# Patient Record
Sex: Female | Born: 1980 | Race: Black or African American | Hispanic: No | Marital: Single | Smoking: Current every day smoker
Health system: Southern US, Community
[De-identification: ages and names within clinical notes are randomized; demographics above are authoritative.]

## PROBLEM LIST (undated history)

## (undated) DIAGNOSIS — R279 Unspecified lack of coordination: Secondary | ICD-10-CM

## (undated) DIAGNOSIS — M48061 Spinal stenosis, lumbar region without neurogenic claudication: Secondary | ICD-10-CM

## (undated) DIAGNOSIS — F329 Major depressive disorder, single episode, unspecified: Secondary | ICD-10-CM

## (undated) DIAGNOSIS — K219 Gastro-esophageal reflux disease without esophagitis: Secondary | ICD-10-CM

## (undated) DIAGNOSIS — J984 Other disorders of lung: Secondary | ICD-10-CM

## (undated) DIAGNOSIS — R209 Unspecified disturbances of skin sensation: Secondary | ICD-10-CM

## (undated) DIAGNOSIS — G4730 Sleep apnea, unspecified: Secondary | ICD-10-CM

## (undated) DIAGNOSIS — F4310 Post-traumatic stress disorder, unspecified: Secondary | ICD-10-CM

## (undated) DIAGNOSIS — M79609 Pain in unspecified limb: Secondary | ICD-10-CM

## (undated) DIAGNOSIS — M797 Fibromyalgia: Secondary | ICD-10-CM

## (undated) DIAGNOSIS — I10 Essential (primary) hypertension: Secondary | ICD-10-CM

## (undated) DIAGNOSIS — M76899 Other specified enthesopathies of unspecified lower limb, excluding foot: Secondary | ICD-10-CM

## (undated) DIAGNOSIS — M25569 Pain in unspecified knee: Secondary | ICD-10-CM

## (undated) DIAGNOSIS — M1990 Unspecified osteoarthritis, unspecified site: Secondary | ICD-10-CM

## (undated) DIAGNOSIS — M545 Low back pain, unspecified: Secondary | ICD-10-CM

## (undated) DIAGNOSIS — J45909 Unspecified asthma, uncomplicated: Secondary | ICD-10-CM

## (undated) DIAGNOSIS — R0602 Shortness of breath: Secondary | ICD-10-CM

## (undated) DIAGNOSIS — G894 Chronic pain syndrome: Secondary | ICD-10-CM

## (undated) DIAGNOSIS — C801 Malignant (primary) neoplasm, unspecified: Secondary | ICD-10-CM

## (undated) DIAGNOSIS — E119 Type 2 diabetes mellitus without complications: Secondary | ICD-10-CM

## (undated) DIAGNOSIS — J449 Chronic obstructive pulmonary disease, unspecified: Secondary | ICD-10-CM

## (undated) DIAGNOSIS — F32A Depression, unspecified: Secondary | ICD-10-CM

## (undated) HISTORY — DX: Malignant (primary) neoplasm, unspecified: C80.1

## (undated) HISTORY — PX: MOUTH SURGERY: SHX715

## (undated) HISTORY — PX: DILATION AND CURETTAGE OF UTERUS: SHX78

## (undated) HISTORY — DX: Gastro-esophageal reflux disease without esophagitis: K21.9

## (undated) HISTORY — DX: Post-traumatic stress disorder, unspecified: F43.10

## (undated) HISTORY — DX: Sleep apnea, unspecified: G47.30

## (undated) HISTORY — DX: Fibromyalgia: M79.7

## (undated) HISTORY — DX: Pain in unspecified limb: M79.609

## (undated) HISTORY — DX: Unspecified osteoarthritis, unspecified site: M19.90

## (undated) HISTORY — DX: Essential (primary) hypertension: I10

## (undated) HISTORY — DX: Chronic pain syndrome: G89.4

## (undated) HISTORY — PX: ABDOMINAL HYSTERECTOMY: SHX81

## (undated) HISTORY — DX: Spinal stenosis, lumbar region without neurogenic claudication: M48.061

## (undated) HISTORY — DX: Pain in unspecified knee: M25.569

## (undated) HISTORY — DX: Low back pain, unspecified: M54.50

## (undated) HISTORY — DX: Major depressive disorder, single episode, unspecified: F32.9

## (undated) HISTORY — DX: Other disorders of lung: J98.4

## (undated) HISTORY — DX: Type 2 diabetes mellitus without complications: E11.9

## (undated) HISTORY — PX: OTHER SURGICAL HISTORY: SHX169

## (undated) HISTORY — DX: Unspecified lack of coordination: R27.9

## (undated) HISTORY — DX: Other specified enthesopathies of unspecified lower limb, excluding foot: M76.899

## (undated) HISTORY — DX: Depression, unspecified: F32.A

## (undated) HISTORY — DX: Unspecified disturbances of skin sensation: R20.9

## (undated) HISTORY — DX: Low back pain: M54.5

---

## 1999-02-10 ENCOUNTER — Encounter: Payer: Self-pay | Admitting: *Deleted

## 1999-02-10 ENCOUNTER — Emergency Department (HOSPITAL_COMMUNITY): Admission: EM | Admit: 1999-02-10 | Discharge: 1999-02-10 | Payer: Self-pay | Admitting: Emergency Medicine

## 2001-09-16 ENCOUNTER — Ambulatory Visit (HOSPITAL_COMMUNITY): Admission: RE | Admit: 2001-09-16 | Discharge: 2001-09-16 | Payer: Self-pay | Admitting: Internal Medicine

## 2001-09-16 ENCOUNTER — Encounter: Payer: Self-pay | Admitting: Internal Medicine

## 2001-11-05 ENCOUNTER — Emergency Department (HOSPITAL_COMMUNITY): Admission: EM | Admit: 2001-11-05 | Discharge: 2001-11-06 | Payer: Self-pay | Admitting: *Deleted

## 2005-04-15 ENCOUNTER — Emergency Department (HOSPITAL_COMMUNITY): Admission: EM | Admit: 2005-04-15 | Discharge: 2005-04-15 | Payer: Self-pay | Admitting: Emergency Medicine

## 2007-09-30 ENCOUNTER — Emergency Department (HOSPITAL_COMMUNITY): Admission: EM | Admit: 2007-09-30 | Discharge: 2007-09-30 | Payer: Self-pay | Admitting: Emergency Medicine

## 2007-11-30 ENCOUNTER — Emergency Department (HOSPITAL_COMMUNITY): Admission: EM | Admit: 2007-11-30 | Discharge: 2007-11-30 | Payer: Self-pay | Admitting: Emergency Medicine

## 2007-12-01 ENCOUNTER — Emergency Department (HOSPITAL_COMMUNITY): Admission: EM | Admit: 2007-12-01 | Discharge: 2007-12-01 | Payer: Self-pay | Admitting: Emergency Medicine

## 2008-10-05 ENCOUNTER — Emergency Department (HOSPITAL_COMMUNITY): Admission: EM | Admit: 2008-10-05 | Discharge: 2008-10-05 | Payer: Self-pay | Admitting: Emergency Medicine

## 2008-12-21 ENCOUNTER — Ambulatory Visit (HOSPITAL_COMMUNITY): Admission: RE | Admit: 2008-12-21 | Discharge: 2008-12-21 | Payer: Self-pay | Admitting: Anesthesiology

## 2008-12-22 ENCOUNTER — Ambulatory Visit (HOSPITAL_COMMUNITY): Admission: RE | Admit: 2008-12-22 | Discharge: 2008-12-22 | Payer: Self-pay | Admitting: Obstetrics and Gynecology

## 2008-12-22 ENCOUNTER — Encounter (INDEPENDENT_AMBULATORY_CARE_PROVIDER_SITE_OTHER): Payer: Self-pay | Admitting: Obstetrics and Gynecology

## 2008-12-30 ENCOUNTER — Ambulatory Visit: Payer: Self-pay | Admitting: Cardiology

## 2008-12-30 ENCOUNTER — Inpatient Hospital Stay (HOSPITAL_COMMUNITY): Admission: EM | Admit: 2008-12-30 | Discharge: 2009-01-01 | Payer: Self-pay | Admitting: Emergency Medicine

## 2009-01-01 ENCOUNTER — Encounter (INDEPENDENT_AMBULATORY_CARE_PROVIDER_SITE_OTHER): Payer: Self-pay | Admitting: Internal Medicine

## 2009-01-19 ENCOUNTER — Ambulatory Visit: Admission: RE | Admit: 2009-01-19 | Discharge: 2009-01-19 | Payer: Self-pay | Admitting: Gynecology

## 2009-01-26 ENCOUNTER — Emergency Department (HOSPITAL_COMMUNITY): Admission: EM | Admit: 2009-01-26 | Discharge: 2009-01-26 | Payer: Self-pay | Admitting: Emergency Medicine

## 2009-01-29 DIAGNOSIS — R079 Chest pain, unspecified: Secondary | ICD-10-CM

## 2009-01-29 DIAGNOSIS — Z87898 Personal history of other specified conditions: Secondary | ICD-10-CM | POA: Insufficient documentation

## 2009-01-29 DIAGNOSIS — I10 Essential (primary) hypertension: Secondary | ICD-10-CM | POA: Insufficient documentation

## 2009-02-10 ENCOUNTER — Ambulatory Visit: Admission: RE | Admit: 2009-02-10 | Discharge: 2009-02-10 | Payer: Self-pay | Admitting: Internal Medicine

## 2009-02-20 ENCOUNTER — Ambulatory Visit: Payer: Self-pay | Admitting: Cardiology

## 2009-02-20 DIAGNOSIS — G4710 Hypersomnia, unspecified: Secondary | ICD-10-CM | POA: Insufficient documentation

## 2009-02-20 DIAGNOSIS — G4730 Sleep apnea, unspecified: Secondary | ICD-10-CM

## 2009-03-05 ENCOUNTER — Ambulatory Visit (HOSPITAL_COMMUNITY): Admission: RE | Admit: 2009-03-05 | Discharge: 2009-03-05 | Payer: Self-pay | Admitting: Internal Medicine

## 2009-03-26 ENCOUNTER — Encounter (HOSPITAL_COMMUNITY): Admission: RE | Admit: 2009-03-26 | Discharge: 2009-04-25 | Payer: Self-pay | Admitting: Orthopaedic Surgery

## 2009-04-26 ENCOUNTER — Encounter (HOSPITAL_COMMUNITY): Admission: RE | Admit: 2009-04-26 | Discharge: 2009-05-26 | Payer: Self-pay | Admitting: Orthopaedic Surgery

## 2009-05-11 ENCOUNTER — Encounter (INDEPENDENT_AMBULATORY_CARE_PROVIDER_SITE_OTHER): Payer: Self-pay | Admitting: *Deleted

## 2009-05-11 LAB — CONVERTED CEMR LAB
HDL: 44 mg/dL
Triglycerides: 202 mg/dL

## 2009-05-30 ENCOUNTER — Encounter (HOSPITAL_COMMUNITY): Admission: RE | Admit: 2009-05-30 | Discharge: 2009-06-15 | Payer: Self-pay | Admitting: Orthopaedic Surgery

## 2009-08-08 ENCOUNTER — Emergency Department (HOSPITAL_COMMUNITY): Admission: EM | Admit: 2009-08-08 | Discharge: 2009-08-08 | Payer: Self-pay | Admitting: Emergency Medicine

## 2009-09-14 DIAGNOSIS — C801 Malignant (primary) neoplasm, unspecified: Secondary | ICD-10-CM

## 2009-09-14 HISTORY — DX: Malignant (primary) neoplasm, unspecified: C80.1

## 2009-09-25 ENCOUNTER — Encounter (INDEPENDENT_AMBULATORY_CARE_PROVIDER_SITE_OTHER): Payer: Self-pay | Admitting: *Deleted

## 2009-10-18 ENCOUNTER — Emergency Department (HOSPITAL_COMMUNITY): Admission: EM | Admit: 2009-10-18 | Discharge: 2009-10-19 | Payer: Self-pay | Admitting: Emergency Medicine

## 2009-11-01 ENCOUNTER — Ambulatory Visit (HOSPITAL_COMMUNITY): Payer: Self-pay | Admitting: Psychiatry

## 2009-11-14 ENCOUNTER — Ambulatory Visit (HOSPITAL_COMMUNITY): Payer: Self-pay | Admitting: Psychiatry

## 2009-11-19 ENCOUNTER — Ambulatory Visit (HOSPITAL_COMMUNITY): Payer: Self-pay | Admitting: Psychiatry

## 2009-11-22 ENCOUNTER — Ambulatory Visit (HOSPITAL_COMMUNITY): Payer: Self-pay | Admitting: Psychiatry

## 2009-11-26 ENCOUNTER — Ambulatory Visit (HOSPITAL_COMMUNITY): Payer: Self-pay | Admitting: Psychiatry

## 2009-12-04 ENCOUNTER — Ambulatory Visit (HOSPITAL_COMMUNITY): Payer: Self-pay | Admitting: Psychiatry

## 2009-12-13 ENCOUNTER — Encounter (INDEPENDENT_AMBULATORY_CARE_PROVIDER_SITE_OTHER): Payer: Self-pay | Admitting: *Deleted

## 2009-12-13 LAB — CONVERTED CEMR LAB
ALT: 18 units/L
AST: 20 units/L
Albumin: 3.9 g/dL
Alkaline Phosphatase: 50 units/L
Chloride: 99 meq/L
Cholesterol: 270 mg/dL
Creatinine, Ser: 0.85 mg/dL

## 2009-12-19 ENCOUNTER — Ambulatory Visit (HOSPITAL_COMMUNITY): Payer: Self-pay | Admitting: Psychiatry

## 2009-12-20 ENCOUNTER — Ambulatory Visit (HOSPITAL_COMMUNITY): Payer: Self-pay | Admitting: Psychiatry

## 2009-12-21 ENCOUNTER — Encounter (INDEPENDENT_AMBULATORY_CARE_PROVIDER_SITE_OTHER): Payer: Self-pay | Admitting: *Deleted

## 2009-12-28 ENCOUNTER — Ambulatory Visit (HOSPITAL_COMMUNITY): Payer: Self-pay | Admitting: Psychiatry

## 2009-12-31 ENCOUNTER — Ambulatory Visit (HOSPITAL_COMMUNITY): Payer: Self-pay | Admitting: Psychiatry

## 2010-01-05 ENCOUNTER — Emergency Department (HOSPITAL_COMMUNITY): Admission: EM | Admit: 2010-01-05 | Discharge: 2010-01-06 | Payer: Self-pay | Admitting: Emergency Medicine

## 2010-01-08 ENCOUNTER — Ambulatory Visit: Payer: Self-pay | Admitting: Cardiology

## 2010-01-08 ENCOUNTER — Ambulatory Visit (HOSPITAL_COMMUNITY): Admission: RE | Admit: 2010-01-08 | Discharge: 2010-01-08 | Payer: Self-pay | Admitting: Cardiology

## 2010-01-08 DIAGNOSIS — C55 Malignant neoplasm of uterus, part unspecified: Secondary | ICD-10-CM

## 2010-01-08 DIAGNOSIS — M542 Cervicalgia: Secondary | ICD-10-CM | POA: Insufficient documentation

## 2010-01-08 DIAGNOSIS — IMO0001 Reserved for inherently not codable concepts without codable children: Secondary | ICD-10-CM | POA: Insufficient documentation

## 2010-01-09 ENCOUNTER — Telehealth (INDEPENDENT_AMBULATORY_CARE_PROVIDER_SITE_OTHER): Payer: Self-pay | Admitting: *Deleted

## 2010-01-09 ENCOUNTER — Encounter: Payer: Self-pay | Admitting: Cardiology

## 2010-01-14 ENCOUNTER — Encounter
Admission: RE | Admit: 2010-01-14 | Discharge: 2010-04-14 | Payer: Self-pay | Admitting: Physical Medicine and Rehabilitation

## 2010-01-16 ENCOUNTER — Ambulatory Visit (HOSPITAL_COMMUNITY): Payer: Self-pay | Admitting: Psychiatry

## 2010-01-17 ENCOUNTER — Ambulatory Visit (HOSPITAL_COMMUNITY): Payer: Self-pay | Admitting: Psychiatry

## 2010-01-21 ENCOUNTER — Ambulatory Visit: Payer: Self-pay | Admitting: Physical Medicine and Rehabilitation

## 2010-01-23 ENCOUNTER — Encounter (INDEPENDENT_AMBULATORY_CARE_PROVIDER_SITE_OTHER): Payer: Self-pay | Admitting: *Deleted

## 2010-01-23 ENCOUNTER — Emergency Department (HOSPITAL_COMMUNITY): Admission: EM | Admit: 2010-01-23 | Discharge: 2010-01-23 | Payer: Self-pay | Admitting: Emergency Medicine

## 2010-01-23 ENCOUNTER — Telehealth (INDEPENDENT_AMBULATORY_CARE_PROVIDER_SITE_OTHER): Payer: Self-pay | Admitting: *Deleted

## 2010-01-23 LAB — CONVERTED CEMR LAB
BUN: 7 mg/dL
CK-MB: <1 ng/mL
CO2: 32 meq/L
Calcium: 0.92 mg/dL
Glomerular Filtration Rate, Af Am: >60 mL/min/{1.73_m2}
Glucose, Bld: 141 mg/dL
MCV: 87.1 fL
Platelets: 359 10*3/uL
Potassium: 3.5 meq/L
Sodium: 139 meq/L
Troponin I: <0.05 ng/mL

## 2010-01-24 ENCOUNTER — Ambulatory Visit (HOSPITAL_COMMUNITY): Payer: Self-pay | Admitting: Psychiatry

## 2010-01-29 ENCOUNTER — Ambulatory Visit (HOSPITAL_COMMUNITY): Admission: RE | Admit: 2010-01-29 | Discharge: 2010-01-29 | Payer: Self-pay

## 2010-01-31 ENCOUNTER — Ambulatory Visit (HOSPITAL_COMMUNITY): Payer: Self-pay | Admitting: Psychiatry

## 2010-02-01 ENCOUNTER — Ambulatory Visit (HOSPITAL_COMMUNITY): Payer: Self-pay | Admitting: Psychiatry

## 2010-02-05 ENCOUNTER — Encounter: Admission: RE | Admit: 2010-02-05 | Discharge: 2010-05-06 | Payer: Self-pay | Admitting: Internal Medicine

## 2010-02-12 ENCOUNTER — Ambulatory Visit (HOSPITAL_COMMUNITY): Payer: Self-pay | Admitting: Psychiatry

## 2010-02-13 ENCOUNTER — Encounter (INDEPENDENT_AMBULATORY_CARE_PROVIDER_SITE_OTHER): Payer: Self-pay | Admitting: *Deleted

## 2010-02-15 ENCOUNTER — Ambulatory Visit: Payer: Self-pay | Admitting: Physical Medicine and Rehabilitation

## 2010-02-26 ENCOUNTER — Ambulatory Visit (HOSPITAL_COMMUNITY): Payer: Self-pay | Admitting: Psychiatry

## 2010-02-28 ENCOUNTER — Ambulatory Visit (HOSPITAL_COMMUNITY): Payer: Self-pay | Admitting: Psychiatry

## 2010-03-05 ENCOUNTER — Encounter (INDEPENDENT_AMBULATORY_CARE_PROVIDER_SITE_OTHER): Payer: Self-pay | Admitting: *Deleted

## 2010-03-05 LAB — CONVERTED CEMR LAB: Hgb A1c MFr Bld: 6.4 %

## 2010-03-06 ENCOUNTER — Ambulatory Visit (HOSPITAL_COMMUNITY): Payer: Self-pay | Admitting: Psychiatry

## 2010-03-07 ENCOUNTER — Ambulatory Visit (HOSPITAL_COMMUNITY): Admission: RE | Admit: 2010-03-07 | Discharge: 2010-03-07 | Payer: Self-pay | Admitting: Internal Medicine

## 2010-03-12 ENCOUNTER — Observation Stay (HOSPITAL_COMMUNITY): Admission: EM | Admit: 2010-03-12 | Discharge: 2010-03-13 | Payer: Self-pay | Admitting: Emergency Medicine

## 2010-03-12 ENCOUNTER — Ambulatory Visit: Payer: Self-pay | Admitting: Cardiology

## 2010-03-13 ENCOUNTER — Ambulatory Visit: Payer: Self-pay | Admitting: Vascular Surgery

## 2010-03-13 ENCOUNTER — Encounter (INDEPENDENT_AMBULATORY_CARE_PROVIDER_SITE_OTHER): Payer: Self-pay | Admitting: Internal Medicine

## 2010-03-13 LAB — CONVERTED CEMR LAB: Hgb A1c MFr Bld: 6.8 %

## 2010-03-21 ENCOUNTER — Ambulatory Visit (HOSPITAL_COMMUNITY): Payer: Self-pay | Admitting: Psychiatry

## 2010-03-26 ENCOUNTER — Ambulatory Visit: Payer: Self-pay | Admitting: Cardiology

## 2010-03-26 ENCOUNTER — Ambulatory Visit (HOSPITAL_COMMUNITY): Payer: Self-pay | Admitting: Psychiatry

## 2010-03-26 DIAGNOSIS — Z87891 Personal history of nicotine dependence: Secondary | ICD-10-CM

## 2010-03-28 ENCOUNTER — Ambulatory Visit (HOSPITAL_COMMUNITY): Payer: Self-pay | Admitting: Psychiatry

## 2010-04-02 ENCOUNTER — Ambulatory Visit (HOSPITAL_COMMUNITY): Admission: RE | Admit: 2010-04-02 | Discharge: 2010-04-02 | Payer: Self-pay | Admitting: Obstetrics and Gynecology

## 2010-04-03 ENCOUNTER — Ambulatory Visit: Payer: Self-pay | Admitting: Physical Medicine and Rehabilitation

## 2010-04-04 ENCOUNTER — Ambulatory Visit (HOSPITAL_COMMUNITY): Payer: Self-pay | Admitting: Psychiatry

## 2010-04-11 ENCOUNTER — Ambulatory Visit (HOSPITAL_COMMUNITY)
Admission: RE | Admit: 2010-04-11 | Discharge: 2010-04-11 | Payer: Self-pay | Admitting: Physical Medicine and Rehabilitation

## 2010-04-26 ENCOUNTER — Ambulatory Visit (HOSPITAL_COMMUNITY): Payer: Self-pay | Admitting: Psychiatry

## 2010-04-26 ENCOUNTER — Encounter
Admission: RE | Admit: 2010-04-26 | Discharge: 2010-06-13 | Payer: Self-pay | Source: Home / Self Care | Attending: Physical Medicine and Rehabilitation | Admitting: Physical Medicine and Rehabilitation

## 2010-05-06 ENCOUNTER — Ambulatory Visit: Payer: Self-pay | Admitting: Physical Medicine and Rehabilitation

## 2010-05-13 ENCOUNTER — Ambulatory Visit (HOSPITAL_COMMUNITY): Payer: Self-pay | Admitting: Psychiatry

## 2010-05-23 ENCOUNTER — Ambulatory Visit (HOSPITAL_COMMUNITY): Payer: Self-pay | Admitting: Psychiatry

## 2010-05-27 ENCOUNTER — Ambulatory Visit (HOSPITAL_COMMUNITY): Payer: Self-pay | Admitting: Psychiatry

## 2010-05-29 ENCOUNTER — Encounter (INDEPENDENT_AMBULATORY_CARE_PROVIDER_SITE_OTHER): Payer: Self-pay | Admitting: *Deleted

## 2010-06-03 ENCOUNTER — Ambulatory Visit (HOSPITAL_COMMUNITY): Payer: Self-pay | Admitting: Psychiatry

## 2010-06-03 ENCOUNTER — Ambulatory Visit: Payer: Self-pay | Admitting: Cardiology

## 2010-06-12 ENCOUNTER — Ambulatory Visit (HOSPITAL_COMMUNITY): Payer: Self-pay | Admitting: Psychiatry

## 2010-06-18 ENCOUNTER — Ambulatory Visit (HOSPITAL_COMMUNITY)
Admission: RE | Admit: 2010-06-18 | Discharge: 2010-06-18 | Payer: Self-pay | Source: Home / Self Care | Attending: Psychiatry | Admitting: Psychiatry

## 2010-06-18 ENCOUNTER — Encounter
Admission: RE | Admit: 2010-06-18 | Discharge: 2010-06-21 | Payer: Self-pay | Source: Home / Self Care | Attending: Physical Medicine and Rehabilitation | Admitting: Physical Medicine and Rehabilitation

## 2010-06-19 ENCOUNTER — Encounter: Admit: 2010-06-19 | Payer: Self-pay | Admitting: Internal Medicine

## 2010-06-21 ENCOUNTER — Ambulatory Visit
Admission: RE | Admit: 2010-06-21 | Discharge: 2010-06-21 | Payer: Self-pay | Source: Home / Self Care | Attending: Physical Medicine and Rehabilitation | Admitting: Physical Medicine and Rehabilitation

## 2010-06-26 ENCOUNTER — Ambulatory Visit (HOSPITAL_COMMUNITY): Admit: 2010-06-26 | Payer: Self-pay | Admitting: Psychiatry

## 2010-07-03 ENCOUNTER — Ambulatory Visit (HOSPITAL_COMMUNITY)
Admission: RE | Admit: 2010-07-03 | Discharge: 2010-07-03 | Payer: Self-pay | Source: Home / Self Care | Attending: Psychiatry | Admitting: Psychiatry

## 2010-07-08 LAB — POCT I-STAT, CHEM 8
Chloride: 99 mEq/L (ref 96–112)
Potassium: 3.3 mEq/L — ABNORMAL LOW (ref 3.5–5.1)
Sodium: 140 mEq/L (ref 135–145)
TCO2: 32 mmol/L (ref 0–100)

## 2010-07-09 ENCOUNTER — Ambulatory Visit
Admission: RE | Admit: 2010-07-09 | Discharge: 2010-07-09 | Payer: Self-pay | Source: Home / Self Care | Attending: Orthopedic Surgery | Admitting: Orthopedic Surgery

## 2010-07-10 ENCOUNTER — Ambulatory Visit (HOSPITAL_COMMUNITY)
Admission: RE | Admit: 2010-07-10 | Discharge: 2010-07-10 | Payer: Self-pay | Source: Home / Self Care | Attending: Psychiatry | Admitting: Psychiatry

## 2010-07-16 ENCOUNTER — Ambulatory Visit (HOSPITAL_COMMUNITY)
Admission: RE | Admit: 2010-07-16 | Discharge: 2010-07-16 | Payer: Self-pay | Source: Home / Self Care | Attending: Psychiatry | Admitting: Psychiatry

## 2010-07-16 NOTE — Progress Notes (Signed)
Summary: TEST RESULTS  Phone Note Call from Patient Call back at [phone]   Caller: PT Reason for Call: Lab or Test Results Summary of Call: PT WAS CALLING JUST TO LET STAFF KNOW WHEN THEY CALL WITH TEST RESULT TO PLEASE CALL CELL NUMBER [phone]. Initial call taken by: Faythe Ghee,  January 09, 2010 9:22 AM  Follow-up for Phone Call        I called results of cervical xrays to pt, verbalized understanding Follow-up by: Teressa Lower RN,  January 10, 2010 10:49 AM

## 2010-07-16 NOTE — Miscellaneous (Signed)
Summary: cmp,lipids per Dr. Ouida Sills  Clinical Lists Changes  Observations: Added new observation of CALCIUM: 9.7 mg/dL (16/03/9603 54:09) Added new observation of ALBUMIN: 3.9 g/dL (81/19/1478 29:56) Added new observation of PROTEIN, TOT: 7.1 g/dL (21/30/8657 84:69) Added new observation of SGPT (ALT): 18 units/L (12/13/2009 14:51) Added new observation of SGOT (AST): 20 units/L (12/13/2009 14:51) Added new observation of ALK PHOS: 50 units/L (12/13/2009 14:51) Added new observation of BILI DIRECT: total bili  0.5 mg/dL (62/95/2841 32:44) Added new observation of CREATININE: 0.85 mg/dL (06/18/7251 66:44) Added new observation of BUN: 14 mg/dL (03/47/4259 56:38) Added new observation of BG RANDOM: 133 mg/dL (75/64/3329 51:88) Added new observation of CO2 PLSM/SER: 28 meq/L (12/13/2009 14:51) Added new observation of CL SERUM: 99 meq/L (12/13/2009 14:51) Added new observation of K SERUM: 4.0 meq/L (12/13/2009 14:51) Added new observation of NA: 141 meq/L (12/13/2009 14:51) Added new observation of LDL: 183 mg/dL (41/66/0630 16:01) Added new observation of HDL: 40 mg/dL (09/32/3557 32:20) Added new observation of TRIGLYC TOT: 233 mg/dL (25/42/7062 37:62) Added new observation of CHOLESTEROL: 270 mg/dL (83/15/1761 60:73)

## 2010-07-16 NOTE — Assessment & Plan Note (Signed)
Summary: rov   Visit Type:  Follow-up Primary Provider:  Osborne Casco   History of Present Illness: [name] returns to the office for continuing assessment and treatment of chest pain.  She has noted some improvement with sublingual nitroglycerin, but presented to the [facility] Emergency Department one evening while visiting a hospitalized patient.  She had been experiencing chest discomfort on and off all day that was responsive to nitroglycerin and decided that while she was in the hospital she would have it checked out.  Hospital records were obtained and reviewed.  Deep vein thrombosis, pulmonary embolism and acute MI were ruled out, and she was discharged within 24 hours.  -  Date:  03/13/2010    Cholesterol: 202    LDL: 140    HDL: 38    Triglycerides: 528    HgbA1c: 6.8    TSH: 1.4   Current Medications (verified): 1)  Chlorthalidone 25 Mg Tabs (Chlorthalidone) .... Take 1 Tab Daily 2)  Metoprolol Tartrate 50 Mg Tabs (Metoprolol Tartrate) .... Twice A Day 3)  Fluoxetine Hcl 40 Mg Caps (Fluoxetine Hcl) .... Take 1 Tab Daily 4)  Klor-Con M20 20 Meq Cr-Tabs (Potassium Chloride Crys Cr) .... Take 1 Tab Two Times A Day 5)  Abilify 10 Mg Tabs (Aripiprazole) .... Take 1 1/2 Tabs Daily 6)  Pravastatin Sodium 20 Mg Tabs (Pravastatin Sodium) .... Take 1 Tab Daily 7)  Nitrostat 0.4 Mg Subl (Nitroglycerin) .Marland Kitchen.. 1 Tablet Under Tongue At Onset of Chest Pain; You May Repeat Every 5 Minutes For Up To 3 Doses. 8)  Premarin 0.625 Mg Tabs (Estrogens Conjugated) .... Take 1 Tab Daily 9)  Nitroglycerin 0.4 Mg/hr Pt24 (Nitroglycerin) .... Appy Every Am and Remove Every Pm  Allergies (verified): 1)  ! Codeine 2)  ! * Noraflex  Comments:  Nurse/Medical Assistant: patient brought meds and reviewed previous med list from  last ov also abilify has been changed from 5 mg to 15mg  per Dr.Roy fagan also potassium is 2 tabs daily  per fagan   Past History:  PMH, FH, and Social History  reviewed and updated.  Review of Systems       See HPI.  Vital Signs:  Patient profile:   30 year old female Weight:      332 pounds BMI:     60.94 Pulse rate:   77 / minute BP sitting:   117 / 81  (right arm)  Vitals Entered By: Dreama Saa, CNA (March 26, 2010 2:44 PM)  Physical Exam  General:  Obese; well developed; no acute distress:   Neck-No JVD; no carotid bruits: Lungs-No tachypnea, no rales; no rhonchi; no wheezes: Cardiovascular-distant S1 and S2; minimal systolic murmur Abdomen-BS normal; soft and non-tender without masses or organomegaly:  Musculoskeletal-No deformities, no cyanosis or clubbing: Neurologic-Normal cranial nerves; symmetric strength and tone:  Skin-Warm, no significant lesions: Extremities-Nl distal pulses; no edema:     Impression & Recommendations:  Problem # 1:  CHEST PAIN (ICD-786.50) Symptoms are somewhat improved with nitrates, although she still ended up in hospital.  D-dimer was 0.6 with a normal CT angiogram of the chest.  Cardiac markers and EKGs were negative.  She noted excellent relief when transderma lNTG was applied at [facility].  This medicine will be added to her regime at a dose of 0.4 mg per hour with the system to be applied in the morning and removed at night.  She will be permitted to use sublingual nitroglycerin as well.  Problem # 2:  HYPERLIPIDEMIA, MILD, HX OF (ICD-V13.8) Lipid profile in the hospital showed fairly good, if not optimal, control of hyperlipidemia.  Current therapy will be continued.  Problem # 3:  HYPERTENSION (ICD-401.9) Blood pressure control is good with her current simple 2 drug regimen, which will be continued.  I will reassess this nice woman in 2 months.  Problem # 4:  TOBACCO ABUSE (ICD-305.1) She was given a transdermal nicotine patch in the hospital, and has continued that while refraining from smoking over the past few days.  She is encouraged to continue this.  Patient  Instructions: 1)  Your physician recommends that you schedule a follow-up appointment in: 2 MONTHS 2)  Your physician has recommended you make the following change in your medication: NITROGLYCERIN TRANSDERMAL APPLY PATCH IN AM AND REMOVE IN PM   MAY USE NITROGLYCERIN UNDER TONGUE IN ADDITION TO PATCH Prescriptions: NITROGLYCERIN 0.4 MG/HR PT24 (NITROGLYCERIN) APPY EVERY AM AND REMOVE EVERY PM  #30 x 3   Entered by:   Teressa Lower RN   Authorized by:   Kathlen Brunswick, MD, [facility]   Signed by:   Teressa Lower RN on 03/26/2010   Method used:   Electronically to        [facility] [phone]* (retail)       [address]       [place], [place]  [address]       Ph: [phone] or [phone]       Fax: [phone]   RxID:   [id number]

## 2010-07-16 NOTE — Progress Notes (Signed)
Summary: phone note about nitro issues  Phone Note Call from Patient   Caller: Patient Summary of Call: Patient called this morning 01/23/2010 at 8:20 am to let Dr.Rothbart know that she had to use nitro 3 times last night.  I informed her that Dr.Rothbart was not in the office today and asked her to please go to the emergency room to be evaluated due to the fact that there  might be something really serious going on.  She stated last time she had to take nitro she went to the ED, but they didn't  find anything.  I still informed her to go to the ED. Initial call taken by: Dreama Saa, CNA,  January 23, 2010 8:25 AM  Follow-up for Phone Call        Pt is in the ED at this time will get records after visit Follow-up by: Teressa Lower RN,  January 23, 2010 12:07 PM  Additional Follow-up for Phone Call Additional follow up Details #1::        S: seen in ED for chest pain 01/23/10 B: seen in office on 01/08/10, stopped asa and famotidine,ntg as needed chest pain naproxen for back pain A: poc cardiac enzymes normal labs normal,  R: discharged with musculoskeletal chest pain Additional Follow-up by: Teressa Lower RN,  January 23, 2010 4:11 PM    Additional Follow-up for Phone Call Additional follow up Details #2::    Noted. Follow-up by: Kathlen Brunswick, MD, [facility],  January 27, 2010 3:18 PM    CXR  Procedure date:  01/23/2010  Findings:       IMPRESSION:   No acute abnormalities.    Read By:  Lollie Marrow,  Judie Petit.D.

## 2010-07-16 NOTE — Miscellaneous (Signed)
Summary: LABS LIPIDS,05/11/2009  Clinical Lists Changes  Observations: Added new observation of ALK PHOS: 17 units/L (05/11/2009 10:34) Added new observation of LDL: 157 mg/dL (19/14/7829 56:21) Added new observation of HDL: 44 mg/dL (30/86/5784 69:62) Added new observation of TRIGLYC TOT: 202 mg/dL (95/28/4132 44:01) Added new observation of CHOLESTEROL: 241 mg/dL (02/72/5366 44:03)

## 2010-07-16 NOTE — Assessment & Plan Note (Signed)
Summary: 6 mth f/u per checkout on 02/20/09/tg   Visit Type:  Follow-up Primary Provider:  Dr.Roy Fagen   History of Present Illness: [name] returns to the office as scheduled for continued assessment and treatment of chest discomfort.  Incidentally, she was seen in the emergency department yesterday for chest pain, dyspnea, dizziness and nausea.  Usual testing revealed only a potassium of 2.9.  Some replacement was performed in the Emergency Department and a potassium supplement was added to her medical regime.  She has been seen in the emergency department on a number of occasions and continues to have frequent, if not constant, chest discomfort.  She identifies localized pain at the left costal margin, but has associated left shoulder, upper back and posterior neck pain.  She describes continuing problems with diffuse pain attributed to fibromyalgia.  She is to be evaluated by a local pain clinic.  Additional history was obtained in a telephone call to Dr. Ouida Sills.  Patient has been treated with a number of nonsteroidals without benefit as well as a variety of other medications.  Most recently, Lyrica has been prescribed, but not yet approved by her insurance company.  She has also recently undergone a sleep study, which revealed mild sleep apnea.  Preventive Screening-Counseling & Management  Alcohol-Tobacco     Smoking Status: current     Smoking Cessation Counseling: yes  Current Medications (verified): 1)  Chlorthalidone 25 Mg Tabs (Chlorthalidone) .... Take 1 Tab Daily 2)  Metoprolol Tartrate 50 Mg Tabs (Metoprolol Tartrate) .... Twice A Day 3)  Fluoxetine Hcl 40 Mg Caps (Fluoxetine Hcl) .... Take 1 Tab Daily 4)  Klor-Con M20 20 Meq Cr-Tabs (Potassium Chloride Crys Cr) .... Take 1 Tab Two Times A Day 5)  Abilify 5 Mg Tabs (Aripiprazole) .... Take 1 Tab Daily 6)  Pravastatin Sodium 20 Mg Tabs (Pravastatin Sodium) .... Take 1 Tab Daily 7)  Naproxen 375 Mg Tabs (Naproxen) ....  Take 1 Tablet By Mouth Three Times A Day As Needed Back and Chest Pain 8)  Nitrostat 0.4 Mg Subl (Nitroglycerin) .Marland Kitchen.. 1 Tablet Under Tongue At Onset of Chest Pain; You May Repeat Every 5 Minutes For Up To 3 Doses.  Allergies (verified): 1)  ! Codeine 2)  ! * Noraflex  Past History:  PMH, FH, and Social History reviewed and updated.  Past Medical History: Chest pain HYPERLIPIDEMIA-LDL of 183 in 11/2009 HYPERTENSION (ICD-401.9) Uterine carcinoma-total abdominal hysterectomy in 10/2009  Past Surgical History: D&C and hysteroscopy by Dr.Holland for abnormal bleeding with endometrial polyps 12/22/2008 Arthroscopic knee surgery Total abdominal hysterectomy for uterine carcinoma  Social History: Smoking Status:  current  Review of Systems       See history of present illness.  Patient is no longer experiencing menstrual periods, as she underwent total hysterectomy 2 months ago.  Vital Signs:  Patient profile:   30 year old female Weight:      322 pounds BMI:     59.11 O2 Sat:      95 % on Room air Pulse rate:   99 / minute BP sitting:   132 / 89  (right arm)  Vitals Entered By: Dreama Saa, CNA (January 08, 2010 11:09 AM)  O2 Flow:  Room air  Physical Exam  General:  Overweight; well developed; no acute distress:   Neck-No JVD; no carotid bruits: Lungs-No tachypnea, no rales; no rhonchi; no wheezes: Cardiovascular-normal PMI; normal S1 and S2; mother systolic murmur Abdomen-BS normal; soft and non-tender without masses or  organomegaly:  Musculoskeletal-No deformities, no cyanosis or clubbing: Neurologic-Normal cranial nerves; symmetric strength and tone:  Skin-Warm, no significant lesions: Extremities-Nl distal pulses; no edema:     EKG  Procedure date:  01/08/2010  Findings:      Sinus tachycardia at a rate of 105 bpm Right ventricular conduction delay Nonspecific ST-T wave abnormality; cannot exclude inferior ischemia.   Impression &  Recommendations:  Problem # 1:  CHEST PAIN (ICD-786.50) Chest pain persists, but does not appear to be cardiac.  At this visit, her symptoms seem most compatible with back pain of thoracic or cervical spine origin.  Cervical spine films will be obtained and treatment with naproxen initiated.  She is accumulating multiple medications that are providing her with questionable benefit.  Aspirin and famotidine will be discontinued.  She will be provided with sublingual nitroglycerin the way of a therapeutic trial.  I will reassess this nice woman in 6 weeks.  Other Orders: T-Cervical Spine Comp 4 Views [phone])  Patient Instructions: 1)  Your physician recommends that you schedule a follow-up appointment in: 6 weeks 2)  Your physician has recommended you make the following change in your medication: stop aspirin and famotidine 3)  nitroglycerin 0.4mg  under tongue every 5 min x 3 for chest pain and naproxen 375 Take 1 tablet by mouth three times a day 4)  as needed for back and chest pain 5)  cervical spine xrays Prescriptions: NITROSTAT 0.4 MG SUBL (NITROGLYCERIN) 1 tablet under tongue at onset of chest pain; you may repeat every 5 minutes for up to 3 doses.  #25 x 3   Entered by:   Teressa Lower RN   Authorized by:   Kathlen Brunswick, MD, [facility]   Signed by:   Teressa Lower RN on 01/08/2010   Method used:   Print then Give to Patient   RxID:   [id number] NAPROXEN 375 MG TABS (NAPROXEN) Take 1 tablet by mouth three times a day as needed back and chest pain  #90 x 3   Entered by:   Teressa Lower RN   Authorized by:   Kathlen Brunswick, MD, [facility]   Signed by:   Teressa Lower RN on 01/08/2010   Method used:   Print then Give to Patient   RxID:   [id number]   Prevention & Chronic Care Immunizations   Influenza vaccine: Not documented    Tetanus booster: Not documented    Pneumococcal vaccine: Not documented  Other Screening   Pap smear: Not documented   Smoking status:  current  (01/08/2010)   Smoking cessation counseling: yes  (01/08/2010)    Screening comments: 1/2 pack weekly  Hypertension   Last Blood Pressure: 132 / 89  (01/08/2010)   Serum creatinine: 0.85  (12/13/2009)   Serum potassium 4.0  (12/13/2009)  Self-Management Support :    Hypertension self-management support: Not documented

## 2010-07-16 NOTE — Miscellaneous (Signed)
Summary: CHEST XRAY 01/23/2010  Clinical Lists Changes  Observations: Added new observation of CXR RESULTS:  Clinical Data: Chest and right arm pain, hypertension, diabetes,   smoker    PORTABLE CHEST - 1 VIEW    Comparison: Portable exam 0945 hours compared to 01/05/2010    Findings:   Normal heart size, mediastinal contours, and pulmonary vascularity.   Lungs clear.   No pleural effusion or pneumothorax.   Bones unremarkable.    IMPRESSION:   No acute abnormalities.    Read By:  Lollie Marrow,  M.D.   Released By:  Lollie Marrow,  M.D.  Additional Information (01/23/2010 13:09)      CXR  Procedure date:  01/23/2010  Findings:       Clinical Data: Chest and right arm pain, hypertension, diabetes,   smoker    PORTABLE CHEST - 1 VIEW    Comparison: Portable exam 0945 hours compared to 01/05/2010    Findings:   Normal heart size, mediastinal contours, and pulmonary vascularity.   Lungs clear.   No pleural effusion or pneumothorax.   Bones unremarkable.    IMPRESSION:   No acute abnormalities.    Read By:  Lollie Marrow,  M.D.   Released By:  Lollie Marrow,  M.D.  Additional Information

## 2010-07-18 ENCOUNTER — Ambulatory Visit (HOSPITAL_COMMUNITY): Admit: 2010-07-18 | Payer: Self-pay | Admitting: Psychiatry

## 2010-07-18 ENCOUNTER — Encounter (INDEPENDENT_AMBULATORY_CARE_PROVIDER_SITE_OTHER): Payer: Medicare Other | Admitting: Psychiatry

## 2010-07-18 DIAGNOSIS — F39 Unspecified mood [affective] disorder: Secondary | ICD-10-CM

## 2010-07-18 NOTE — Assessment & Plan Note (Signed)
Summary: 2 mth f/u per checkout on 03/26/10/tg   Visit Type:  Follow-up Primary Provider:  Osborne Casco   History of Present Illness: [name] returns to the office for continued assessment and treatment of chest discomfort.  Since her last visit, she has been seeing a pain management specialist with good results.  She notes only occasional chest pain, frequency and intensity of which has been much less since she started using transdermal nitroglycerin.  Sublingual nitroglycerin has provided relief for any breakthrough symptoms.  Current Medications (verified): 1)  Chlorthalidone 25 Mg Tabs (Chlorthalidone) .... Take 1 Tab Daily 2)  Metoprolol Tartrate 50 Mg Tabs (Metoprolol Tartrate) .... Twice A Day 3)  Fluoxetine Hcl 20 Mg Caps (Fluoxetine Hcl) .... Take 1 Tab Three Times A Day 4)  Klor-Con M20 20 Meq Cr-Tabs (Potassium Chloride Crys Cr) .... Take 1 Tab Two Times A Day 5)  Abilify 15 Mg Tabs (Aripiprazole) .... Take 1 Tab At Bedtime 6)  Pravastatin Sodium 20 Mg Tabs (Pravastatin Sodium) .... Take 1 Tab Daily 7)  Nitrostat 0.4 Mg Subl (Nitroglycerin) .Marland Kitchen.. 1 Tablet Under Tongue At Onset of Chest Pain; You May Repeat Every 5 Minutes For Up To 3 Doses. 8)  Premarin 0.625 Mg Tabs (Estrogens Conjugated) .... Take 1 Tab Daily 9)  Nitroglycerin 0.4 Mg/hr Pt24 (Nitroglycerin) .... Appy Every Am and Remove Every Pm 10)  Gabapentin 300 Mg Caps (Gabapentin) .... Take 1 Tab Qid  Allergies (verified): 1)  ! Codeine 2)  ! * Noraflex  Comments:  Nurse/Medical Assistant: patient brought med list reviewed with patient   Vital Signs:  Patient profile:   30 year old female Weight:      335 pounds BMI:     61.49 O2 Sat:      95 % on Room air Pulse rate:   86 / minute BP sitting:   126 / 86  (right arm)  Vitals Entered By: Dreama Saa, CNA (June 19, 2010 2:39 PM)  O2 Flow:  Room air  Physical Exam  General:  Obese; well developed; no acute distress:   Neck-No JVD; no carotid  bruits: Lungs-No tachypnea, no rales; no rhonchi; no wheezes: Cardiovascular-very distant S1 and S2; minimal systolic murmur Abdomen-BS normal; soft and non-tender without masses or organomegaly:  Musculoskeletal-No deformities, no cyanosis or clubbing: Neurologic-Normal cranial nerves; symmetric strength and tone:  Skin-Warm, no significant lesions: Extremities-Nl distal pulses; no edema:     Impression & Recommendations:  Problem # 1:  CHEST PAIN (ICD-786.50) Symptoms are substantially improved and quite tolerable from the patient's standpoint.  No further assessment or treatment is required.  I will be happy to reassess [name] should she develop additional problems with which I can assist but will not plan a routine return office visit.  Patient Instructions: 1)  Your physician recommends that you schedule a follow-up appointment in: as needed  2)  Your physician recommends that you continue on your current medications as directed. Please refer to the Current Medication list given to you today.

## 2010-07-18 NOTE — Miscellaneous (Signed)
Summary: LABS BMP,A1C,03/15/2010  Clinical Lists Changes  Observations: Added new observation of CALCIUM: 9.3 mg/dL (11/91/4782 95:62) Added new observation of CREATININE: 0.96 mg/dL (13/01/6577 46:96) Added new observation of BUN: 9 mg/dL (29/52/8413 24:40) Added new observation of BG RANDOM: 123 mg/dL (04/12/2535 64:40) Added new observation of CO2 PLSM/SER: 30 meq/L (03/05/2010 14:44) Added new observation of CL SERUM: 100 meq/L (03/05/2010 14:44) Added new observation of K SERUM: 3.8 meq/L (03/05/2010 14:44) Added new observation of NA: 140 meq/L (03/05/2010 14:44) Added new observation of HGBA1C: 6.4 % (03/05/2010 14:44)

## 2010-07-23 ENCOUNTER — Encounter (INDEPENDENT_AMBULATORY_CARE_PROVIDER_SITE_OTHER): Payer: Medicare Other | Admitting: Psychiatry

## 2010-07-23 DIAGNOSIS — F39 Unspecified mood [affective] disorder: Secondary | ICD-10-CM

## 2010-07-29 ENCOUNTER — Encounter: Payer: Medicare Other | Attending: Physical Medicine and Rehabilitation

## 2010-07-29 ENCOUNTER — Ambulatory Visit: Payer: Medicare Other | Admitting: Physical Medicine and Rehabilitation

## 2010-07-29 DIAGNOSIS — M48061 Spinal stenosis, lumbar region without neurogenic claudication: Secondary | ICD-10-CM

## 2010-07-29 DIAGNOSIS — M79609 Pain in unspecified limb: Secondary | ICD-10-CM

## 2010-07-29 DIAGNOSIS — M5137 Other intervertebral disc degeneration, lumbosacral region: Secondary | ICD-10-CM | POA: Insufficient documentation

## 2010-07-29 DIAGNOSIS — N329 Bladder disorder, unspecified: Secondary | ICD-10-CM | POA: Insufficient documentation

## 2010-07-29 DIAGNOSIS — M545 Low back pain, unspecified: Secondary | ICD-10-CM | POA: Insufficient documentation

## 2010-07-29 DIAGNOSIS — M542 Cervicalgia: Secondary | ICD-10-CM

## 2010-07-29 DIAGNOSIS — M51379 Other intervertebral disc degeneration, lumbosacral region without mention of lumbar back pain or lower extremity pain: Secondary | ICD-10-CM | POA: Insufficient documentation

## 2010-07-29 DIAGNOSIS — R269 Unspecified abnormalities of gait and mobility: Secondary | ICD-10-CM | POA: Insufficient documentation

## 2010-07-29 DIAGNOSIS — M5380 Other specified dorsopathies, site unspecified: Secondary | ICD-10-CM | POA: Insufficient documentation

## 2010-07-31 ENCOUNTER — Encounter (INDEPENDENT_AMBULATORY_CARE_PROVIDER_SITE_OTHER): Payer: Medicare Other | Admitting: Psychiatry

## 2010-07-31 DIAGNOSIS — F39 Unspecified mood [affective] disorder: Secondary | ICD-10-CM

## 2010-08-05 NOTE — Op Note (Signed)
[name], [name]                ACCOUNT NO.:  [id number]  MEDICAL RECORD NO.:  [id number]          PATIENT TYPE:  AMB  LOCATION:  [facility]SC                          FACILITY:  [facility]  PHYSICIAN:  Cindee Salt, M.D.       DATE OF BIRTH:  03/15/1981  DATE OF PROCEDURE:  07/09/2010 DATE OF DISCHARGE:                              OPERATIVE REPORT   PREOPERATIVE DIAGNOSIS:  Carpal tunnel syndrome, right hand.  POSTOPERATIVE DIAGNOSIS:  Carpal tunnel syndrome, right hand.  OPERATION:  Decompression of right median nerve.  SURGEON:  Cindee Salt, MD.  ASSISTANTCarolyne Fiscal, RN.  ANESTHESIA:  MAC with local infiltration.  ANESTHESIOLOGIST:  Maren Beach, MD.  HISTORY:  The patient is a 30 year old female with a history of carpal tunnel syndrome, EMG nerve conduction is positive.  It has not responded to conservative treatment.  She is admitted now for surgical decompression of the median nerve.  Pre, peri, and postoperative course have been discussed along with risks and complications.  She is aware that there is no guarantee with the surgery; possibility of infection; recurrence; injury to arteries, nerves, tendons; incomplete relief of symptoms; dystrophy.  In the preoperative area, the patient is seen, extremity marked by both the patient and surgeon.  Antibiotic given.  PROCEDURE IN DETAIL:  The patient was brought to the operating room where a local anesthetic along with MAC was given due to the inability to find a vein on her right side for IV regional.  She was prepped using ChloraPrep, supine position, right arm free.  A 3-minute dry time was allowed.  Time-out taken confirming the patient and the procedure.  The limb was exsanguinated with an Esmarch bandage.  Tourniquet placed on forearm, inflated to 250 mmHg.  Local infiltration was given with 0.25% Marcaine without epinephrine.  Xylocaine 1%, both without epinephrine, and a  longitudinal incision was made in the palm, carried  down through the subcutaneous tissue.  Bleeders were electrocauterized.  Palmar fascia was split, superficial palmar arch identified.  Flexor tendon of the ring and little finger identified to the ulnar side of the median nerve.  Carpal retinaculum was incised with sharp dissection.  A right- angle and Sewall retractor were placed between skin and forearm fascia. The fascia released for approximately a centimeter and half proximal to the wrist crease under direct vision.  Canal was explored.  Air compression to the nerve was immediately apparent.  Tenosynovial tissue was moderately thickened.  No further lesions were identified.  The wound was irrigated.  Skin was then closed with interrupted 5-0 Vicryl Rapide sutures.  Sterile compressive dressing and splint to the wrist with fingers was applied.  On deflation of the tourniquet, all fingers immediately pinked.  She was taken to the recovery room for observation in satisfactory condition.  She will be discharged home to return to the [facility] in 1 week on Vicodin.          ______________________________ Cindee Salt, M.D.     GK/MEDQ  D:  07/09/2010  T:  07/10/2010  Job:  [id number]  cc:   Channing Mutters  Marvene Staff, MD  Electronically Signed by Cindee Salt M.D. on 08/05/2010 02:24:00 PM

## 2010-08-08 ENCOUNTER — Encounter (INDEPENDENT_AMBULATORY_CARE_PROVIDER_SITE_OTHER): Payer: Medicare Other | Admitting: Psychiatry

## 2010-08-08 DIAGNOSIS — F39 Unspecified mood [affective] disorder: Secondary | ICD-10-CM

## 2010-08-15 ENCOUNTER — Encounter (INDEPENDENT_AMBULATORY_CARE_PROVIDER_SITE_OTHER): Payer: Medicare Other | Admitting: Psychiatry

## 2010-08-15 DIAGNOSIS — F39 Unspecified mood [affective] disorder: Secondary | ICD-10-CM

## 2010-08-19 ENCOUNTER — Encounter (INDEPENDENT_AMBULATORY_CARE_PROVIDER_SITE_OTHER): Payer: Medicare Other | Admitting: Psychiatry

## 2010-08-19 DIAGNOSIS — F39 Unspecified mood [affective] disorder: Secondary | ICD-10-CM

## 2010-08-26 ENCOUNTER — Ambulatory Visit: Payer: Medicare Other | Admitting: Physical Medicine and Rehabilitation

## 2010-08-26 ENCOUNTER — Encounter: Payer: Medicare Other | Attending: Physical Medicine and Rehabilitation

## 2010-08-26 DIAGNOSIS — M5380 Other specified dorsopathies, site unspecified: Secondary | ICD-10-CM | POA: Insufficient documentation

## 2010-08-26 DIAGNOSIS — M5137 Other intervertebral disc degeneration, lumbosacral region: Secondary | ICD-10-CM | POA: Insufficient documentation

## 2010-08-26 DIAGNOSIS — M542 Cervicalgia: Secondary | ICD-10-CM

## 2010-08-26 DIAGNOSIS — M545 Low back pain, unspecified: Secondary | ICD-10-CM

## 2010-08-26 DIAGNOSIS — R269 Unspecified abnormalities of gait and mobility: Secondary | ICD-10-CM | POA: Insufficient documentation

## 2010-08-26 DIAGNOSIS — G894 Chronic pain syndrome: Secondary | ICD-10-CM

## 2010-08-26 DIAGNOSIS — M51379 Other intervertebral disc degeneration, lumbosacral region without mention of lumbar back pain or lower extremity pain: Secondary | ICD-10-CM | POA: Insufficient documentation

## 2010-08-26 DIAGNOSIS — N329 Bladder disorder, unspecified: Secondary | ICD-10-CM | POA: Insufficient documentation

## 2010-08-26 DIAGNOSIS — M76899 Other specified enthesopathies of unspecified lower limb, excluding foot: Secondary | ICD-10-CM

## 2010-08-27 ENCOUNTER — Encounter (INDEPENDENT_AMBULATORY_CARE_PROVIDER_SITE_OTHER): Payer: Medicare Other | Admitting: Psychiatry

## 2010-08-27 DIAGNOSIS — F39 Unspecified mood [affective] disorder: Secondary | ICD-10-CM

## 2010-08-29 LAB — COMPREHENSIVE METABOLIC PANEL
Albumin: 3.1 g/dL — ABNORMAL LOW (ref 3.5–5.2)
Alkaline Phosphatase: 47 U/L (ref 39–117)
BUN: 8 mg/dL (ref 6–23)
Calcium: 8.6 mg/dL (ref 8.4–10.5)
Creatinine, Ser: 1 mg/dL (ref 0.4–1.2)
Glucose, Bld: 135 mg/dL — ABNORMAL HIGH (ref 70–99)
Total Protein: 6.5 g/dL (ref 6.0–8.3)

## 2010-08-29 LAB — GLUCOSE, CAPILLARY
Glucose-Capillary: 131 mg/dL — ABNORMAL HIGH (ref 70–99)
Glucose-Capillary: 135 mg/dL — ABNORMAL HIGH (ref 70–99)
Glucose-Capillary: 139 mg/dL — ABNORMAL HIGH (ref 70–99)

## 2010-08-29 LAB — CARDIAC PANEL(CRET KIN+CKTOT+MB+TROPI)
CK, MB: 1 ng/mL (ref 0.3–4.0)
Relative Index: 0.8 (ref 0.0–2.5)
Relative Index: INVALID (ref 0.0–2.5)
Total CK: 87 U/L (ref 7–177)
Troponin I: 0.02 ng/mL (ref 0.00–0.06)

## 2010-08-29 LAB — CBC
Hemoglobin: 11.9 g/dL — ABNORMAL LOW (ref 12.0–15.0)
MCV: 87.6 fL (ref 78.0–100.0)
Platelets: 278 10*3/uL (ref 150–400)
RBC: 4.05 MIL/uL (ref 3.87–5.11)
WBC: 8.1 10*3/uL (ref 4.0–10.5)

## 2010-08-29 LAB — POCT I-STAT, CHEM 8
Calcium, Ion: 1.13 mmol/L (ref 1.12–1.32)
Chloride: 100 mEq/L (ref 96–112)
Creatinine, Ser: 0.9 mg/dL (ref 0.4–1.2)
HCT: 41 % (ref 36.0–46.0)
Hemoglobin: 13.9 g/dL (ref 12.0–15.0)
Potassium: 3.2 mEq/L — ABNORMAL LOW (ref 3.5–5.1)
Sodium: 139 mEq/L (ref 135–145)

## 2010-08-29 LAB — LIPID PANEL
LDL Cholesterol: 140 mg/dL — ABNORMAL HIGH (ref 0–99)
Total CHOL/HDL Ratio: 5.3 RATIO
Triglycerides: 122 mg/dL (ref <150)
VLDL: 24 mg/dL (ref 0–40)

## 2010-08-29 LAB — POCT CARDIAC MARKERS
Myoglobin, poc: 45.3 ng/mL (ref 12–200)
Troponin i, poc: <0.05 ng/mL (ref 0.00–0.09)

## 2010-08-29 LAB — PHOSPHORUS: Phosphorus: 3.4 mg/dL (ref 2.3–4.6)

## 2010-08-29 LAB — TSH: TSH: 1.4 u[IU]/mL (ref 0.350–4.500)

## 2010-08-30 LAB — DIFFERENTIAL
Lymphocytes Relative: 29 % (ref 12–46)
Lymphs Abs: 2.7 10*3/uL (ref 0.7–4.0)
Neutrophils Relative %: 64 % (ref 43–77)

## 2010-08-30 LAB — CBC
Platelets: 359 10*3/uL (ref 150–400)
RBC: 4.3 MIL/uL (ref 3.87–5.11)
WBC: 9.4 10*3/uL (ref 4.0–10.5)

## 2010-08-30 LAB — BASIC METABOLIC PANEL
Calcium: 9.4 mg/dL (ref 8.4–10.5)
Chloride: 101 mEq/L (ref 96–112)
Creatinine, Ser: 0.92 mg/dL (ref 0.4–1.2)
GFR calc Af Amer: >60 mL/min (ref >60)
Sodium: 139 mEq/L (ref 135–145)

## 2010-08-30 LAB — POCT CARDIAC MARKERS
CKMB, poc: <1 ng/mL — ABNORMAL LOW (ref 1.0–8.0)
Myoglobin, poc: 63.6 ng/mL (ref 12–200)
Troponin i, poc: <0.05 ng/mL (ref 0.00–0.09)

## 2010-08-30 LAB — D-DIMER, QUANTITATIVE: D-Dimer, Quant: 0.28 ug/mL-FEU (ref 0.00–0.48)

## 2010-08-31 LAB — BASIC METABOLIC PANEL
BUN: 11 mg/dL (ref 6–23)
Chloride: 100 mEq/L (ref 96–112)
Glucose, Bld: 171 mg/dL — ABNORMAL HIGH (ref 70–99)
Potassium: 2.9 mEq/L — ABNORMAL LOW (ref 3.5–5.1)

## 2010-08-31 LAB — CBC
HCT: 38.4 % (ref 36.0–46.0)
MCHC: 33.6 g/dL (ref 30.0–36.0)
MCV: 87.4 fL (ref 78.0–100.0)
RDW: 15.1 % (ref 11.5–15.5)
WBC: 11.6 10*3/uL — ABNORMAL HIGH (ref 4.0–10.5)

## 2010-08-31 LAB — DIFFERENTIAL
Basophils Absolute: 0.1 10*3/uL (ref 0.0–0.1)
Eosinophils Relative: 1 % (ref 0–5)
Lymphocytes Relative: 26 % (ref 12–46)
Monocytes Absolute: 0.6 10*3/uL (ref 0.1–1.0)

## 2010-08-31 LAB — POCT CARDIAC MARKERS
CKMB, poc: <1 ng/mL — ABNORMAL LOW (ref 1.0–8.0)
CKMB, poc: <1 ng/mL — ABNORMAL LOW (ref 1.0–8.0)
Myoglobin, poc: 61.5 ng/mL (ref 12–200)
Troponin i, poc: <0.05 ng/mL (ref 0.00–0.09)

## 2010-09-03 ENCOUNTER — Ambulatory Visit (INDEPENDENT_AMBULATORY_CARE_PROVIDER_SITE_OTHER): Payer: Medicare Other | Admitting: Urology

## 2010-09-03 DIAGNOSIS — N3941 Urge incontinence: Secondary | ICD-10-CM

## 2010-09-03 DIAGNOSIS — R350 Frequency of micturition: Secondary | ICD-10-CM

## 2010-09-03 DIAGNOSIS — R3915 Urgency of urination: Secondary | ICD-10-CM

## 2010-09-03 LAB — DIFFERENTIAL
Basophils Absolute: 0.1 10*3/uL (ref 0.0–0.1)
Basophils Relative: 0 % (ref 0–1)
Lymphocytes Relative: 13 % (ref 12–46)
Monocytes Relative: 8 % (ref 3–12)
Neutro Abs: 10.4 10*3/uL — ABNORMAL HIGH (ref 1.7–7.7)
Neutrophils Relative %: 79 % — ABNORMAL HIGH (ref 43–77)

## 2010-09-03 LAB — BASIC METABOLIC PANEL
CO2: 30 mEq/L (ref 19–32)
Calcium: 8.7 mg/dL (ref 8.4–10.5)
Creatinine, Ser: 0.92 mg/dL (ref 0.4–1.2)
GFR calc Af Amer: >60 mL/min (ref >60)
GFR calc non Af Amer: >60 mL/min (ref >60)

## 2010-09-03 LAB — CBC
MCHC: 32.4 g/dL (ref 30.0–36.0)
RBC: 4.4 MIL/uL (ref 3.87–5.11)

## 2010-09-03 LAB — URINE CULTURE: Colony Count: >=100000

## 2010-09-03 LAB — URINALYSIS, ROUTINE W REFLEX MICROSCOPIC
Ketones, ur: NEGATIVE mg/dL
Nitrite: NEGATIVE
Protein, ur: 30 mg/dL — AB
Urobilinogen, UA: 0.2 mg/dL (ref 0.0–1.0)

## 2010-09-03 LAB — URINE MICROSCOPIC-ADD ON

## 2010-09-04 ENCOUNTER — Encounter (INDEPENDENT_AMBULATORY_CARE_PROVIDER_SITE_OTHER): Payer: Medicare Other | Admitting: Psychiatry

## 2010-09-04 DIAGNOSIS — F39 Unspecified mood [affective] disorder: Secondary | ICD-10-CM

## 2010-09-04 LAB — CBC
MCV: 88.5 fL (ref 78.0–100.0)
RBC: 4.83 MIL/uL (ref 3.87–5.11)
WBC: 9 10*3/uL (ref 4.0–10.5)

## 2010-09-04 LAB — COMPREHENSIVE METABOLIC PANEL
ALT: 24 U/L (ref 0–35)
AST: 26 U/L (ref 0–37)
CO2: 30 mEq/L (ref 19–32)
Chloride: 96 mEq/L (ref 96–112)
GFR calc Af Amer: >60 mL/min (ref >60)
GFR calc non Af Amer: 58 mL/min — ABNORMAL LOW (ref >60)
Sodium: 138 mEq/L (ref 135–145)
Total Bilirubin: 0.9 mg/dL (ref 0.3–1.2)

## 2010-09-04 LAB — CK TOTAL AND CKMB (NOT AT ARMC): CK, MB: 1.1 ng/mL (ref 0.3–4.0)

## 2010-09-04 LAB — D-DIMER, QUANTITATIVE: D-Dimer, Quant: 0.37 ug/mL-FEU (ref 0.00–0.48)

## 2010-09-04 LAB — POCT CARDIAC MARKERS
CKMB, poc: <1 ng/mL — ABNORMAL LOW (ref 1.0–8.0)
Myoglobin, poc: 86.6 ng/mL (ref 12–200)

## 2010-09-04 LAB — DIFFERENTIAL
Basophils Absolute: 0 10*3/uL (ref 0.0–0.1)
Eosinophils Absolute: 0.1 10*3/uL (ref 0.0–0.7)
Eosinophils Relative: 1 % (ref 0–5)

## 2010-09-04 LAB — TROPONIN I: Troponin I: 0.01 ng/mL (ref 0.00–0.06)

## 2010-09-12 ENCOUNTER — Encounter (INDEPENDENT_AMBULATORY_CARE_PROVIDER_SITE_OTHER): Payer: Medicare Other | Admitting: Psychiatry

## 2010-09-12 DIAGNOSIS — F331 Major depressive disorder, recurrent, moderate: Secondary | ICD-10-CM

## 2010-09-18 ENCOUNTER — Encounter (INDEPENDENT_AMBULATORY_CARE_PROVIDER_SITE_OTHER): Payer: Medicare Other | Admitting: Psychiatry

## 2010-09-18 DIAGNOSIS — F39 Unspecified mood [affective] disorder: Secondary | ICD-10-CM

## 2010-09-22 LAB — DIFFERENTIAL
Eosinophils Absolute: 0.1 10*3/uL (ref 0.0–0.7)
Lymphocytes Relative: 26 % (ref 12–46)
Lymphs Abs: 2.8 10*3/uL (ref 0.7–4.0)
Monocytes Relative: 10 % (ref 3–12)
Neutro Abs: 6.5 10*3/uL (ref 1.7–7.7)
Neutrophils Relative %: 62 % (ref 43–77)

## 2010-09-22 LAB — COMPREHENSIVE METABOLIC PANEL
ALT: 24 U/L (ref 0–35)
Albumin: 3.8 g/dL (ref 3.5–5.2)
BUN: 7 mg/dL (ref 6–23)
Calcium: 9.5 mg/dL (ref 8.4–10.5)
Calcium: 9.1 mg/dL (ref 8.4–10.5)
Creatinine, Ser: 1.04 mg/dL (ref 0.4–1.2)
Creatinine, Ser: 0.83 mg/dL (ref 0.4–1.2)
GFR calc Af Amer: >60 mL/min (ref >60)
Glucose, Bld: 139 mg/dL — ABNORMAL HIGH (ref 70–99)
Glucose, Bld: 103 mg/dL — ABNORMAL HIGH (ref 70–99)
Sodium: 139 mEq/L (ref 135–145)
Total Protein: 7.6 g/dL (ref 6.0–8.3)
Total Protein: 7 g/dL (ref 6.0–8.3)

## 2010-09-22 LAB — BLOOD GAS, ARTERIAL
O2 Content: 2 L/min
pCO2 arterial: 37.8 mmHg (ref 35.0–45.0)
pH, Arterial: 7.453 — ABNORMAL HIGH (ref 7.350–7.400)

## 2010-09-22 LAB — CARDIAC PANEL(CRET KIN+CKTOT+MB+TROPI)
Relative Index: INVALID (ref 0.0–2.5)
Relative Index: INVALID (ref 0.0–2.5)
Troponin I: <0.01 ng/mL (ref 0.00–0.06)

## 2010-09-22 LAB — CBC
HCT: 42 % (ref 36.0–46.0)
Hemoglobin: 14.5 g/dL (ref 12.0–15.0)
Hemoglobin: 14.2 g/dL (ref 12.0–15.0)
MCHC: 34.1 g/dL (ref 30.0–36.0)
MCHC: 33.9 g/dL (ref 30.0–36.0)
Platelets: 275 10*3/uL (ref 150–400)
RDW: 14.1 % (ref 11.5–15.5)
RDW: 14.3 % (ref 11.5–15.5)

## 2010-09-22 LAB — LIPID PANEL
Cholesterol: 260 mg/dL — ABNORMAL HIGH (ref 0–200)
HDL: 36 mg/dL — ABNORMAL LOW (ref >39)
LDL Cholesterol: 193 mg/dL — ABNORMAL HIGH (ref 0–99)
Total CHOL/HDL Ratio: 7.2 RATIO
Triglycerides: 153 mg/dL — ABNORMAL HIGH (ref <150)

## 2010-09-22 LAB — POCT CARDIAC MARKERS
CKMB, poc: <1 ng/mL — ABNORMAL LOW (ref 1.0–8.0)
Myoglobin, poc: 65.8 ng/mL (ref 12–200)
Troponin i, poc: <0.05 ng/mL (ref 0.00–0.09)

## 2010-09-22 LAB — BRAIN NATRIURETIC PEPTIDE: Pro B Natriuretic peptide (BNP): <30 pg/mL (ref 0.0–100.0)

## 2010-09-22 LAB — HCG, SERUM, QUALITATIVE: Preg, Serum: NEGATIVE

## 2010-09-24 ENCOUNTER — Encounter: Payer: Medicare Other | Attending: Neurosurgery | Admitting: Neurosurgery

## 2010-09-24 DIAGNOSIS — M76899 Other specified enthesopathies of unspecified lower limb, excluding foot: Secondary | ICD-10-CM | POA: Insufficient documentation

## 2010-09-24 DIAGNOSIS — M5137 Other intervertebral disc degeneration, lumbosacral region: Secondary | ICD-10-CM | POA: Insufficient documentation

## 2010-09-24 DIAGNOSIS — M542 Cervicalgia: Secondary | ICD-10-CM | POA: Insufficient documentation

## 2010-09-24 DIAGNOSIS — M1610 Unilateral primary osteoarthritis, unspecified hip: Secondary | ICD-10-CM

## 2010-09-24 DIAGNOSIS — M51379 Other intervertebral disc degeneration, lumbosacral region without mention of lumbar back pain or lower extremity pain: Secondary | ICD-10-CM | POA: Insufficient documentation

## 2010-09-24 DIAGNOSIS — R269 Unspecified abnormalities of gait and mobility: Secondary | ICD-10-CM | POA: Insufficient documentation

## 2010-09-24 DIAGNOSIS — M545 Low back pain, unspecified: Secondary | ICD-10-CM | POA: Insufficient documentation

## 2010-09-24 DIAGNOSIS — M79609 Pain in unspecified limb: Secondary | ICD-10-CM | POA: Insufficient documentation

## 2010-09-24 DIAGNOSIS — M25559 Pain in unspecified hip: Secondary | ICD-10-CM | POA: Insufficient documentation

## 2010-09-24 DIAGNOSIS — M5430 Sciatica, unspecified side: Secondary | ICD-10-CM

## 2010-09-25 LAB — POCT PREGNANCY, URINE: Preg Test, Ur: NEGATIVE

## 2010-10-01 ENCOUNTER — Emergency Department (HOSPITAL_COMMUNITY)
Admission: EM | Admit: 2010-10-01 | Discharge: 2010-10-01 | Disposition: A | Discharge status: Home / Self Care | Payer: Medicare Other | Attending: Emergency Medicine | Admitting: Emergency Medicine

## 2010-10-01 DIAGNOSIS — IMO0001 Reserved for inherently not codable concepts without codable children: Secondary | ICD-10-CM | POA: Insufficient documentation

## 2010-10-01 DIAGNOSIS — E876 Hypokalemia: Secondary | ICD-10-CM | POA: Insufficient documentation

## 2010-10-01 DIAGNOSIS — R071 Chest pain on breathing: Secondary | ICD-10-CM | POA: Insufficient documentation

## 2010-10-01 DIAGNOSIS — E119 Type 2 diabetes mellitus without complications: Secondary | ICD-10-CM | POA: Insufficient documentation

## 2010-10-01 DIAGNOSIS — J45909 Unspecified asthma, uncomplicated: Secondary | ICD-10-CM | POA: Insufficient documentation

## 2010-10-01 DIAGNOSIS — I10 Essential (primary) hypertension: Secondary | ICD-10-CM | POA: Insufficient documentation

## 2010-10-01 LAB — POCT CARDIAC MARKERS
CKMB, poc: <1 ng/mL — ABNORMAL LOW (ref 1.0–8.0)
Myoglobin, poc: 46.3 ng/mL (ref 12–200)
Troponin i, poc: <0.05 ng/mL (ref 0.00–0.09)

## 2010-10-01 LAB — CBC
MCH: 27.9 pg (ref 26.0–34.0)
MCV: 86.4 fL (ref 78.0–100.0)
Platelets: 329 10*3/uL (ref 150–400)
RDW: 15.3 % (ref 11.5–15.5)
WBC: 7.5 10*3/uL (ref 4.0–10.5)

## 2010-10-01 LAB — BASIC METABOLIC PANEL
BUN: 6 mg/dL (ref 6–23)
Calcium: 9.3 mg/dL (ref 8.4–10.5)
Creatinine, Ser: 1.12 mg/dL (ref 0.4–1.2)
GFR calc non Af Amer: 56 mL/min — ABNORMAL LOW (ref >60)
Glucose, Bld: 119 mg/dL — ABNORMAL HIGH (ref 70–99)
Potassium: 2.8 mEq/L — ABNORMAL LOW (ref 3.5–5.1)

## 2010-10-01 LAB — DIFFERENTIAL
Basophils Relative: 0 % (ref 0–1)
Eosinophils Absolute: 0.1 10*3/uL (ref 0.0–0.7)
Eosinophils Relative: 2 % (ref 0–5)
Lymphs Abs: 2.3 10*3/uL (ref 0.7–4.0)

## 2010-10-01 IMAGING — US IR FLUORO GUIDE CV LINE*R*
1 series · 1 of 1 positions shown · non-contrast
Comparison: none

CLINICAL DATA: Poor venous access.  Request has been made for
preoperative PICC line placement

UPPER EXTREMITY PICC PLACEMENT WITH ULTRASOUND AND FLUORO GUIDANCE
TECHNIQUE: The  right arm was prepped with chlorhexidine, draped in
the usual sterile fashion using maximum barrier technique and
infiltrated locally with 1% Lidocaine.  Ultrasound demonstrated
patency of the right cephalic vein.  Under real-time ultrasound
guidance, this vein was accessed with a 21 gauge micropuncture
needle.  Ultrasound image documentation was performed.  The needle
was exchanged over a guidewire for a peel-away sheath through which
a five French dual lumen PICC trimmed to 41cm was advanced,
positioned with its tip at the distal SVC/right atrial junction.
Fluoroscopy during the procedure and fluoro spot radiograph
confirms appropriate catheter position.  The catheter was flushed,
secured to the skin with Prolene sutures, and covered with a
sterile dressing.  No immediate complication.

[Series 1: sp fluoro guide cv line*right* · 1 of 1 slices shown]
[im 1/1]
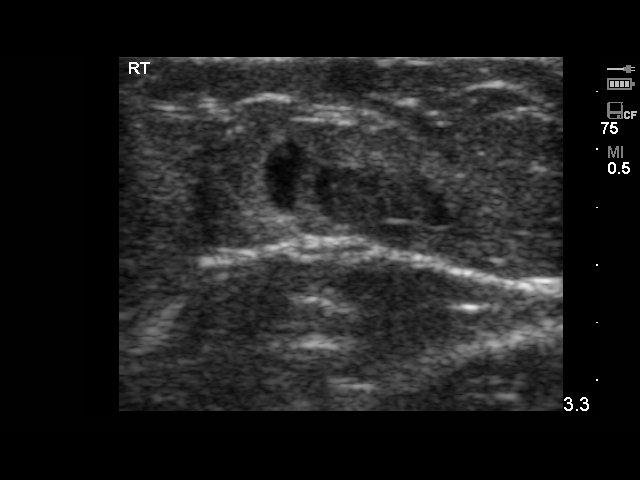

[1 of 1 positions shown; findings below may reference images not displayed]

IMPRESSION: Technically successful right arm PICC placement with ultrasound and
fluoroscopic guidance.  The catheter is ready for use.

Read by: Marroquin, Bernd.-SAVIO

## 2010-10-02 ENCOUNTER — Encounter (HOSPITAL_COMMUNITY): Payer: Medicare Other | Admitting: Psychiatry

## 2010-10-02 ENCOUNTER — Emergency Department (HOSPITAL_COMMUNITY): Payer: Medicare Other

## 2010-10-02 ENCOUNTER — Emergency Department (HOSPITAL_COMMUNITY)
Admission: EM | Admit: 2010-10-02 | Discharge: 2010-10-03 | Disposition: A | Discharge status: Home / Self Care | Payer: Medicare Other | Attending: Emergency Medicine | Admitting: Emergency Medicine

## 2010-10-02 DIAGNOSIS — Z79899 Other long term (current) drug therapy: Secondary | ICD-10-CM | POA: Insufficient documentation

## 2010-10-02 DIAGNOSIS — J45909 Unspecified asthma, uncomplicated: Secondary | ICD-10-CM | POA: Insufficient documentation

## 2010-10-02 DIAGNOSIS — E119 Type 2 diabetes mellitus without complications: Secondary | ICD-10-CM | POA: Insufficient documentation

## 2010-10-02 DIAGNOSIS — Z8542 Personal history of malignant neoplasm of other parts of uterus: Secondary | ICD-10-CM | POA: Insufficient documentation

## 2010-10-02 DIAGNOSIS — E7800 Pure hypercholesterolemia, unspecified: Secondary | ICD-10-CM | POA: Insufficient documentation

## 2010-10-02 DIAGNOSIS — IMO0001 Reserved for inherently not codable concepts without codable children: Secondary | ICD-10-CM | POA: Insufficient documentation

## 2010-10-02 DIAGNOSIS — R0602 Shortness of breath: Secondary | ICD-10-CM | POA: Insufficient documentation

## 2010-10-02 DIAGNOSIS — I10 Essential (primary) hypertension: Secondary | ICD-10-CM | POA: Insufficient documentation

## 2010-10-02 DIAGNOSIS — G4730 Sleep apnea, unspecified: Secondary | ICD-10-CM | POA: Insufficient documentation

## 2010-10-02 DIAGNOSIS — M1990 Unspecified osteoarthritis, unspecified site: Secondary | ICD-10-CM | POA: Insufficient documentation

## 2010-10-04 ENCOUNTER — Ambulatory Visit (HOSPITAL_COMMUNITY): Payer: Medicare Other

## 2010-10-05 ENCOUNTER — Inpatient Hospital Stay (HOSPITAL_COMMUNITY)
Admission: EM | Admit: 2010-10-05 | Discharge: 2010-10-08 | DRG: 203 | Disposition: A | Discharge status: Home / Self Care | Payer: Medicare Other | Attending: Internal Medicine | Admitting: Internal Medicine

## 2010-10-05 ENCOUNTER — Emergency Department (HOSPITAL_COMMUNITY): Payer: Medicare Other

## 2010-10-05 DIAGNOSIS — F329 Major depressive disorder, single episode, unspecified: Secondary | ICD-10-CM | POA: Diagnosis present

## 2010-10-05 DIAGNOSIS — I10 Essential (primary) hypertension: Secondary | ICD-10-CM | POA: Diagnosis present

## 2010-10-05 DIAGNOSIS — E876 Hypokalemia: Secondary | ICD-10-CM | POA: Diagnosis present

## 2010-10-05 DIAGNOSIS — R0789 Other chest pain: Secondary | ICD-10-CM | POA: Diagnosis present

## 2010-10-05 DIAGNOSIS — IMO0001 Reserved for inherently not codable concepts without codable children: Secondary | ICD-10-CM | POA: Diagnosis present

## 2010-10-05 DIAGNOSIS — E785 Hyperlipidemia, unspecified: Secondary | ICD-10-CM | POA: Diagnosis present

## 2010-10-05 DIAGNOSIS — F3289 Other specified depressive episodes: Secondary | ICD-10-CM | POA: Diagnosis present

## 2010-10-05 DIAGNOSIS — J45901 Unspecified asthma with (acute) exacerbation: Principal | ICD-10-CM | POA: Diagnosis present

## 2010-10-05 DIAGNOSIS — E119 Type 2 diabetes mellitus without complications: Secondary | ICD-10-CM | POA: Diagnosis present

## 2010-10-05 LAB — D-DIMER, QUANTITATIVE: D-Dimer, Quant: 0.33 ug/mL-FEU (ref 0.00–0.48)

## 2010-10-05 LAB — CBC
MCHC: 32.9 g/dL (ref 30.0–36.0)
RDW: 15.1 % (ref 11.5–15.5)

## 2010-10-05 LAB — BASIC METABOLIC PANEL
BUN: 10 mg/dL (ref 6–23)
CO2: 29 mEq/L (ref 19–32)
Chloride: 99 mEq/L (ref 96–112)
Creatinine, Ser: 1.01 mg/dL (ref 0.4–1.2)
Glucose, Bld: 115 mg/dL — ABNORMAL HIGH (ref 70–99)

## 2010-10-05 LAB — DIFFERENTIAL
Basophils Absolute: 0.1 10*3/uL (ref 0.0–0.1)
Basophils Relative: 1 % (ref 0–1)
Eosinophils Relative: 1 % (ref 0–5)
Monocytes Absolute: 0.6 10*3/uL (ref 0.1–1.0)
Neutro Abs: 5.4 10*3/uL (ref 1.7–7.7)

## 2010-10-05 LAB — POCT CARDIAC MARKERS

## 2010-10-05 LAB — GLUCOSE, CAPILLARY: Glucose-Capillary: 96 mg/dL (ref 70–99)

## 2010-10-06 LAB — CARDIAC PANEL(CRET KIN+CKTOT+MB+TROPI)
Relative Index: 1.4 (ref 0.0–2.5)
Total CK: 111 U/L (ref 7–177)
Troponin I: 0.01 ng/mL (ref 0.00–0.06)

## 2010-10-07 LAB — BASIC METABOLIC PANEL
CO2: 33 mEq/L — ABNORMAL HIGH (ref 19–32)
Calcium: 8.8 mg/dL (ref 8.4–10.5)
Creatinine, Ser: 1.03 mg/dL (ref 0.4–1.2)
GFR calc Af Amer: >60 mL/min (ref >60)

## 2010-10-08 ENCOUNTER — Inpatient Hospital Stay (HOSPITAL_COMMUNITY)
Admission: RE | Admit: 2010-10-08 | Discharge: 2010-10-08 | Disposition: A | Discharge status: Home / Self Care | Payer: Medicare Other | Source: Ambulatory Visit | Attending: Internal Medicine | Admitting: Internal Medicine

## 2010-10-12 NOTE — Group Therapy Note (Signed)
  NAMEMARJA, [name]                ACCOUNT NO.:  [id number]  MEDICAL RECORD NO.:  [id number]           PATIENT TYPE:  I  LOCATION:  A304                          FACILITY:  [facility]  PHYSICIAN:  Kingsley Callander. Ouida Sills, MD       DATE OF BIRTH:  21-Nov-1980  DATE OF PROCEDURE:  10/07/2010 DATE OF DISCHARGE:                                PROGRESS NOTE   She is feeling a little better this morning.  She has had some wheezing overnight.  She just completed a nebulizer treatment which has helped her, she states.  PHYSICAL EXAMINATION:  VITAL SIGNS:  Her temperature is 97.9, pulse 84, respirations 19, blood pressure 103/72, and oxygen saturation 100% on 2 liters by nasal cannula. LUNGS:  Expiratory wheezes. HEART:  Regular with no murmurs. ABDOMEN:  Obese and nontender. EXTREMITIES:  No edema.  IMPRESSION/PLAN: 1. Asthma.  Continue inhaled bronchodilators, prednisone, and     doxycycline.  Oxygen will be discontinued.  She was encouraged to     ambulate in the home to see I she will respond with minimal     activity. 2. Hypokalemia.  Serum potassium is normalized to 4.3.  Chlorthalidone     has been modified to lisinopril. 3. Diabetes.  Glucose this morning is 126. 4. Noncardiac chest pain.  Telemetry will be discontinued. 5. Nausea.  Zofran p.r.n.     Kingsley Callander. Ouida Sills, MD     ROF/MEDQ  D:  10/07/2010  T:  10/07/2010  Job:  [id number]  Electronically Signed by Carylon Perches MD on 10/12/2010 11:09:37 AM

## 2010-10-12 NOTE — Discharge Summary (Signed)
[name], [name]                ACCOUNT NO.:  [id number]  MEDICAL RECORD NO.:  [id number]          PATIENT TYPE:  LOCATION:                                 FACILITY:  PHYSICIAN:  Kingsley Callander. Ouida Sills, MD            DATE OF BIRTH:  DATE OF ADMISSION:  10/05/2010 DATE OF DISCHARGE:  04/24/2012LH                              DISCHARGE SUMMARY   DISCHARGE DIAGNOSES: 1. Asthma exacerbation. 2. Hypokalemia. 3. Type 2 diabetes. 4. Noncardiac chest pain. 5. Fibromyalgia. 6. Chronic back pain. 7. Hypertension. 8. Depression. 9. Hyperlipidemia.  DISCHARGE MEDICATIONS: 1. Doxycycline 100 mg b.i.d. 2. Prednisone 10 mg 2 daily for 3 days, 1 daily for 3 days and 1.5     daily for 3 days. 3. Albuterol nebulizer 2.5 mg every 6 hours. 4. ProAir 2 puffs q.4  h. p.r.n. 5. Lipitor 80 mg daily. 6. Metformin 500 mg b.i.d. 7. Fluoxetine 20 mg 2 q.a.m. 1 q.p.m. 8. Abilify 20 mg daily. 9. Detrol LA 4 mg daily. 10.Gabapentin 600 mg q.6 h. 11.Lidoderm patch 2 daily for 12 hours home. 12.Methocarbamol 750 mg every 4 hours as needed. 13.Premarin 0.625 mg daily. 14.Metoprolol 50 mg b.i.d. 15.Nitroglycerin patch 0.4 mg daily x12 hours, p.r.n. 16.Sublingual nitroglycerin 0.4 mg every 5 minutes x3 p.r.n.  HOSPITAL COURSE:  This patient is a 30 year old female who presented with shortness of breath, wheezing and cough.  Her chest x-ray revealed no acute infiltrate.  The patient had been in the emergency room 2 times in the prior week with similar symptoms.  Her white count was 8.4. Hemoglobin was 13.2.  She had experienced noncardiac chest pain.  She had negative cardiac markers.  Her EKG revealed no evidence of acute ischemia.  She was treated with inhaled bronchodilators, supplemental oxygen, doxycycline and prednisone.  Her wheezing gradually improved.  She was weaned off of oxygen and is maintained oxygen saturations in the mid 90s on room air.  She has been able to ambulate in the room.  She  underwent pulmonary function studies, which revealed a mild obstructive defect. She was significantly improved and stable for discharge on the afternoon of October 08, 2010.  She will be seen in follow up in my office in 1 week.  She was mildly hypokalemic on admission at 3.2.  She was supplemented to normal range.  Her potassium was 4.3 on October 07, 2010.  Chlorthalidone has been stopped.  Her antihypertensive therapy has been modified to lisinopril 10 mg daily with metoprolol 50 mg b.i.d., potassium supplements have been stopped.  Her diabetes has been treated with metformin.  She has been stable from a Psychiatric standpoint on fluoxetine and Abilify  Her chronic pain has been treated with gabapentin, Lidoderm and methocarbamol.  Arrangements have been made for home nebulizer therapy which she will use every 6 hours.  She had a metered-dose inhaler to use on a p.r.n. basis as well.  Her prednisone will be gradually tapered.  She will complete her course of doxycycline orally.     Kingsley Callander. Ouida Sills, MD     ROF/MEDQ  D:  10/08/2010  T:  10/09/2010  Job:  [id number]  Electronically Signed by Carylon Perches MD on 10/12/2010 11:09:33 AM

## 2010-10-12 NOTE — Group Therapy Note (Signed)
  NAMEJYSSICA, RIEF                ACCOUNT NO.:  [id number]  MEDICAL RECORD NO.:  [id number]          PATIENT TYPE:  LOCATION:                                 FACILITY:  PHYSICIAN:  Kingsley Callander. Ouida Sills, MD       DATE OF BIRTH:  [birth year]  DATE OF PROCEDURE:  10/08/2010 DATE OF DISCHARGE:                                PROGRESS NOTE   She notes less wheezing this morning.  She was able to walk yesterday evening.  Her oxygen saturations have remained in the 90s.  Her oxygen saturation now is 96% on room air.  PHYSICAL EXAMINATION:  VITAL SIGNS:  Temperature is 97.6, pulse 84, respirations 16, blood pressure 117/82. GENERAL:  She is breathing comfortably and does not appear dyspneic. LUNGS:  Slight expiratory wheezes. HEART:  Regular with no murmurs. EXTREMITIES:  No edema.  IMPRESSION AND PLAN: 1. Asthma.  Continue nebulizers, prednisone and doxycycline.     Pulmonary function studies will be obtained today. 2. Diabetes.  Accu-Chek from this morning is pending.     Kingsley Callander. Ouida Sills, MD     ROF/MEDQ  D:  10/08/2010  T:  10/08/2010  Job:  [id number]  Electronically Signed by Carylon Perches MD on 10/12/2010 11:09:39 AM

## 2010-10-12 NOTE — Group Therapy Note (Signed)
  [name], [name]                ACCOUNT NO.:  [id number]  MEDICAL RECORD NO.:  [id number]           PATIENT TYPE:  I  LOCATION:  A304                          FACILITY:  [facility]  PHYSICIAN:  Kingsley Callander. Ouida Sills, MD       DATE OF BIRTH:  26-Apr-1981  DATE OF PROCEDURE:  10/06/2010 DATE OF DISCHARGE:                                PROGRESS NOTE   [name] was admitted yesterday with shortness of breath and wheezing. She had been seen in the emergency room twice last week with similar symptoms.  She had been treated with prednisone, doxycycline, and albuterol.  Her chest x-ray on October 02, 2010, was clear.  Her chest x- ray on admission was clear.  She denied any purulent sputum production. She had no known history of asthma.  She had pulmonary function studies revealing normal spirometry in 2003.  Her D-dimer was normal on admission at 0.33.  She had a normal white count of 8.4.  She has been continued on nebulizer treatments, supplemental oxygen, prednisone, and doxycycline.  PHYSICAL EXAMINATION:  GENERAL:  She is feeling better this morning. VITAL SIGNS:  Her temperature is 97.5, pulse 70, respirations 18, blood pressure 125/77, oxygen saturation is 100% on 2 L by nasal cannula. LUNGS:  Minimal bilateral expiratory wheezes. HEART:  Regular with no murmurs or gallops. ABDOMEN:  Obese. EXTREMITIES:  No edema.  IMPRESSION AND PLAN: 1. Shortness of breath and wheezing, possibly due to asthma.  She had     already been scheduled for pulmonary function studies again for     this coming Tuesday.  For today, she will be continued on nebulizer     treatments along with supplemental oxygen, prednisone, and     doxycycline.  She is improving. 2. Chest pain.  She has a longstanding history of musculoskeletal     pain.  She has had cardiac enzymes which are negative.  Her EKG     reveals normal sinus rhythm with minimal nonspecific T-wave     changes. 3. Hypertension and hypokalemia.  She has  been treated with     chlorthalidone.  Her potassium on admission was 3.2.  She will be     modified at this time from chlorthalidone to lisinopril in light of     her underlying history of obesity and type 2 diabetes.  Hypokalemia     is being treated with oral supplementation. 4. Diabetes.  Her glucose was 96 yesterday afternoon and is pending     for this morning. 5. Fibromyalgia.  Continue p.r.n. ibuprofen.  She is also on Lidoderm     patches for chronic back pain followed at     the pain clinic.  She is also on Neurontin. 6. Depression.  Continue fluoxetine and Abilify.  She is followed by     Psychiatry. 7. Hyperlipidemia.  Continue Lipitor.     Kingsley Callander. Ouida Sills, MD     ROF/MEDQ  D:  10/06/2010  T:  10/06/2010  Job:  [id number]  Electronically Signed by Carylon Perches MD on 10/12/2010 11:09:35 AM

## 2010-10-21 ENCOUNTER — Encounter
Payer: Medicare Other | Attending: Physical Medicine and Rehabilitation | Admitting: Physical Medicine and Rehabilitation

## 2010-10-21 ENCOUNTER — Encounter (INDEPENDENT_AMBULATORY_CARE_PROVIDER_SITE_OTHER): Payer: Medicare Other | Admitting: Psychiatry

## 2010-10-21 DIAGNOSIS — M48061 Spinal stenosis, lumbar region without neurogenic claudication: Secondary | ICD-10-CM | POA: Insufficient documentation

## 2010-10-21 DIAGNOSIS — M545 Low back pain, unspecified: Secondary | ICD-10-CM

## 2010-10-21 DIAGNOSIS — M79609 Pain in unspecified limb: Secondary | ICD-10-CM | POA: Insufficient documentation

## 2010-10-21 DIAGNOSIS — M76899 Other specified enthesopathies of unspecified lower limb, excluding foot: Secondary | ICD-10-CM | POA: Insufficient documentation

## 2010-10-21 DIAGNOSIS — R269 Unspecified abnormalities of gait and mobility: Secondary | ICD-10-CM | POA: Insufficient documentation

## 2010-10-21 DIAGNOSIS — G894 Chronic pain syndrome: Secondary | ICD-10-CM

## 2010-10-21 DIAGNOSIS — F39 Unspecified mood [affective] disorder: Secondary | ICD-10-CM

## 2010-10-21 DIAGNOSIS — M542 Cervicalgia: Secondary | ICD-10-CM | POA: Insufficient documentation

## 2010-10-22 NOTE — Assessment & Plan Note (Signed)
[name] is a pleasant 30 year old African American morbidly obese woman.  She was last seen by nurse practitioner Lauree Chandler on September 24, 2010, and I saw her August 26, 2010.  She has chronic low back pain with bilateral lower extremity pain and has known central canal stenosis at L4-5 measuring approximately 8 mm.  She is back in today requesting refill of her gabapentin.  Her average pain is between 5 and 6.  She reports fair relief with current meds.  Her pain is worsened with prolonged walking or standing, improves with rest, medication, and injections.  She did have trochanteric bursitis injection which seemed to help quite a bit.  Her functional status is as follows.  She can walk 5 minutes at a time.  She is able to climb stairs and drive.  She is independent with feeding, dressing, and bathing, needs assistance with toileting.  No new problems with respect to review of systems other than weight gain.  Past medical, social, and family history are otherwise unchanged.  Medications prescribed through [facility] include gabapentin 600, 1 p.o. q.6 h., and p.r.n. Lidoderm patches.  On exam today, her blood pressure is 135/83, pulse 90, respirations 18, 95% saturation on room air.  She is an morbidly obese woman who does not appear in any distress.  She is oriented x3.  Speech is clear.  Affect is bright.  She is alert, cooperative, and pleasant.  Follows commands without difficulty, answers my questions appropriately.  Cranial nerves and coordination are intact.  Reflexes are diminished at the patellar tendons, 2+ at the Achilles tendon.  She reports diminished sensation in the left L5-S1 dermatome.  Motor strength, however, is overall good on manual muscle testing while she is sitting, but she does appear to have some dorsiflexion weakness during ambulation after some time.  She has limitations in lumbar motion in all planes.  Internal and external rotation at  the hips does not aggravate her pain.  Minimal limitations and cervical range of motion, full shoulder range of motion noted.  IMPRESSION: 1. L4-5 significant spinal stenosis, 8 mm at this level, also stenosis     at L5-S1. 2. Gait disorder. 3. Trochanteric bursitis. 4. Morbid obesity. 5. Bladder problems followed by Urology. 6. Cervicalgia.  PLAN: 1. Recommend pool therapy.  She is not completely open to this     currently.  She will think about it.  We would like to get her set     up for electrodiagnostic studies to evaluate the left lower     extremity. 2. We will consider epidural steroid injection. 3. We will refill gabapentin 600 mg 1 p.o. q.6 h. #120.  She does not     need a refill on her Lidoderm at this time.  I have answered all     her questions.  She is comfortable with our treatment plan     currently.     Brantley Stage, M.D.    DMK/MedQ D:  10/21/2010 11:41:23  T:  10/22/2010 00:00:59  Job #:  [id number]

## 2010-10-23 ENCOUNTER — Ambulatory Visit: Payer: Medicare Other | Admitting: Physical Medicine and Rehabilitation

## 2010-10-29 NOTE — Consult Note (Signed)
NAMEJOVANNI, ECKHART                ACCOUNT NO.:  [id number]   MEDICAL RECORD NO.:  [id number]          PATIENT TYPE:  INP   LOCATION:  A316                          FACILITY:  [facility]   PHYSICIAN:  Gerrit Friends. Dietrich Pates, MD, FACCDATE OF BIRTH:  March 05, 1981   DATE OF CONSULTATION:  DATE OF DISCHARGE:  01/01/2009                                 CONSULTATION   REFERRING PHYSICIAN:  Kingsley Callander. Ouida Sills, MD   HISTORY OF PRESENT ILLNESS:  A 30 year old woman with no prior history  of cardiovascular disease except hypertension presenting with chest  discomfort.  Ms. Fajardo has enjoyed generally good health.  She has a  history of hypertension and has been well treated medically.  She has  had EKG abnormalities in the past, but prior EKGs are not available for  review.  She reports intermittent chest discomfort that is generally  mild and associated with prolonged standing.  She underwent a D and C  approximately 9 days ago at which time a PICC line was placed.  She had  some discomfort along the course of that line, but subsequently did  better.  Over the past few days, she has noticed cough, diaphoresis, and  chest discomfort.  She did not take her temperature.  She did not have  rigors.  There has been a pleuritic component to her pain, which is  moderate.  She also describes a sensation of chest fullness or pressure  on her chest associated with difficulty taking a deep breath, although  not true dyspnea.  She has had no nausea nor emesis.  In the hospital,  the symptoms are gradually resolved.  She feels some dyspnea on  exertion, but is otherwise about back to baseline.  Her cough is  decreased in intensity.  She has had some sputum production, but cannot  quantify or specify to appearance.   PAST MEDICAL HISTORY:  Otherwise notable for obesity and arthroscopic  surgery of her knee.   CURRENT MEDICATIONS:  1. Chlorthalidone 25 mg daily.  2. Metoprolol 50 mg b.i.d.  3. Diclofenac 75 mg b.i.d.  p.r.n.  4. Darvocet p.r.n.   She reports an allergy to CODEINE.   SOCIAL HISTORY:  Unmarried; her mother lives locally and provides her  with assistance.  No use of tobacco products or alcohol.   FAMILY HISTORY:  No prominent coronary artery disease or other cardiac  issues.   REVIEW OF SYSTEMS:  She has been told of hyperlipidemia in the past.  She has had some asthma, but this has not been cause for  hospitalization.  She has chronic class II-III dyspnea on exertion.  She  reports some arthritic discomfort.  She suffers from intermittent  headaches.  She has a history of carpal tunnel syndrome.  She has some  diffuse myalgias.  She has had GERD, but follows a regular diet.  She is  premenopausal, but has had some excessive bleeding leading to her recent  gynecologic procedure.  She has been told of anemia in the past,  depression, and anxiety with a history of suicide attempt in the past.  She has insomnia, but does not use pharmacologic agents.   PHYSICAL EXAMINATION:  GENERAL:  Pleasant overweight woman in no acute  distress.  The weight is not recorded.  VITAL SIGNS:  Temperature 98, heart rate 72 and regular, respirations  20, blood pressure 120/65, and O2 saturation 96% on room air.  HEENT:  EOMs full; pupils equal, round, and react to light; normal lids  and conjunctivae; normal oral mucosa.  NECK:  No jugular venous distention; excess adipose tissue; mild diffuse  thyroid enlargement; no bruits.  LUNGS:  Clear.  CARDIAC:  Normal first and second heart sounds; minimal systolic  ejection murmur.  ABDOMEN:  Soft and nontender; no organomegaly.  EXTREMITIES:  No edema; normal distal pulses.  NEUROLOGIC:  Symmetric strength and tone; normal cranial nerves.  SKIN:  No significant lesions.   INITIAL LABORATORY:  Notable for normal hemoglobin A1c value, normal  cardiac markers, an adverse lipid profile with total cholesterol of 260,  triglycerides of 153, HDL of 36 and LDL of  193.  A normal chemistry  profile and a normal CBC.  D-dimer was 0.29.  Blood gas was normal.   EKG:  Normal sinus rhythm; prominent voltage; left atrial abnormality;  ST-T wave abnormalities inferiorly representing ischemia or LVH.  No  prior tracing for comparison, but a report of 2003 showed LVH.   CHEST X-RAY:  Reviewed.  No significant abnormalities - the right  hemidiaphragm is somewhat elevated.   IMPRESSION:  Ms. Mcnelly presents with atypical chest discomfort in the  setting of URI symptoms.  This proper bleed represents a chest wall  injury related to coughing.  Her history of recent PICC line is of  interest with possibility of perforation of great vessels or heart;  however, there is no apparent fluid or soft tissue collection on plain  films of the chest, no cardiomegaly, and no signs of tamponade.  We will  proceed with an echocardiogram.  If no abnormalities found, she can be  discharged for continued followup and possible additional testing should  she develop recurrent discomfort.  The likelihood of a coronary anomaly  producing ischemia is low; however, she should get serial EKGs to verify  that the changes seen on her EKG are chronic.  I appreciate the request  for consultation and will be happy to follow this nice woman with you.      Gerrit Friends. Dietrich Pates, MD, [facility]  Electronically Signed     RMR/MEDQ  D:  01/01/2009  T:  01/02/2009  Job:  [id number]

## 2010-10-29 NOTE — H&P (Signed)
[name], [name]                ACCOUNT NO.:  [id number]   MEDICAL RECORD NO.:  [id number]          PATIENT TYPE:  AMB   LOCATION:                                FACILITY:  [facility]   PHYSICIAN:  Duke Salvia. Marcelle Overlie, M.D.DATE OF BIRTH:  June 08, 1981   DATE OF ADMISSION:  12/22/2008  DATE OF DISCHARGE:                              HISTORY & PHYSICAL   CHIEF COMPLAINT:  Endometrial polyps, abnormal bleeding.   HISTORY OF PRESENT ILLNESS:  A 30 year old, G0, P0, not sexually active  was evaluated in the ED in April 2010 with complaints of menorrhagia and  was referred to our office.  Initial evaluation, hemoglobin 14.1.  SHG  was scheduled that showed several well-developed polyps and thickened  endometrium.  Adnexa showed findings consistent with PCOS.  She presents  now for D and C, hysteroscopy.  This procedure including risks related  to bleeding, infection, adjacent organ injury, all reviewed with her.  All of her questions were answered.   ALLERGIES:  CODEINE.   CURRENT MEDICATIONS:  1. Diclofenac 75 mg twice daily.  2. Chlorthalidone 25 mg 1 daily.  3. Metoprolol 50 mg daily.   REVIEW OF SYSTEMS:  Significant for hypertension, anemia, and arthritis.   FAMILY HISTORY:  Significant for heart disease, asthma, anemia,  diabetes, arthritis, gallbladder disease, and hypertension.   OTHER SURGERY:  Prior arthroscopy.   SOCIAL HISTORY:  Denies smoking, alcohol, or drug use.  Her medical  doctor is Dr. Carylon Perches.  She is single.   PHYSICAL EXAMINATION:  VITAL SIGNS:  Temp 98.2, blood pressure 130/92.  HEENT:  Unremarkable.  NECK:  Supple without masses.  LUNGS:  Clear.  CARDIOVASCULAR:  Regular rate and rhythm without murmurs, rubs, or  gallops noted.  BREASTS:  Without masses.  ABDOMEN:  Soft, flat, nontender.  PELVIC:  Normal external genitalia.  Vagina and cervix clear.  Uterus  mid positional size.  Adnexa negative.  EXTREMITIES:  Unremarkable.  NEUROLOGIC:   Unremarkable.   IMPRESSION:  1. Hypertension.  2. Obesity, weight is 321.  3. Abnormal bleeding with menorrhagia and endometrial polyps.   PLAN:  D and C, hysteroscopy.  Procedure and risks reviewed as above.      Richard M. Marcelle Overlie, M.D.  Electronically Signed     RMH/MEDQ  D:  12/14/2008  T:  12/15/2008  Job:  [id number]

## 2010-10-29 NOTE — H&P (Signed)
NAMETYLAN, BRIGUGLIO                ACCOUNT NO.:  [id number]   MEDICAL RECORD NO.:  [id number]          PATIENT TYPE:  INP   LOCATION:  A316                          FACILITY:  [facility]   PHYSICIAN:  Kingsley Callander. Ouida Sills, MD       DATE OF BIRTH:  1980-06-18   DATE OF ADMISSION:  12/30/2008  DATE OF DISCHARGE:  LH                              HISTORY & PHYSICAL   CHIEF COMPLAINT:  Chest pain.   HISTORY OF PRESENT ILLNESS:  This patient is a 30 year old African  American female, who presented to the emergency room with substernal  chest pain that she described as a heaviness.  There was no radiation.  The pain began 9 days prior to admission.  She has had intermittent  worsening of her pain to the 8-9/10 range, but at the time of my exam  was 4/10.  She had had sweating on one occasion at night.  She had felt  shortness of breath, lying down made the pain worse.  She denies really  doing any type of exertional activity, so I cannot really comment on how  this may have affected her symptoms.  She has not experienced syncope or  vomiting, although she has felt some nausea.  She was evaluated in the  emergency room and found to have inferior ST-T wave changes.  Review of  her chart from the office revealed that these changes are new.  She is  on metoprolol and chlorthalidone for hypertension.  She has a history of  shortness of breath.  She had had normal pulmonary function studies in  2003.  She has smoked in the past, but denies tobacco use now.  She has  had an impaired fasting glucose.   PAST MEDICAL HISTORY:  1. Obesity.  2. Hypertension.  3. Recent D and C and hysteroscopy by Dr. Marcelle Overlie for abnormal      bleeding with endometrial polyps on December 22, 2008.  4. Knee arthroscopy.   MEDICATIONS:  1. Chlorthalidone 25 mg daily.  2. Metoprolol 50 mg b.i.d.  3. Diclofenac 75 mg b.i.d. p.r.n.  4. Darvocet p.r.n.   ALLERGIES:  CODEINE.   SOCIAL HISTORY:  She does not smoke, drink, or use  recreational drugs.   REVIEW OF SYSTEMS:  It sounds though she has a very little exercise  tolerance.   PHYSICAL EXAMINATION:  VITAL SIGNS:  Heart rate initially 123, blood  pressure 139/82, respirations 26, temperature 98.7.  general:  At the time of my exam, she was not tachypneic or dyspneic  appearing.  HEENT:  Eyes and oropharynx are unremarkable.  NECK:  No appreciable JVD or thyromegaly.  LUNGS:  Clear.  HEART:  Regular with no murmurs.  ABDOMEN:  Nontender.  No hepatosplenomegaly.  EXTREMITIES:  No calf swelling.  No edema.  NEUROLOGIC:  No focal weakness.  LYMPH NODES:  No palpable cervical or supraclavicular enlargement.   LABORATORY DATA:  The initial EKG reveals sinus tachycardia at 123 beats  per minute with inferior ST-T wave changes.  The chest x-ray reveals no  infiltrate or edema.  Hemoglobin  14.5, white count 10.6, and platelet  count 312,000.  D-dimer 0.29, troponin-I less than 0.05.  ABG on 2 L  revealed a pH of 7.45, pCO2 37, and pO2 of 134.  Sodium 139, potassium  3.5, bicarb 31, glucose 139, BUN 8, creatinine 1.04, SGOT 26, albumin  3.8, and calcium 9.5.  BNP less than 30.   IMPRESSION:  1. Chest pain with new EKG changes.  These changes are likely related      to LVH associated with her hypertension, down her symptoms are      related to ischemia.  She will be hospitalized and evaluated with      serial cardiac enzymes and echocardiogram and Cardiology      consultation will be obtained.  Her initial tachycardia has      resolved.  Her shortness of breath has improved.  She is      oxygenating well.  Her D-dimer is normal, so pulmonary embolus is      of low likelihood.  2. Hypertension.  Continue metoprolol and chlorthalidone.  3. Recent dilation and curettage.  4. Morbid obesity.  5. History of impaired fasting glucose.  We will check an A1c and a      lipid profile.      Kingsley Callander. Ouida Sills, MD  Electronically Signed     ROF/MEDQ  D:  12/31/2008   T:  01/01/2009  Job:  [id number]

## 2010-10-29 NOTE — Op Note (Signed)
[name], [name]                ACCOUNT NO.:  [id number]   MEDICAL RECORD NO.:  [id number]          PATIENT TYPE:  AMB   LOCATION:  SDC                           FACILITY:  [facility]   PHYSICIAN:  Duke Salvia. Marcelle Overlie, M.D.DATE OF BIRTH:  10/16/1980   DATE OF PROCEDURE:  12/22/2008  DATE OF DISCHARGE:                               OPERATIVE REPORT   PREOPERATIVE DIAGNOSES:  Abnormal bleeding with endometrial polyps.   POSTOPERATIVE DIAGNOSES:  Abnormal bleeding with endometrial polyps.   PROCEDURE:  Dilation and curettage, hysteroscopy.   SURGEON:  Duke Salvia. Marcelle Overlie, MD   ANESTHESIA:  Paracervical block plus general.   SPECIMENS REMOVED:  Endometrial curettings.   COMPLICATIONS:  None.   BLOOD LOSS:  Minimal.   PROCEDURE AND FINDINGS:  The patient was taken to the operating room.  After an adequate level of general anesthesia was obtained with legs in  stirrups, the perineum and vagina were prepped and draped.  The bladder  was drained, EUA carried out, uterus mid position, normal size, adnexa  negative.  Weighted speculum was positioned.  Paracervical block created  by infiltrating at 3 and 9 o'clock submucosally, 5-7 mL of 1% Xylocaine  on either side after negative aspiration.  The uterus was then sounded  to a 7-8 cm, progressively dilated to a 27 Pratt dilator.  The 4-mm  scope was then inserted and large amount of tissue including some  polypoid tissue was noted.  A sharp curettage and exploration with the  polyp forceps revealed a moderate amount of tissue, which was sent to  Pathology.  The scope was reinserted.  The cavity was irrigated and no  remaining polyps were noted.  There was still some significant  endometrial buildup.  Repeat sharp curettage was administered and more  tissue together was accomplished.  She tolerated this well, went to  recovery room in good condition.      Richard M. Marcelle Overlie, M.D.  Electronically Signed     RMH/MEDQ  D:  12/22/2008   T:  12/22/2008  Job:  [id number]

## 2010-10-29 NOTE — Consult Note (Signed)
[name], [name]                ACCOUNT NO.:  [id number]   MEDICAL RECORD NO.:  [id number]          PATIENT TYPE:  OUT   LOCATION:  GYN                          FACILITY:  [name]   PHYSICIAN:  De Blanch, M.D.DATE OF BIRTH:  1980-08-29   DATE OF CONSULTATION:  01/19/2009  DATE OF DISCHARGE:  01/19/2009                                 CONSULTATION   CHIEF COMPLAINT:  Endometrial hyperplasia with atypia.   HISTORY OF PRESENT ILLNESS:  A 30 year old [place] American female seen  in consultation at the request of Dr. Richarda [place] regarding  management of a newly diagnosed endometrial hyperplasia with atypia.  The patient gives a longstanding history of abnormal uterine bleeding  which sounds like amenorrhea.  She reports she can go for months without  having a period and then will have a heavy period.  She recently  presented with abnormal bleeding to the emergency room.  Dr. [facility]  assumed her care and performed a sonohystogram in the office in May 2010  which showed a thickened endometrium with a polypoid mass noted in the  endometrial cavity.  In early July, she underwent an outpatient D and C  and hysteroscopy which returned showing atypical complex hyperplasia.   Subsequent to the D and C, she was readmitted to the [facility] with chest  pain, shortness of breath.  Had EKG monitoring over 2 days and the  symptoms resolved, although she is scheduled to undergo a stress test in  the very near future.   PAST MEDICAL HISTORY/ILLNESSES:  1. Obesity.  2. Degenerative joint disease confining the patient essentially to      bed.  She does take a few steps to the bathroom and gets around in      a wheelchair or with a walker for short distances.   CURRENT MEDICATIONS:  1. Metoprolol.  2. Chlorthalidone.  3. Diclofenac.   DRUG ALLERGIES:  1. CODEINE.  2. NORFLEX   PAST SURGICAL HISTORY:  Arthroscopy in 1997.   SOCIAL HISTORY:  The patient is single.  She does not  smoke.  She does  not work.   REVIEW OF SYSTEMS:  A 10-point comprehensive review of systems negative  except as noted above.   PHYSICAL EXAMINATION:  VITAL SIGNS:  Height 5 feet 2 inches, weight 320  pounds, blood pressure 118/72.  GENERAL:  The patient is a morbidly  obese African American female who arrives by wheelchair and requires  significant assistance moving on the table and getting on and off of the  exam table.  HEENT:  Negative.  NECK:  Supple without thyromegaly.  There is no supraclavicular or  inguinal adenopathy.  ABDOMEN:  Morbidly obese, soft, nontender.  No masses, organomegaly,  ascites can be detected.  PELVIC:  EGBUS and vagina are normal.  Cervix appears normal.  Uterus  cannot be outlined due to the patient's obesity.  There are no adnexal  masses.   IMPRESSION:  Endometrial hyperplasia with atypia.  I explained to the  patient that there are three treatment options including hysterectomy,  oral progestins, or Mirena IUD.  The  patient strongly desires a  hysterectomy.  I explained to the patient and her mother that  hysterectomy in her situation would be a very morbid procedure and that  I would advise against her undergoing any surgery.  In fact, it was very  difficult for the patient to lie flat on the exam table for more than 5  minutes today.  She is a very high risk for a number of surgical  complications.  I feel strongly that she will be best treated either  with the oral progestins (Megace 40 mg t.i.d.) or a Mirena IUD.  The  patient is very disappointed that she cannot be done with her problem  by having a hysterectomy.  I explained that I would feel very  uncomfortable doing a hysterectomy and accepting any complications given  that we have options for treatment that result in less risk.  At the  conclusion of a long discussion, the patient will consider her options  and contact us once she has decided what she would like to.  I indicated  we do  not insert IUDs in this clinic and that she would either need to  return to Dr. Marcelle [facility] to have the IUD placed at the [facility].  All the patient's questions are answered as were the  questions of her mother.      De Blanch, M.D.  Electronically Signed     DC/MEDQ  D:  01/19/2009  T:  01/20/2009  Job:  [id number]   cc:   [name], M.D.  Fax: [phone]   [name], R.N.  [address]  [place], [place] [address]   [facility]elli Callander. Ouida Sills, MD  Fax: [phone]

## 2010-10-29 NOTE — Discharge Summary (Signed)
[name], GERON                ACCOUNT NO.:  [id number]   MEDICAL RECORD NO.:  [id number]         PATIENT TYPE:  PINP   LOCATION:  A316                          FACILITY:  [facility]   PHYSICIAN:  Kingsley Callander. Ouida Sills, MD       DATE OF BIRTH:  [birth year]   DATE OF ADMISSION:  DATE OF DISCHARGE:  LH                               DISCHARGE SUMMARY   DISCHARGE DIAGNOSES:  1. Noncardiac chest pain.  2. Hypertension.  3. Hyperlipidemia.   HOSPITAL COURSE:  This patient is a 30 year old female who presented to  the emergency room with chest pain described as a heaviness.  She  developed shortness of breath.  Her EKG revealed normal sinus rhythm  with ST-T wave changes inferiorly.  She has a history of obesity,  hypertension, and an impaired fasting glucose.  She was hospitalized for  monitoring and serial cardiac enzymes.  Cardiac enzymes were negative.  She underwent an echocardiogram, which revealed no wall motion  abnormalities.  She was seen in Cardiology consultation by Dr. Dietrich Pates.  Her chest pain was felt to be noncardiac.  The chest x-ray revealed no  acute cardiopulmonary disease.  Her D-dimer was normal at 0.29.   She was hyperlipidemic with a total cholesterol of 260 with an LDL of  193, HDL 36 and triglycerides of 153.  She was hyperglycemic at 139.  Her hemoglobin A1c was normal at 5.8.   She had recently experienced mild upper respiratory infection symptoms.  Her white count was 10.6.  Her ABG revealed a pH of 7.45 with a pCO2 of  37 and a pO2 of 134 on 2 L.   Her repeat EKG revealed sinus rhythm with inferior ST-T wave changes as  well.  These were felt to likely reflect left ventricular hypertrophy.   IMPRESSION:  She was improved and stable for discharge on May 19.  She  will be seen in followup in my office in a week.  Simvastatin and baby  aspirin were added.   DISCHARGE MEDICATIONS:  1. Simvastatin 40 mg nightly.  2. Aspirin 81 mg daily.  3. Metoprolol 50 mg b.i.d.  4. Chlorthalidone 25 mg daily.  5. Darvocet q.4 h. p.r.n.  6. ProAir inhaler 2 puffs q.4 p.r.n.  7. She was advised to stop diclofenac.      Kingsley Callander. Ouida Sills, MD  Electronically Signed     ROF/MEDQ  D:  01/12/2009  T:  01/12/2009  Job:  [id number]

## 2010-10-29 NOTE — Procedures (Signed)
[name], [name]                ACCOUNT NO.:  [id number]   MEDICAL RECORD NO.:  [id number]          PATIENT TYPE:  OUT   LOCATION:  SLEE                          FACILITY:  [facility]   PHYSICIAN:  Kofi A. Gerilyn Pilgrim, M.D. DATE OF BIRTH:  Mar 20, 1981   DATE OF PROCEDURE:  02/10/2009  DATE OF DISCHARGE:  02/10/2009                             SLEEP DISORDER REPORT   REFERRING PHYSICIAN:  Kingsley Callander. Ouida Sills, MD   INDICATIONS:  This is a 30 year old who presents with hypersomnia and  snoring.  Study is being done to evaluate for obstructive sleep apnea  syndrome.   EPWORTH SLEEPINESS SCALE:  14.   MEDICATIONS:  Alprazolam, tramadol, simvastatin, metoprolol, aspirin,  chlorthalidone.   BMI 60   ARCHITECTURAL SUMMARY:  This is a split night recording.  The first half  is a diagnostic and the second portion a titration recording.  The total  recording time is 441 minutes.  The sleep efficiency is 76.7%.  Sleep  latency 30 minutes.  REM latency 180 minutes.   RESPIRATORY SUMMARY:  The baseline oxygen saturation is 98 with a lowest  saturation of 85.  The diagnostic AHI is 15.  The patient is titrated to  between pressures of 6 and 8 with 8 resulted in elimination of events.   LIMB MOVEMENT SUMMARY:  PLM index is 0.   ELECTROCARDIOGRAM SUMMARY:  Average heart rate 88 with no significant  dysrhythmias.   IMPRESSION:  Mild obstructive sleep apnea syndrome which responds well  to CPAP of 8.   Thanks for this referral.      Kofi A. Gerilyn Pilgrim, M.D.  Electronically Signed     KAD/MEDQ  D:  02/16/2009  T:  02/17/2009  Job:  [id number]

## 2010-10-29 NOTE — H&P (Signed)
[name], [name]                ACCOUNT NO.:  [id number]   MEDICAL RECORD NO.:  [id number]           PATIENT TYPE:   LOCATION:                                 FACILITY:   PHYSICIAN:  Duke Salvia. Marcelle Overlie, M.D.DATE OF BIRTH:  1980-12-22   DATE OF ADMISSION:  12/22/2008  DATE OF DISCHARGE:                              HISTORY & PHYSICAL   CHIEF COMPLAINT:  Abnormal uterine bleeding.   HISTORY OF PRESENT ILLNESS:  A 30 year old, G0, P0 patient, who is  abstinent, was seen originally in April of 2010 and with complaints of  heavy bleeding and passing clots.  She was transferred to me through an  ED referral.  Initial exam showed that she had hypertension.  SHG was  performed in our office that showed several well-developed polyps and a  thickened endometrium.  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  She presents now for D and C hysteroscopy.  This procedure  including the risks related to bleeding, infection, and other  complications that may require additional surgery were all reviewed with  her, which she understands and accepts.   PAST MEDICAL HISTORY:   ALLERGIES:  CODEINE.   CURRENT MEDICATIONS:  1. Diclofenac 75 mg 2 times a day.  2. Chlorthalidone 25 mg once daily.  3. Metoprolol 50 mg daily.   REVIEW OF SYSTEMS:  Significant for hypertension, obesity, and  arthritis.   FAMILY HISTORY:  Significant for migraine, heart disease, asthma,  gallbladder disease, arthritis, diabetes, and hypertension.   PAST SURGICAL HISTORY:  Arthroscopy in 1997.   SOCIAL HISTORY:  Denies social drugs, alcohol, or tobacco use.  She is  single.   PHYSICAL EXAMINATION:  VITAL SIGNS:  Temperature 98.2, blood pressure  140/92.  HEENT:  Unremarkable.  NECK:  Supple without masses.  LUNGS:  Clear.  CARDIOVASCULAR:  Regular rate and rhythm without murmurs, rubs, gallops  noted.  BREASTS:  Without masses.  ABDOMEN:  Soft, flat, and nontender.  PELVIC:  Normal external  genitalia,vaginaand cervix,clear, the uterus  mid position, normal size, adnexa negative.  Exam difficult due to her  weight of 328.   PLAN:  D and C hysteroscopy.  Procedure and risks reviewed as above      Duke Salvia. Marcelle Overlie, M.D.  Electronically Signed     RMH/MEDQ  D:  12/21/2008  T:  12/21/2008  Job:  [id number]

## 2010-11-01 NOTE — Procedures (Signed)
[facility]  Patient:    [name], [name] Visit Number: [id number] MRN: [id number]          Service Type: Attending:  Kari Baars, M.D. Dictated by:   Kari Baars, M.D. Proc. Date: 01/04/02 Disc. Date: 01/04/02                            EKG Interpretations  RESULTS:  The rhythm is sinus rhythm with multiple PVCs.  There is left ventricular hypertrophy.  Abnormal electrocardiogram. Dictated by:   Kari Baars, M.D. Attending:  Kari Baars, M.D. DD:  01/04/02 TD:  01/07/02 Job: [id number]

## 2010-11-01 NOTE — Procedures (Signed)
[facility]  Patient:    [name], [name] Visit Number: [id number] MRN: [id number]          Service Type: OUT Location: RAD Attending Physician:  Carylon Perches Dictated by:   Kari Baars, M.D. Admit Date:  09/16/2001                      Pulmonary Function Test Inter.  This is a normal spirometry. Dictated by:   Kari Baars, M.D. Attending Physician:  Carylon Perches DD:  09/16/01 TD:  09/18/01 Job: [id number]

## 2010-11-04 ENCOUNTER — Encounter (HOSPITAL_COMMUNITY): Payer: Medicare Other | Admitting: Psychiatry

## 2010-11-07 ENCOUNTER — Encounter (HOSPITAL_COMMUNITY): Payer: Medicare Other | Admitting: Psychiatry

## 2010-11-08 ENCOUNTER — Encounter (INDEPENDENT_AMBULATORY_CARE_PROVIDER_SITE_OTHER): Payer: Medicare Other | Admitting: Psychiatry

## 2010-11-08 DIAGNOSIS — F39 Unspecified mood [affective] disorder: Secondary | ICD-10-CM

## 2010-11-13 NOTE — Assessment & Plan Note (Signed)
Mr. [name] is a pleasant 30 year old single African American woman who is followed in our [facility] for chronic pain in her low back.  She recently is also complaining of some right hip pain which is worse when she is up walking and moving around. Her average pain is between 5 and 7 on a scale of 10.  Pain in low back described as constant.  Pain in the right hip is worse with ambulation.  She gets fair relief with current meds.  She has been using gabapentin 300 every 4 hours.  She is also using p.r.n. Lidoderm patches.  She also follows up with the psychiatrist for depression and anxiety.  Functional status.  She can walk about 5 minutes at a time.  She is able to climb stairs and drive.  She is independent with feeding, needs assistance with dressing, bathing, toileting.  Reports some problems with bladder control.  She has been referred to urology for further evaluation per primary care.  Past medical, social, family history otherwise unchanged from previous visit.  On exam; her blood pressure is 122/63, pulse 73, respirations 18, 97% saturated on room air.  She is a morbidly obese woman who does not appear in any distress.  She is oriented x3.  Speech is clear.  Affect is bright.  She is alert, cooperative and pleasant.  Follows commands without difficulty, answers my questions appropriately.  Cranial nerves and coordination are intact.  Her reflexes are intact in the lower extremities.  She has +1 reflexes in the upper extremities, diminished in the lower extremities.  Her motor strength is overall good without focal deficits.  Toes are downgoing.  Her gait, she is able to transition from sitting to standing.  Gait is slow, rather wide based, short stride length.  Balance is mildly compromised.  Romberg test is performed adequately however.  She has limitations in right shoulder motion with decreased strength with shoulder abduction, but  does complain of pain as well.  She has limitations in lumbar motion as well.  She has decreased sensation in the right L5 and S1 dermatomes.  Range of motion, her back is limited as well.  She has significant tenderness especially over the right hip with palpation today.  IMPRESSION: 1. New right trochanteric bursitis. 2. Lumbago. 3. Lumbar degenerative disk/stenosis at L4-5, L5-S1. 4. Mild gait abnormality. 5. Morbid obesity. 6. Recent bladder problems, currently being followed by Urology. 7. Cervicalgia.  Radiographs were ordered last month, however, she has     not gotten these done yet.  PLAN:  We will write another order to pursue cervical radiographs.  We will refill her gabapentin increasing dose slightly 600 mg one p.o. q.6 h. #120.  Also we discussed treatment options for her right hip including physical therapy just trialing antiinflammatory medication, hip injection.  She would like to pursue hip injection today.  I have reviewed the risks and benefits of this injection with her.  She wished to proceed 1 mL of Depo-Medrol and 4 mL of 1% lidocaine were injected into the area of the right trochanter.  The area was prepped prior to injection.  She tolerated the procedure well and postinjection instructions were given.  I have answered all her questions.  She is comfortable with our current management plan.  We will see her back in a month and she states she will get cervical radiographs we ordered at last month.     Brantley Stage, M.D. Electronically  Signed    DMK/MedQ D:  08/26/2010 14:09:27  T:  08/26/2010 22:41:57  Job #:  [id number]

## 2010-11-14 ENCOUNTER — Encounter (INDEPENDENT_AMBULATORY_CARE_PROVIDER_SITE_OTHER): Payer: Medicare Other | Admitting: Psychiatry

## 2010-11-14 DIAGNOSIS — F331 Major depressive disorder, recurrent, moderate: Secondary | ICD-10-CM

## 2010-11-27 ENCOUNTER — Ambulatory Visit: Payer: Medicare Other | Admitting: Physical Medicine and Rehabilitation

## 2010-11-27 ENCOUNTER — Encounter
Payer: Medicare Other | Attending: Physical Medicine and Rehabilitation | Admitting: Physical Medicine and Rehabilitation

## 2010-11-27 DIAGNOSIS — R209 Unspecified disturbances of skin sensation: Secondary | ICD-10-CM | POA: Insufficient documentation

## 2010-11-27 DIAGNOSIS — G541 Lumbosacral plexus disorders: Secondary | ICD-10-CM | POA: Insufficient documentation

## 2010-11-27 DIAGNOSIS — M79609 Pain in unspecified limb: Secondary | ICD-10-CM | POA: Insufficient documentation

## 2010-11-29 ENCOUNTER — Encounter (INDEPENDENT_AMBULATORY_CARE_PROVIDER_SITE_OTHER): Payer: Medicare Other | Admitting: Psychiatry

## 2010-11-29 DIAGNOSIS — F319 Bipolar disorder, unspecified: Secondary | ICD-10-CM

## 2010-12-19 ENCOUNTER — Encounter (INDEPENDENT_AMBULATORY_CARE_PROVIDER_SITE_OTHER): Payer: Medicare Other | Admitting: Psychiatry

## 2010-12-19 DIAGNOSIS — F39 Unspecified mood [affective] disorder: Secondary | ICD-10-CM

## 2010-12-20 ENCOUNTER — Encounter (HOSPITAL_COMMUNITY): Payer: Medicare Other | Admitting: Psychiatry

## 2010-12-20 ENCOUNTER — Ambulatory Visit (HOSPITAL_COMMUNITY)
Admission: RE | Admit: 2010-12-20 | Discharge: 2010-12-20 | Disposition: A | Discharge status: Home / Self Care | Payer: Medicare Other | Source: Ambulatory Visit | Attending: Internal Medicine | Admitting: Internal Medicine

## 2010-12-20 DIAGNOSIS — R0989 Other specified symptoms and signs involving the circulatory and respiratory systems: Secondary | ICD-10-CM | POA: Insufficient documentation

## 2010-12-20 DIAGNOSIS — R0609 Other forms of dyspnea: Secondary | ICD-10-CM | POA: Insufficient documentation

## 2010-12-20 LAB — BLOOD GAS, ARTERIAL
Acid-Base Excess: 3.2 mmol/L — ABNORMAL HIGH (ref 0.0–2.0)
Bicarbonate: 27.4 mEq/L — ABNORMAL HIGH (ref 20.0–24.0)
Patient temperature: 37
TCO2: 24.7 mmol/L (ref 0–100)
pH, Arterial: 7.417 — ABNORMAL HIGH (ref 7.350–7.400)

## 2010-12-23 ENCOUNTER — Encounter
Payer: Medicare Other | Attending: Physical Medicine and Rehabilitation | Admitting: Physical Medicine and Rehabilitation

## 2010-12-23 DIAGNOSIS — M5430 Sciatica, unspecified side: Secondary | ICD-10-CM | POA: Insufficient documentation

## 2010-12-23 DIAGNOSIS — G8928 Other chronic postprocedural pain: Secondary | ICD-10-CM

## 2010-12-23 DIAGNOSIS — M25569 Pain in unspecified knee: Secondary | ICD-10-CM | POA: Insufficient documentation

## 2010-12-23 DIAGNOSIS — M48061 Spinal stenosis, lumbar region without neurogenic claudication: Secondary | ICD-10-CM | POA: Insufficient documentation

## 2010-12-23 DIAGNOSIS — M76899 Other specified enthesopathies of unspecified lower limb, excluding foot: Secondary | ICD-10-CM

## 2010-12-23 DIAGNOSIS — M47817 Spondylosis without myelopathy or radiculopathy, lumbosacral region: Secondary | ICD-10-CM | POA: Insufficient documentation

## 2010-12-24 NOTE — Assessment & Plan Note (Signed)
[name] is a pleasant 30 year old African American morbidly obese woman.  She is seen last by me  for electrodiagnostic studies on November 27, 2010.  She is back in today for refill of her medications.  I reviewed the results of the test with her.  She has some right S1 nerve root irritation, otherwise normal nerve conduction study of the right lower extremity.  Her average pain continues to be about a 7 on a scale of 10, predominately low back pain.  She also has some right knee pain and some radiating pain from the back to the foot.  Pain is typically worse with standing or walking, improves with rest. She reports a little to a fair relief with the medications that she is currently taking.  She is requesting stronger medications today.  Medications prescribed through [facility] include Lidoderm one patch to the low back, one patch to the right hip, as well as 600 mg of gabapentin every 6 hours.  Functional status, she can walk 5 minutes at a time.  She is able to climb stairs.  She does not drive.  She is independent with feeding, needs assistance with dressing, bathing and toileting.  Denies problems controlling bowel or bladder.  Admits to depression and anxiety.  Denies suicidal ideation.  She reports some intermittent numbness in the right lower extremity.  Review of systems otherwise negative.  Past medical, social, family history otherwise unchanged.  PHYSICAL EXAMINATION:  Blood pressure is 144/73, pulse 81, respirations 18, 96% saturated on room air.  She is morbidly obese woman who does not appear in any distress.  She is oriented x3.  Speech is clear.  Affect is bright.  She is alert, cooperative, pleasant, smiling.  Cranial nerves are grossly intact.  Coordination is intact.  Her reflexes are diminished in the upper as well as lower extremities 1+ at the Achilles tendons bilaterally.  No abnormal tone, clonus, or tremors are noted.  Motor strength is quite good  in both lower extremities without obvious focal deficit.  Transitions from sitting to standing slowly.  Gait reveals rather wide base short stride length.  Limitations in lumbar motion in all planes are noted.  She has significant tenderness over the right trochanter.  Some knee tenderness noted as well along the medial joint line.  Pain with flexion/extension of the lumbar spine with limited motion here as well.  IMPRESSION: 1. Right trochanteric bursitis. 2. Right knee pain. 3. Lumbar spondylosis/spinal stenosis. 4. Right sciatic symptoms of mild-to-moderate intermittently.  PLAN:  We will refill her Lidoderm 5% patch one patch to lumbar spine, one patch to the left hip, 24 hours on, 24 hours off, 21-month supply with two refills.  We will trial her on Vicodin 5/325 one p.o. b.i.d. p.r.n. back pain #30 and will obtain an x-ray of her right knee AP lateral and sunrise view.  We discussed the importance of staying active.  I have recommended pool therapy as well as physical therapy.  She is not interested in pursuing a formal physical therapy program at this time.  We have also discussed other treatment options including epidural steroid injections as well as medial branch blocks for her facet arthropathy.  She is not interested in invasive means at this point.  She would like to try further medical management.  She still "thinking about"trialing an arthritis program.  I have answered all her questions.  Her mother was present with the patient's permission during the interview and exam.  I  have answered all their questions, they are comfortable with our plan.  I will see her back in 6 weeks.     [name], M.D. Electronically Signed    DMK/MedQ D:  12/23/2010 14:27:37  T:  12/24/2010 02:51:08  Job #:  [id number]

## 2011-01-06 ENCOUNTER — Encounter (INDEPENDENT_AMBULATORY_CARE_PROVIDER_SITE_OTHER): Payer: Medicare Other | Admitting: Psychiatry

## 2011-01-06 DIAGNOSIS — F39 Unspecified mood [affective] disorder: Secondary | ICD-10-CM

## 2011-01-09 ENCOUNTER — Encounter (INDEPENDENT_AMBULATORY_CARE_PROVIDER_SITE_OTHER): Payer: Medicare Other | Admitting: Psychiatry

## 2011-01-09 DIAGNOSIS — F39 Unspecified mood [affective] disorder: Secondary | ICD-10-CM

## 2011-01-27 ENCOUNTER — Encounter (INDEPENDENT_AMBULATORY_CARE_PROVIDER_SITE_OTHER): Payer: Medicare Other | Admitting: Psychiatry

## 2011-01-27 DIAGNOSIS — F39 Unspecified mood [affective] disorder: Secondary | ICD-10-CM

## 2011-02-04 ENCOUNTER — Encounter (INDEPENDENT_AMBULATORY_CARE_PROVIDER_SITE_OTHER): Payer: Medicare Other | Admitting: Psychiatry

## 2011-02-04 DIAGNOSIS — F331 Major depressive disorder, recurrent, moderate: Secondary | ICD-10-CM

## 2011-02-05 ENCOUNTER — Encounter: Payer: Medicare Other | Attending: Physical Medicine and Rehabilitation | Admitting: Neurosurgery

## 2011-02-05 DIAGNOSIS — R209 Unspecified disturbances of skin sensation: Secondary | ICD-10-CM | POA: Insufficient documentation

## 2011-02-05 DIAGNOSIS — M545 Low back pain: Secondary | ICD-10-CM

## 2011-02-05 DIAGNOSIS — G541 Lumbosacral plexus disorders: Secondary | ICD-10-CM | POA: Insufficient documentation

## 2011-02-05 DIAGNOSIS — M79609 Pain in unspecified limb: Secondary | ICD-10-CM | POA: Insufficient documentation

## 2011-02-05 DIAGNOSIS — M76899 Other specified enthesopathies of unspecified lower limb, excluding foot: Secondary | ICD-10-CM

## 2011-02-05 NOTE — Assessment & Plan Note (Signed)
This is a patient of Dr. Leretha Dykes who is seen for right hip greater trochanter bursitis as well as low back pain.  She reports no change in her low back pain, but her right hip is bothering her more and she states that she was told she would have an injection today.  She rates her pain as 6, it is a burning constant pain with aching sensation, the pain is same 24 hours a day.  Sleep patterns are fair.  Walking, bending, sitting tend to aggravate; heat and medication, injections help.  She walks with a cane.  She uses a walker from time to time.  She does drive and climb steps.  She can only walk about 5 minutes at a time.  She also has a wheelchair available and she does transfer alone. She is on disability, she needs help with all her ADLs, meal prep and shopping.  REVIEW OF SYSTEMS:  Notable for those difficulties, otherwise within normal limits.  Her Oswestry score is 82.  PAST MEDICAL HISTORY:  Unchanged.  SOCIAL HISTORY:  Unchanged.  FAMILY HISTORY:  Unchanged.  PHYSICAL EXAMINATION:  VITAL SIGNS:  Her blood pressure is 131/76, pulse 70, respirations 18, O2 sats 94 on room air. NEUROLOGIC:  It is very difficult to test her lower extremity strength due to her size.  Sensation appears to be intact.  She is point tender over the right greater trochanter area. CONSTITUTIONAL:  She is morbidly obese.  She is alert and oriented x3. Her affect is alert, but somewhat depressed.  Her gait is very unstable. She does use a single point cane.  ASSESSMENT: 1. History of right greater trochanter bursitis. 2. History of chronic low back pain. 3. Right knee pain.  PLAN:  Refill Vicodin 5/325 one p.o. b.i.d., 30 with no refill.  After informed consent, we alcohol prepped just posterior to the right greater trochanter with a 3-inch needle, injected 4 mL of lidocaine with 1 mL/40 mg Depo-Medrol.  She tolerated it well.  She knows to ice the area every night.  Dr. Pamelia Hoit will see  her back in the clinic in approximately 1 month.     Keniel Ralston L. Blima Dessert Electronically Signed    RLW/MedQ D:  02/05/2011 09:22:53  T:  02/05/2011 09:55:41  Job #:  [id number]

## 2011-02-18 ENCOUNTER — Encounter (INDEPENDENT_AMBULATORY_CARE_PROVIDER_SITE_OTHER): Payer: Medicare Other | Admitting: Psychiatry

## 2011-02-18 DIAGNOSIS — F39 Unspecified mood [affective] disorder: Secondary | ICD-10-CM

## 2011-03-05 ENCOUNTER — Encounter
Payer: Medicare Other | Attending: Physical Medicine and Rehabilitation | Admitting: Physical Medicine and Rehabilitation

## 2011-03-05 DIAGNOSIS — M47817 Spondylosis without myelopathy or radiculopathy, lumbosacral region: Secondary | ICD-10-CM | POA: Insufficient documentation

## 2011-03-05 DIAGNOSIS — M79609 Pain in unspecified limb: Secondary | ICD-10-CM

## 2011-03-05 DIAGNOSIS — M48061 Spinal stenosis, lumbar region without neurogenic claudication: Secondary | ICD-10-CM | POA: Insufficient documentation

## 2011-03-05 DIAGNOSIS — M545 Low back pain, unspecified: Secondary | ICD-10-CM

## 2011-03-05 DIAGNOSIS — M25569 Pain in unspecified knee: Secondary | ICD-10-CM | POA: Insufficient documentation

## 2011-03-05 DIAGNOSIS — M76899 Other specified enthesopathies of unspecified lower limb, excluding foot: Secondary | ICD-10-CM | POA: Insufficient documentation

## 2011-03-05 DIAGNOSIS — M5430 Sciatica, unspecified side: Secondary | ICD-10-CM | POA: Insufficient documentation

## 2011-03-05 NOTE — Assessment & Plan Note (Signed)
[name] is a very pleasant 30 year old woman who resides with her parents.  She was last seen by me on December 23, 2010.  In the interim, she is followed up with [name], nurse practitioner for refill of her medications.  [name] has multiple chronic pain complaints including recently trochanteric bursitis and knee pain.  She has known lumbar spondylosis/spinal stenosis predominately at L4-5 and L5-S1.  She also has chronic right sciatic symptoms which are worse when she stands limiting her ability to stand or walk to not more than 5 minutes.  Her average pain is about a 6 on a scale of 10.  She spends most of her day rather sedentary.  She does go out and walks around the house which takes about 5 minutes each day.  She does use a cane.  Her right knee has also bothered her off and on.  She has an elevated uric acid last year.  She has had some problems with pain in her forefeet back about a year ago, but has not had any problems since then.  No changes with respect to mobility or function since last visit.  She reports no new problems with respect to review of systems.  No changes in past medical, social, or family history.  She did undergo a right trochanteric bursitis injection by nurse practitioner, [name]. She reports approximately 50-75% relief of her lateral hip pain with this injection.  She does maintain contact with Urology as well as her primary care.  She has been diagnosed with an overactive bladder.  On exam, today, her blood pressure is 153/81, pulse 72, respirations 18, and 93% saturated on room air.  She is a morbidly obese woman who does not appear in any distress.  She is oriented x3.  Speech is clear. Affect is bright.  She is alert, cooperative, and pleasant.  Follows commands without difficulty.  Answers my questions appropriately. Cranial nerves and coordination are intact.  Reflexes are diminished in lower extremities.  No abnormal tone,  clonus, or tremors are noted.  She has rather well-preserved strength in the lower extremities, 5/5 at hip flexors, knee extensors, dorsiflexors, plantar flexors, EHL.  She has diminished sensation especially along the lateral right lower leg and into the dorsum and lateral right foot.  She has some right knee medial joint line tenderness, effusion is not appreciated.  IMPRESSION: 1. Right trochanteric bursitis, 50-75% improvement with injection last     month. 2. Chronic right knee pain, occasional left knee pain.  We will obtain     some radiographs, AP, lateral, and sunrise view to evaluate fully. 3. Lumbar spondylosis/spinal stenosis.  She has an MRI which is     attached to the chart from April 11, 2010, showing an L4-5 AP     diameter thecal sac is 8 mm and at L5-S1 she has borderline central     stenosis. 4. Elevated uric acid level 1 year ago without any further problems     with toe or foot pain.  PLAN:  She says the 600 mg of gabapentin twice a day caused a little too much sedation.  We are going to switch her back to 400 mg twice a day and reducing her hydrocodone from 5/325 b.i.d. to 5/325 daily.  She is having some improvement in her overall pain scores with the Vicodin, the left hip injection, and the gabapentin.  She is considering a consultation with neurosurgery, however, she would like to hold off  at this point and trial epidural steroid injections.  She is comfortable with this plan.     [name], M.D. Electronically Signed    DMK/MedQ D:  03/05/2011 09:58:24  T:  03/05/2011 11:33:19  Job #:  [id number]

## 2011-03-10 ENCOUNTER — Encounter (INDEPENDENT_AMBULATORY_CARE_PROVIDER_SITE_OTHER): Payer: Medicare Other | Admitting: Psychiatry

## 2011-03-10 DIAGNOSIS — F39 Unspecified mood [affective] disorder: Secondary | ICD-10-CM

## 2011-03-25 ENCOUNTER — Ambulatory Visit (HOSPITAL_COMMUNITY)
Admission: RE | Admit: 2011-03-25 | Discharge: 2011-03-25 | Disposition: A | Discharge status: Home / Self Care | Payer: Medicare Other | Source: Ambulatory Visit | Attending: Physical Medicine and Rehabilitation | Admitting: Physical Medicine and Rehabilitation

## 2011-03-25 ENCOUNTER — Other Ambulatory Visit: Payer: Self-pay | Admitting: Physical Medicine and Rehabilitation

## 2011-03-25 DIAGNOSIS — R52 Pain, unspecified: Secondary | ICD-10-CM

## 2011-03-25 DIAGNOSIS — M25569 Pain in unspecified knee: Secondary | ICD-10-CM | POA: Insufficient documentation

## 2011-03-31 ENCOUNTER — Ambulatory Visit: Payer: Medicare Other | Admitting: Physical Medicine & Rehabilitation

## 2011-03-31 ENCOUNTER — Encounter: Payer: Medicare Other | Attending: Physical Medicine and Rehabilitation

## 2011-03-31 DIAGNOSIS — M25569 Pain in unspecified knee: Secondary | ICD-10-CM | POA: Insufficient documentation

## 2011-03-31 DIAGNOSIS — M5430 Sciatica, unspecified side: Secondary | ICD-10-CM | POA: Insufficient documentation

## 2011-03-31 DIAGNOSIS — M48061 Spinal stenosis, lumbar region without neurogenic claudication: Secondary | ICD-10-CM | POA: Insufficient documentation

## 2011-03-31 DIAGNOSIS — M76899 Other specified enthesopathies of unspecified lower limb, excluding foot: Secondary | ICD-10-CM | POA: Insufficient documentation

## 2011-03-31 DIAGNOSIS — IMO0002 Reserved for concepts with insufficient information to code with codable children: Secondary | ICD-10-CM

## 2011-03-31 DIAGNOSIS — M47817 Spondylosis without myelopathy or radiculopathy, lumbosacral region: Secondary | ICD-10-CM | POA: Insufficient documentation

## 2011-04-01 ENCOUNTER — Encounter (INDEPENDENT_AMBULATORY_CARE_PROVIDER_SITE_OTHER): Payer: Medicare Other | Admitting: Psychiatry

## 2011-04-01 DIAGNOSIS — F331 Major depressive disorder, recurrent, moderate: Secondary | ICD-10-CM

## 2011-04-01 NOTE — Procedures (Signed)
[name], [name]                ACCOUNT NO.:  [id number]  MEDICAL RECORD NO.:  [id number]           PATIENT TYPE:  O  LOCATION:  [facility]                          FACILITY:  [facility]  PHYSICIAN:  Erick Colace, M.D.DATE OF BIRTH:  1980-10-18  DATE OF PROCEDURE:  03/31/2011 DATE OF DISCHARGE:                              OPERATIVE REPORT  PROCEDURE:  Right S1 transforaminal lumbar epidural steroid injection under fluoroscopic guidance.  INDICATIONS:  Lumbar radicular pain, history of lumbar stenosis, pain is only partial response to medication management including narcotic analgesics interferes with activities such as walking.  Informed consent was obtained after describing risks and benefits of the procedure with the patient, these include bleeding, bruising, and infection.  She elects to proceed and has given written consent.  The patient placed prone on fluoroscopy table.  Time-out taken to assure proper patient and procedure.  The patient placed prone on fluoroscopy table.  Betadine prep, sterile drape, 25-gauge inch half needle was used to anesthetize skin and subcu tissue 1% lidocaine x2 mL.  Then, 22- gauge, 5 inch spinal needle was inserted in the right S1 foramen.  AP imaging, laterals could not be obtained secondary to her obesity. Omnipaque 180 did show epidural spread, no intravascular uptake followed by injection of 1 mL of 10 mg/mL dexamethasone and 2 mL of 1% MPF lidocaine.  The patient tolerated procedure well.  Pre and post injection vitals stable.  Post injection instructions given.     Erick Colace, M.D. Electronically Signed    AEK/MEDQ  D:  03/31/2011 10:27:18  T:  03/31/2011 15:27:52  Job:  [id number]

## 2011-04-07 ENCOUNTER — Encounter (INDEPENDENT_AMBULATORY_CARE_PROVIDER_SITE_OTHER): Payer: Medicare Other | Admitting: Psychiatry

## 2011-04-07 DIAGNOSIS — F39 Unspecified mood [affective] disorder: Secondary | ICD-10-CM

## 2011-04-30 ENCOUNTER — Encounter
Payer: Medicare Other | Attending: Physical Medicine and Rehabilitation | Admitting: Physical Medicine and Rehabilitation

## 2011-04-30 DIAGNOSIS — M76899 Other specified enthesopathies of unspecified lower limb, excluding foot: Secondary | ICD-10-CM | POA: Insufficient documentation

## 2011-04-30 DIAGNOSIS — M545 Low back pain, unspecified: Secondary | ICD-10-CM | POA: Insufficient documentation

## 2011-04-30 DIAGNOSIS — Z87891 Personal history of nicotine dependence: Secondary | ICD-10-CM | POA: Insufficient documentation

## 2011-04-30 DIAGNOSIS — R269 Unspecified abnormalities of gait and mobility: Secondary | ICD-10-CM | POA: Insufficient documentation

## 2011-04-30 DIAGNOSIS — M48061 Spinal stenosis, lumbar region without neurogenic claudication: Secondary | ICD-10-CM

## 2011-04-30 DIAGNOSIS — G894 Chronic pain syndrome: Secondary | ICD-10-CM

## 2011-04-30 DIAGNOSIS — M1710 Unilateral primary osteoarthritis, unspecified knee: Secondary | ICD-10-CM

## 2011-04-30 DIAGNOSIS — M79609 Pain in unspecified limb: Secondary | ICD-10-CM

## 2011-04-30 DIAGNOSIS — M542 Cervicalgia: Secondary | ICD-10-CM | POA: Insufficient documentation

## 2011-04-30 NOTE — Assessment & Plan Note (Signed)
[name] is a pleasant 30 year old morbidly obese African American woman who was last seen by me on Oct 21, 2010.  In the interim, she has had epidural steroid injection.  Her pain went from a 7 down to a 1.  She continues to use the Vicodin twice a day as well as gabapentin 40 mg twice a day.  She has no new medical problems today.  She continues to report average pain between 5 and 7 at this point, mainly in her low back.  Her leg pain has significantly improved after the epidural steroid injection.  She has limited ambulation capacity, can go about 5 minutes.  Her low back inhibits her.  She is able to climb stairs and drive.  She is independent with feeding, needs some assistance with dressing, bathing, and toileting, as well as meal prep.  REVIEW OF SYSTEMS:  Otherwise negative.  Past medical, social, family history unchanged other than she notes that she has quit smoking about 4 months ago.  Exam; her blood pressure is 143/94, pulse 72, respirations 18, 97% saturated on room air.  She is a 5 foot 2 inches and weighs 358 pounds. She is oriented x3.  Speech is clear.  Affect is bright.  She is alert, cooperative, and pleasant.  Follows commands without difficulty. Answers my questions appropriately.  Cranial nerves, coordination are intact.  Her reflexes are diminished in her lower extremities without abnormal tone, clonus or tremors.  Her motor strength is overall good in both lower extremities with hip flexion, knee extension, dorsiflexion, and plantar flexion.  She is able to exit the chair without significant difficulty.  Her gait is a wide- based gait with short stride length unchanged from previous exams.  She has limitations in lumbar motion in all planes and complains of pain with forward flexion as well as extension.  IMPRESSION: 1. L4-5 significant spinal stenosis 8 mm at this level.  Also some     stenosis at L5-S1. 2. Gait disorder. 3. Trochanteric  bursitis. 4. Morbid obesity. 5. Bladder problems apparently are resolved.  She is no longer being     followed by Urology. 6. Intermittent cervicalgia.  PLAN:  Recommend physical therapy to address mild gait disorder.  She declines she states "last time I went to PT, they hurt me so bad" "I do not have a good taste in my mouth from PT."  PLAN:  We will refill her Vicodin 5/500 one p.o. daily, gabapentin continue 400 mg 1 p.o. b.i.d.  Her last urine drug screen was March 31, 2011, and was consistent.  Her pill counts are appropriate today.  I have answered all her questions.  She is comfortable with treatment plan.  She is not interested in physical therapy.  She also mentions she has some problems with her knees at the end of the visit.  She does have some mild joint line tenderness and she has some known patellofemoral changes but she declines any type of rehabilitation regarding this.  We will have a nurse practitioner see her next month.  I will see her back in 2 months.  We will continue to monitor her pain medication.    Brantley Stage, M.D. Electronically Signed   DMK/MedQ D:  04/30/2011 13:04:21  T:  04/30/2011 18:54:10  Job #:  [id number]

## 2011-05-03 ENCOUNTER — Other Ambulatory Visit (HOSPITAL_COMMUNITY): Payer: Self-pay

## 2011-05-05 ENCOUNTER — Ambulatory Visit (INDEPENDENT_AMBULATORY_CARE_PROVIDER_SITE_OTHER): Payer: Medicare Other | Admitting: Psychiatry

## 2011-05-05 ENCOUNTER — Encounter (HOSPITAL_COMMUNITY): Payer: Self-pay | Admitting: Psychiatry

## 2011-05-05 DIAGNOSIS — F39 Unspecified mood [affective] disorder: Secondary | ICD-10-CM

## 2011-05-05 NOTE — Patient Instructions (Signed)
Improve self-care regarding nutrition

## 2011-05-05 NOTE — Progress Notes (Signed)
Patient:  [name]   DOB: Sep 06, 1980  MR Number: [id number]  [facility]  [address] [place] [place],  [place], [address]  Start: Monday, 05/05/2011 8:30 AM End: Monday, 05/05/2011 9:30 AM  Provider/Observer:     Florencia Reasons, MSW, LCSW   Chief Complaint:      Chief Complaint  Patient presents with  . Depression  . Anxiety    Reason For Service:     The patient was referred for services by psychiatrist Dr. Lolly Mustache to address trauma history and improve coping skills. The patient has a long-standing history of symptoms of anxiety and depression beginning in adolescence with symptoms worsening in the past several years. Patient has multiple health issues and states being diagnosed with uterine cancer in March 2011 resulting in patient having a total hysterectomy in April 2011. Patient also reports a history of violent thoughts towards self and periods of rage and anger.  Interventions Strategy:  Supportive therapy, cognitive behavioral therapy  Participation Level:   Active  Participation Quality:  Appropriate      Behavioral Observation:  Well Groomed, Alert, and Appropriate.   Current Psychosocial Factors: The patient continues to experience stress related to her chronic health conditions including increased pain. She also reports concern with a rapid weight gain in the past 2 weeks.  Content of Session:   Reviewing symptoms, identifying ways to improve self-care  Current Status:   Patient reports experiencing increased emotionality and increased crying spells.  Patient Progress:   Good. Patient reports her mood has been stable overall. However she reports experiencing increased emotionality and crying spells about a week ago. She attributes this to hormonal changes. She expresses concern that she gained 11 pounds in a 2 week period. She attributes this to medication. Patient reports experiencing increased pain due to the weight gain. She also reports being  less physically active due to the pain. There is worse with patient to identify ways to improve self-care regarding nutrition.  Target Goals:   1. Improve mood as evidenced by smiling more, resuming normal interest in activities, and decreasing emotional outbursts. 2. Improve ability to set and maintain boundaries as well as assertiveness skills. 3 improve coping skills.  Last Reviewed:   03/10/2011  Goals Addressed Today:    Improve mood  Impression/Diagnosis:   Patient presents with a long-standing history of anxiety, depression, and mood swings along with periods of rage. She also has experienced social withdrawal, crying spells and negative thoughts. Patient also has multiple health problems including degenerative disc disease and fibromyalgia. Diagnosis mood disorder NOS,  rule out major depressive disorder, rule out PTSD  Diagnosis:  Axis I:  1. Mood disorder             Axis II: Deferred

## 2011-05-27 ENCOUNTER — Encounter (HOSPITAL_COMMUNITY): Payer: Self-pay | Admitting: Psychiatry

## 2011-05-27 ENCOUNTER — Ambulatory Visit (INDEPENDENT_AMBULATORY_CARE_PROVIDER_SITE_OTHER): Payer: Medicare Other | Admitting: Psychiatry

## 2011-05-27 ENCOUNTER — Encounter (HOSPITAL_COMMUNITY): Payer: Medicare Other | Admitting: Psychiatry

## 2011-05-27 VITALS — Wt 358.0 lb

## 2011-05-27 DIAGNOSIS — F329 Major depressive disorder, single episode, unspecified: Secondary | ICD-10-CM

## 2011-05-27 MED ORDER — ARIPIPRAZOLE 20 MG PO TABS: 20.0000 mg | ORAL_TABLET | Freq: Every day | ORAL | Status: DC

## 2011-05-27 MED ORDER — FLUOXETINE HCL 40 MG PO CAPS: 40.0000 mg | ORAL_CAPSULE | Freq: Every day | ORAL | Status: DC

## 2011-05-27 NOTE — Progress Notes (Signed)
Patient came for her followup appointment. She has been compliant with her Abilify and Prozac. Her depression has been stable. She denies any agitation anger or any recent crying spells. She's been seeing therapist regularly. She has gained weight admitted that she is less involved in physical activity. She has seen her primary care physician who did not change any medication. She denies any paranoia or hallucination. She continued to endorse at times social isolation and stays in her room. She likes her current medication and reported no side effects or concern about that medication. She denies any tremors shakes or extrapyramidal side effects. She wants to continue on current medication.  Mental status examination Patient is morbid obese casually dressed maintained good eye contact. Her speech is soft and slow but clear and coherent. She denies any active or passive suicidal thoughts or homicidal thoughts. She denies any visual or auditory hallucination. Her attention and concentration is fair. Her thought process is slow but logical. She's alert and oriented x3. Her insight judgment and impulse control is okay.  Assessment Major depressive disorder  Plan I will continue her current medication Abilify 20 mg and Prozac 40 mg daily. I encourage her to more involved in her daily routine and do regular exercise. She will continue to see therapist regularly. I have explained risks and benefits of medication. I will see her again in 2 months.

## 2011-05-28 ENCOUNTER — Encounter: Payer: Medicare Other | Attending: Neurosurgery | Admitting: Neurosurgery

## 2011-05-28 DIAGNOSIS — M79609 Pain in unspecified limb: Secondary | ICD-10-CM | POA: Insufficient documentation

## 2011-05-28 DIAGNOSIS — M48061 Spinal stenosis, lumbar region without neurogenic claudication: Secondary | ICD-10-CM | POA: Insufficient documentation

## 2011-05-28 DIAGNOSIS — M545 Low back pain, unspecified: Secondary | ICD-10-CM | POA: Insufficient documentation

## 2011-05-28 DIAGNOSIS — M76899 Other specified enthesopathies of unspecified lower limb, excluding foot: Secondary | ICD-10-CM

## 2011-05-28 DIAGNOSIS — R269 Unspecified abnormalities of gait and mobility: Secondary | ICD-10-CM | POA: Insufficient documentation

## 2011-05-29 NOTE — Assessment & Plan Note (Signed)
This is a patient of Dr. Pamelia Hoit that is seen status post epidural steroid injection.  She does have some back and right lower extremity pain.  She reports her pain unchanged at 7.  It is a sharp, burning, stabbing, aching type pain that is constant.  General activity level is 7 to an 8.  Pain is same 24 hours a day.  Sleep patterns are fair. Walking, bending, sitting, and standing tend to aggravate her. Medication and injections help.  She walks with a cane, walker, or independently she can walk up to 5 minutes at a time.  She can climb steps and drive.  She uses a wheelchair from time to time, she transfers alone.  Functionally, she is disabled. She needs help with all ADLs and household duties.  REVIEW OF SYSTEMS:  Notable for difficulties described above, otherwise unremarkable.  PAST MEDICAL HISTORY/SOCIAL HISTORY/FAMILY HISTORY:  Unchanged.  PHYSICAL EXAMINATION:  Her blood pressure is 139/82, pulse 86, respirations 18, O2 sats 96 on room air.  Motor strength and sensation are intact.  Constitutionally, she is morbidly obese.  She is alert and oriented x3.  She has a slight limp to her gait, using a single-point cane.  IMPRESSION: 1. L4-5 spinal stenosis. 2. Gait disorder. 3. History of trochanter bursitis. 4. Morbid obesity.  PLAN:  Refill of Vicodin 5/500, 1 p.o. daily, #30 with no refill.  Her questions were encouraged and answered.  We will see her back in a month.     Chirstine Defrain L. Blima Dessert Electronically Signed    RLW/MedQ D:  05/28/2011 10:18:27  T:  05/29/2011 02:02:01  Job #:  [id number]

## 2011-05-30 ENCOUNTER — Other Ambulatory Visit (HOSPITAL_COMMUNITY): Payer: Self-pay | Admitting: Psychiatry

## 2011-06-02 ENCOUNTER — Encounter (HOSPITAL_COMMUNITY): Payer: Self-pay | Admitting: Psychiatry

## 2011-06-02 ENCOUNTER — Ambulatory Visit (INDEPENDENT_AMBULATORY_CARE_PROVIDER_SITE_OTHER): Payer: Medicare Other | Admitting: Psychiatry

## 2011-06-02 DIAGNOSIS — F39 Unspecified mood [affective] disorder: Secondary | ICD-10-CM
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
## 2011-06-02 NOTE — Patient Instructions (Signed)
Use coping and relaxation techniques - relaxation breathing, music, reading, resume routines

## 2011-06-16 ENCOUNTER — Encounter (HOSPITAL_COMMUNITY): Payer: Self-pay | Admitting: Psychiatry

## 2011-06-16 ENCOUNTER — Ambulatory Visit (INDEPENDENT_AMBULATORY_CARE_PROVIDER_SITE_OTHER): Payer: Medicare Other | Admitting: Psychiatry

## 2011-06-16 DIAGNOSIS — F39 Unspecified mood [affective] disorder: Secondary | ICD-10-CM

## 2011-06-16 NOTE — Patient Instructions (Signed)
Discussed orally 
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
## 2011-07-02 ENCOUNTER — Ambulatory Visit: Payer: Medicare Other | Admitting: Physical Medicine and Rehabilitation

## 2011-07-11 ENCOUNTER — Encounter
Payer: Medicare Other | Attending: Physical Medicine and Rehabilitation | Admitting: Physical Medicine and Rehabilitation

## 2011-07-11 DIAGNOSIS — G894 Chronic pain syndrome: Secondary | ICD-10-CM

## 2011-07-11 DIAGNOSIS — M48061 Spinal stenosis, lumbar region without neurogenic claudication: Secondary | ICD-10-CM | POA: Insufficient documentation

## 2011-07-11 DIAGNOSIS — M25569 Pain in unspecified knee: Secondary | ICD-10-CM | POA: Insufficient documentation

## 2011-07-11 DIAGNOSIS — M76899 Other specified enthesopathies of unspecified lower limb, excluding foot: Secondary | ICD-10-CM | POA: Insufficient documentation

## 2011-07-11 DIAGNOSIS — M545 Low back pain, unspecified: Secondary | ICD-10-CM

## 2011-07-11 DIAGNOSIS — M1710 Unilateral primary osteoarthritis, unspecified knee: Secondary | ICD-10-CM

## 2011-07-11 DIAGNOSIS — M79609 Pain in unspecified limb: Secondary | ICD-10-CM

## 2011-07-11 DIAGNOSIS — R269 Unspecified abnormalities of gait and mobility: Secondary | ICD-10-CM | POA: Insufficient documentation

## 2011-07-11 NOTE — Assessment & Plan Note (Signed)
Mr. [name] is a pleasant 31 year old African American woman who is followed at our [facility] for chronic pain complaints which are related to her low back and her lower extremities.  Her average pain is between 6 and 8 on a scale of 10.  She had recent epidural steroid injection and had significant improvement in her pain for about a month.  Her right leg pain is not constant anymore.  Her pain in her legs occurs after walking about 2 minutes.  She gets pain from the low back all the way to the feet when she is up much longer than 2-3 minutes.  She tells me she was recently diagnosed not only with COPD, but with a restrictive pulmonary disease as well.  She tells me her pulmonologist, Dr. Blase Mess recommended consideration of bariatric surgery consult.  FUNCTIONAL STATUS:  She can walk 2 minutes.  She uses a cane.  She is able to climb stairs.  She does drive.  She is independent with feeding, needs assistance with many other functional tasks.  She has no problems with bowel or bladder.  Denies depression, anxiety, or suicidal ideation.  She does complain of some shortness of breath, coughing, and wheezing.  She maintains contact with primary care and Pulmonology for these problems.  SOCIAL AND FAMILY HISTORY:  Otherwise unchanged.  PHYSICAL EXAMINATION:  Blood pressure is 144/67, pulse 84, respiration 18, 96% saturated on room air.  She is 359 pounds and 62 inches tall. She is a morbidly obese woman who does not appear in any distress.  She is oriented x3.  Speech is clear.  Affect is bright.  She is alert, cooperative, and pleasant.  Follows commands without difficulty. Answers my questions appropriately.  Cranial nerves, coordination are grossly intact.  Coordination is intact.  Reflexes are diminished in the lower extremities without abnormal tone, clonus, or tremors.  Motor strength in lower extremities is 5/5.  She is able to  transition from sitting to standing slowly.  Her gait reveals short stride length, wide base gait due to her habitus.  Balance is well preserved.  Limitations in lumbar motion are noted in all planes.  IMPRESSION: 1. L4-5 significant spinal stenosis, 8 mm at this level; also stenosis     at L5-S1. 2. Mild gait disorder. 3. History of trochanteric bursitis, stable. 4. Morbid obesity. 5. Bilateral knee pain consistent with osteoarthritis.  X-rays of bilateral knees were done March 25, 2011, showing changes consistent with mild patellofemoral joint degenerative changes.  PLAN:  The patient is reluctant to consider physical therapy.  She is thinking about bariatric surgery at this point.  She would like to pursue repeat epidural steroid injection.  She got about a month's relief with the last one.  She does not need a refill on her gabapentin today.  She takes 400 mg twice a day and finds this to be adequate.  She is on Vicodin, takes about 1 tablet 5/500 per day.  She takes her medications as prescribed.  No evidence of aberrant behavior.  We will continue to monitor her narcotic pain medication usage.  Her last urine drug screen was March 31, 2011, and was consistent.  I have answered all of her questions.  She is comfortable with our plan at this time.     Brantley Stage, M.D.    DMK/MedQ D:  07/11/2011 09:50:57  T:  07/11/2011 11:29:20  Job #:  [id number]

## 2011-07-14 ENCOUNTER — Encounter (HOSPITAL_COMMUNITY): Payer: Self-pay | Admitting: Psychiatry

## 2011-07-14 ENCOUNTER — Ambulatory Visit (INDEPENDENT_AMBULATORY_CARE_PROVIDER_SITE_OTHER): Payer: Medicare Other | Admitting: Psychiatry

## 2011-07-14 DIAGNOSIS — F39 Unspecified mood [affective] disorder: Secondary | ICD-10-CM

## 2011-07-14 NOTE — Progress Notes (Addendum)
Patient:  [name]   DOB: [birth year]  MR Number: [id number]  Location: [facility]:  [address] [place],  [place], [address]  Start: Monday, 07/14/2011  9:35 AM End: Monday, 07/14/2011 10:20 AM  Provider/Observer:     Florencia Reasons, MSW, LCSW   Chief Complaint:      Chief Complaint  Patient presents with  . Anxiety  . Depression    Reason For Service:     The patient was referred for services by psychiatrist Dr. Lolly Mustache to address trauma history and improve coping skills. The patient has a long-standing history of symptoms of anxiety and depression beginning in adolescence with symptoms worsening in the past several years. Patient has multiple health issues and states being diagnosed with uterine cancer in March 2011 resulting in patient having a total hysterectomy in April 2011. Patient also reports a history of violent thoughts towards self and periods of rage and anger.  Interventions Strategy:  Supportive therapy, cognitive behavioral therapy  Participation Level:   Active  Participation Quality:  Appropriate      Behavioral Observation:  Well Groomed, Fatigued, and Appropriate.   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  Content of Session:   Reviewing symptoms, identifying ways to improve sleep hygiene, identifying relaxation and coping techniques.  Current Status:   Patient reports increased anxiety rating the level at a 6 on a 10 point scale and increased depressed mood rating the level at a 10 on a 10 point scale. The patient also reports increased sleep difficulty and memory difficulty.  Patient Progress:   Fair. 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 She also reports most of her medical providers have recommended she have weight loss surgery. Patient expresses fear of having surgery and states she does not think she has become commitment to change her  eating patterns. Patient reports becoming more withdrawn and more isolated. She has resumed old coping mechanisms including buying sprees on the computer.   Target Goals:   1. Improve mood as evidenced by smiling more, resuming normal interest in activities, and decreasing emotional outbursts. 2. Improve ability to set and maintain boundaries as well as assertiveness skills. 3 improve coping skills.  Last Reviewed:   03/10/2011  Goals Addressed Today:    Improve coping skills  Impression/Diagnosis:   Patient presents with a long-standing history of anxiety, depression, and mood swings along with periods of rage. She also has experienced social withdrawal, crying spells and negative thoughts. Patient also has multiple health problems including degenerative disc disease and fibromyalgia. Diagnosis mood disorder NOS,  rule out major depressive disorder, rule out PTSD  Diagnosis:  Axis I:  1. Mood disorder             Axis II: Deferred

## 2011-07-14 NOTE — Patient Instructions (Signed)
Use coping skills - reading, listening to music: improve sleep hygiene and stimulus control (reduce caffeine use  particularly in the evening)

## 2011-07-17 ENCOUNTER — Encounter: Payer: Medicare Other | Admitting: Physical Medicine & Rehabilitation

## 2011-07-17 DIAGNOSIS — IMO0002 Reserved for concepts with insufficient information to code with codable children: Secondary | ICD-10-CM

## 2011-07-17 NOTE — Procedures (Signed)
NAMEKIMIA, FINAN                ACCOUNT NO.:  [id number]  MEDICAL RECORD NO.:  [id number]           PATIENT TYPE:  O  LOCATION:  [facility]                          FACILITY:  [facility]  PHYSICIAN:  Erick Colace, M.D.DATE OF BIRTH:  [birth year]  DATE OF PROCEDURE: DATE OF DISCHARGE:                              OPERATIVE REPORT  PROCEDURE:  Right S1 transforaminal lumbar epidural steroid injection under fluoroscopic guidance.  INDICATION:  Right greater left lower extremity pain.  She has had one month relief with the prior S1 transforaminal injection.  Informed consent was obtained after describing risks and benefits of the procedure with the patient.  These include bleeding, bruising, and infection.  She elects to proceed and has given written consent.  The patient was placed prone on fluoroscopy table.  Betadine prep, sterile drape, 25-gauge inch and half needle was used to anesthetize the skin and subcu tissue with 1% lidocaine x2 mL.  Then, 22-gauge, 5-inch spinal needle was inserted in the right S1 foramen.  AP images used.  Laterals were attempted, however, due to her morbid obesity could not obtain. Initial Omnipaque showed no epidural spread and after needle was readjusted and showed some foraminal spread and no evidence of intravascular spread.  Then, a solution containing 1 mL of 10 mg/mL dexamethasone and 2 mL of 1% MPF lidocaine were injected.  The patient tolerated the procedure well.  Postprocedure instructions were given.     Erick Colace, M.D. Electronically Signed    AEK/MEDQ  D:  07/17/2011 10:31:45  T:  07/17/2011 10:42:27  Job:  [id number]

## 2011-07-29 ENCOUNTER — Encounter (HOSPITAL_COMMUNITY): Payer: Self-pay | Admitting: Psychiatry

## 2011-07-29 ENCOUNTER — Ambulatory Visit (INDEPENDENT_AMBULATORY_CARE_PROVIDER_SITE_OTHER): Payer: Medicare Other | Admitting: Psychiatry

## 2011-07-29 VITALS — Wt 366.0 lb

## 2011-07-29 DIAGNOSIS — F4310 Post-traumatic stress disorder, unspecified: Secondary | ICD-10-CM

## 2011-07-29 DIAGNOSIS — F329 Major depressive disorder, single episode, unspecified: Secondary | ICD-10-CM

## 2011-07-29 DIAGNOSIS — F0630 Mood disorder due to known physiological condition, unspecified: Secondary | ICD-10-CM

## 2011-07-29 DIAGNOSIS — F323 Major depressive disorder, single episode, severe with psychotic features: Secondary | ICD-10-CM
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
## 2011-08-07 ENCOUNTER — Ambulatory Visit (INDEPENDENT_AMBULATORY_CARE_PROVIDER_SITE_OTHER): Payer: Medicare Other | Admitting: Psychiatry

## 2011-08-07 ENCOUNTER — Encounter (HOSPITAL_COMMUNITY): Payer: Self-pay | Admitting: Psychiatry

## 2011-08-07 VITALS — Wt 362.0 lb

## 2011-08-07 DIAGNOSIS — F329 Major depressive disorder, single episode, unspecified: Secondary | ICD-10-CM

## 2011-08-07 MED ORDER — ARIPIPRAZOLE 30 MG PO TABS: 30.0000 mg | ORAL_TABLET | Freq: Every day | ORAL | Status: DC

## 2011-08-07 MED ORDER — FLUOXETINE HCL 40 MG PO CAPS: 40.0000 mg | ORAL_CAPSULE | Freq: Every day | ORAL | Status: DC
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
## 2011-08-08 ENCOUNTER — Encounter: Payer: Medicare Other | Attending: Physical Medicine and Rehabilitation | Admitting: *Deleted

## 2011-08-08 VITALS — BP 192/109 | HR 72 | Resp 20 | Ht 62.0 in | Wt 366.0 lb

## 2011-08-08 DIAGNOSIS — G894 Chronic pain syndrome: Secondary | ICD-10-CM

## 2011-08-08 DIAGNOSIS — IMO0002 Reserved for concepts with insufficient information to code with codable children: Secondary | ICD-10-CM

## 2011-08-08 DIAGNOSIS — M79609 Pain in unspecified limb: Secondary | ICD-10-CM | POA: Insufficient documentation

## 2011-08-08 DIAGNOSIS — IMO0001 Reserved for inherently not codable concepts without codable children: Secondary | ICD-10-CM

## 2011-08-08 MED ORDER — HYDROCODONE-ACETAMINOPHEN 5-500 MG PO TABS: 1.0000 | ORAL_TABLET | Freq: Every day | ORAL | Status: DC | PRN

## 2011-08-08 NOTE — Patient Instructions (Signed)
Dr Alonza Smoker office notified by phone of your blood pressure reading today. They will call you at home with further instructions.

## 2011-08-08 NOTE — Progress Notes (Signed)
Long wait in lobby today made her feel "panicky" BP elevated. Feels like her heart is beating "hard". Radial pulse normal quality. States her fibromyalgia makes her feel like she's been hit by a truck, all the time. Reports about 1 week relief after SI joint injection last month.

## 2011-08-11 ENCOUNTER — Ambulatory Visit (HOSPITAL_COMMUNITY): Payer: Medicare Other | Admitting: Psychiatry

## 2011-08-13 ENCOUNTER — Encounter (HOSPITAL_COMMUNITY): Payer: Self-pay | Admitting: *Deleted

## 2011-08-13 ENCOUNTER — Emergency Department (HOSPITAL_COMMUNITY): Payer: Medicare Other

## 2011-08-13 ENCOUNTER — Other Ambulatory Visit: Payer: Self-pay

## 2011-08-13 ENCOUNTER — Observation Stay (HOSPITAL_COMMUNITY)
Admission: EM | Admit: 2011-08-13 | Discharge: 2011-08-14 | Disposition: A | Discharge status: Home / Self Care | Payer: Medicare Other | Attending: Internal Medicine | Admitting: Internal Medicine

## 2011-08-13 DIAGNOSIS — M4800 Spinal stenosis, site unspecified: Secondary | ICD-10-CM | POA: Insufficient documentation

## 2011-08-13 DIAGNOSIS — R079 Chest pain, unspecified: Principal | ICD-10-CM | POA: Insufficient documentation

## 2011-08-13 DIAGNOSIS — F329 Major depressive disorder, single episode, unspecified: Secondary | ICD-10-CM

## 2011-08-13 DIAGNOSIS — R110 Nausea: Secondary | ICD-10-CM | POA: Insufficient documentation

## 2011-08-13 DIAGNOSIS — G4730 Sleep apnea, unspecified: Secondary | ICD-10-CM | POA: Insufficient documentation

## 2011-08-13 DIAGNOSIS — F17200 Nicotine dependence, unspecified, uncomplicated: Secondary | ICD-10-CM | POA: Insufficient documentation

## 2011-08-13 DIAGNOSIS — E119 Type 2 diabetes mellitus without complications: Secondary | ICD-10-CM | POA: Insufficient documentation

## 2011-08-13 DIAGNOSIS — I10 Essential (primary) hypertension: Secondary | ICD-10-CM | POA: Insufficient documentation

## 2011-08-13 DIAGNOSIS — R0602 Shortness of breath: Secondary | ICD-10-CM | POA: Insufficient documentation

## 2011-08-13 DIAGNOSIS — E785 Hyperlipidemia, unspecified: Secondary | ICD-10-CM | POA: Insufficient documentation

## 2011-08-13 DIAGNOSIS — J45909 Unspecified asthma, uncomplicated: Secondary | ICD-10-CM | POA: Insufficient documentation

## 2011-08-13 DIAGNOSIS — IMO0001 Reserved for inherently not codable concepts without codable children: Secondary | ICD-10-CM | POA: Insufficient documentation

## 2011-08-13 HISTORY — DX: Chronic obstructive pulmonary disease, unspecified: J44.9

## 2011-08-13 LAB — CBC
Hemoglobin: 11.5 g/dL — ABNORMAL LOW (ref 12.0–15.0)
MCH: 27.3 pg (ref 26.0–34.0)
MCHC: 32.1 g/dL (ref 30.0–36.0)
MCV: 84.8 fL (ref 78.0–100.0)

## 2011-08-13 LAB — BASIC METABOLIC PANEL
BUN: 10 mg/dL (ref 6–23)
Calcium: 9.4 mg/dL (ref 8.4–10.5)
Creatinine, Ser: 0.72 mg/dL (ref 0.50–1.10)
GFR calc Af Amer: >90 mL/min (ref >90)
GFR calc non Af Amer: >90 mL/min (ref >90)
Potassium: 4 mEq/L (ref 3.5–5.1)

## 2011-08-13 LAB — DIFFERENTIAL
Basophils Relative: 0 % (ref 0–1)
Eosinophils Absolute: 0.2 10*3/uL (ref 0.0–0.7)
Eosinophils Relative: 2 % (ref 0–5)
Monocytes Absolute: 0.7 10*3/uL (ref 0.1–1.0)
Monocytes Relative: 7 % (ref 3–12)
Neutrophils Relative %: 61 % (ref 43–77)

## 2011-08-13 MED ORDER — SODIUM CHLORIDE 0.9 % IJ SOLN
3.0000 mL | Freq: Two times a day (BID) | INTRAMUSCULAR | Status: DC
  Administered 2011-08-14 (×2): 3 mL via INTRAVENOUS
  Filled 2011-08-13 (×2): qty 3

## 2011-08-13 MED ORDER — NITROGLYCERIN 0.4 MG/HR TD PT24
0.4000 mg | MEDICATED_PATCH | Freq: Every day | TRANSDERMAL | Status: DC
  Administered 2011-08-14: 0.4 mg via TRANSDERMAL
  Filled 2011-08-13: qty 1

## 2011-08-13 MED ORDER — ONDANSETRON HCL 4 MG PO TABS: 4.0000 mg | ORAL_TABLET | Freq: Four times a day (QID) | ORAL | Status: DC | PRN

## 2011-08-13 MED ORDER — ONDANSETRON HCL 4 MG/2ML IJ SOLN
4.0000 mg | Freq: Four times a day (QID) | INTRAMUSCULAR | Status: DC | PRN
Start: 2011-08-13 — End: 2011-08-14

## 2011-08-13 MED ORDER — SODIUM CHLORIDE 0.9 % IJ SOLN: 3.0000 mL | INTRAMUSCULAR | Status: DC | PRN

## 2011-08-13 MED ORDER — SODIUM CHLORIDE 0.9 % IV SOLN: 250.0000 mL | INTRAVENOUS | Status: DC | PRN

## 2011-08-13 MED ORDER — FLUOXETINE HCL 20 MG PO CAPS
40.0000 mg | ORAL_CAPSULE | Freq: Every day | ORAL | Status: DC
  Administered 2011-08-14: 40 mg via ORAL
  Filled 2011-08-13: qty 2

## 2011-08-13 MED ORDER — NITROGLYCERIN 0.4 MG SL SUBL: 0.4000 mg | SUBLINGUAL_TABLET | SUBLINGUAL | Status: DC | PRN

## 2011-08-13 MED ORDER — ALBUTEROL SULFATE HFA 108 (90 BASE) MCG/ACT IN AERS: 2.0000 | INHALATION_SPRAY | Freq: Four times a day (QID) | RESPIRATORY_TRACT | Status: DC | PRN

## 2011-08-13 MED ORDER — INSULIN ASPART 100 UNIT/ML ~~LOC~~ SOLN: 0.0000 [IU] | Freq: Three times a day (TID) | SUBCUTANEOUS | Status: DC

## 2011-08-13 MED ORDER — ACETAMINOPHEN 650 MG RE SUPP: 650.0000 mg | Freq: Four times a day (QID) | RECTAL | Status: DC | PRN

## 2011-08-13 MED ORDER — FENTANYL CITRATE 0.05 MG/ML IJ SOLN
50.0000 ug | Freq: Once | INTRAMUSCULAR | Status: AC
  Administered 2011-08-13: 50 ug via INTRAVENOUS
  Filled 2011-08-13: qty 2

## 2011-08-13 MED ORDER — HYDROCODONE-ACETAMINOPHEN 5-325 MG PO TABS
1.0000 | ORAL_TABLET | ORAL | Status: DC | PRN
  Administered 2011-08-14: 1 via ORAL
  Administered 2011-08-14: 2 via ORAL
  Filled 2011-08-13 (×2): qty 2

## 2011-08-13 MED ORDER — INSULIN ASPART 100 UNIT/ML ~~LOC~~ SOLN
0.0000 [IU] | Freq: Every day | SUBCUTANEOUS | Status: DC
  Filled 2011-08-13: qty 3

## 2011-08-13 MED ORDER — ASPIRIN EC 325 MG PO TBEC
325.0000 mg | DELAYED_RELEASE_TABLET | Freq: Every day | ORAL | Status: DC
  Administered 2011-08-14: 325 mg via ORAL
  Filled 2011-08-13: qty 1

## 2011-08-13 MED ORDER — ENOXAPARIN SODIUM 40 MG/0.4ML ~~LOC~~ SOLN
40.0000 mg | SUBCUTANEOUS | Status: DC
  Filled 2011-08-13: qty 0.4

## 2011-08-13 MED ORDER — METOPROLOL TARTRATE 50 MG PO TABS
50.0000 mg | ORAL_TABLET | Freq: Two times a day (BID) | ORAL | Status: DC
  Administered 2011-08-14 (×2): 50 mg via ORAL
  Filled 2011-08-13 (×2): qty 1

## 2011-08-13 MED ORDER — FENTANYL CITRATE 0.05 MG/ML IJ SOLN
100.0000 ug | Freq: Once | INTRAMUSCULAR | Status: AC
  Administered 2011-08-13: 100 ug via INTRAVENOUS
  Filled 2011-08-13: qty 2

## 2011-08-13 MED ORDER — ACETAMINOPHEN 325 MG PO TABS: 650.0000 mg | ORAL_TABLET | Freq: Four times a day (QID) | ORAL | Status: DC | PRN

## 2011-08-13 MED ORDER — LISINOPRIL 10 MG PO TABS
10.0000 mg | ORAL_TABLET | ORAL | Status: DC
  Administered 2011-08-14: 10 mg via ORAL
  Filled 2011-08-13 (×2): qty 1

## 2011-08-13 MED ORDER — ALUM & MAG HYDROXIDE-SIMETH 200-200-20 MG/5ML PO SUSP: 30.0000 mL | Freq: Four times a day (QID) | ORAL | Status: DC | PRN

## 2011-08-13 MED ORDER — ONDANSETRON HCL 4 MG/2ML IJ SOLN
4.0000 mg | Freq: Once | INTRAMUSCULAR | Status: AC
  Administered 2011-08-13: 4 mg via INTRAVENOUS
  Filled 2011-08-13: qty 2

## 2011-08-13 MED ORDER — GABAPENTIN 400 MG PO CAPS
400.0000 mg | ORAL_CAPSULE | Freq: Two times a day (BID) | ORAL | Status: DC
  Administered 2011-08-14 (×2): 400 mg via ORAL
  Filled 2011-08-13 (×2): qty 1

## 2011-08-13 MED ORDER — MORPHINE SULFATE 4 MG/ML IJ SOLN
4.0000 mg | INTRAMUSCULAR | Status: DC | PRN
  Administered 2011-08-14: 4 mg via INTRAVENOUS
  Filled 2011-08-13: qty 1

## 2011-08-13 MED ORDER — ARIPIPRAZOLE 10 MG PO TABS: 30.0000 mg | ORAL_TABLET | Freq: Every day | ORAL | Status: DC

## 2011-08-13 MED ORDER — ATORVASTATIN CALCIUM 40 MG PO TABS: 80.0000 mg | ORAL_TABLET | Freq: Every evening | ORAL | Status: DC

## 2011-08-13 MED ORDER — BUDESONIDE-FORMOTEROL FUMARATE 160-4.5 MCG/ACT IN AERO
2.0000 | INHALATION_SPRAY | Freq: Two times a day (BID) | RESPIRATORY_TRACT | Status: DC
  Administered 2011-08-14: 2 via RESPIRATORY_TRACT
  Filled 2011-08-13: qty 6

## 2011-08-13 MED ORDER — FLUOXETINE HCL 20 MG PO CAPS: 40.0000 mg | ORAL_CAPSULE | Freq: Every evening | ORAL | Status: DC

## 2011-08-13 NOTE — ED Notes (Signed)
Attempted to call report. Will call back for report

## 2011-08-13 NOTE — ED Notes (Signed)
Patient report given to this nurse. Assuming care of patient. Into room to assess. States she start having chest pain in center of chest. Is a constant sharp pain that radiates to right armpit. States nothing makes pain better or worse. She placed a nitro patch and took three sublingual nitros with no relief. s1 and s2 present with no extra sounds. Clear lung sounds in all fields. States she is nauseated but no vomiting or diarrhea. Active bowel sounds in all fields. No tenderness, soft on palpation. Last food\ fluid at 1300 and tolerated well. Last BM was this morning and was normal. Complaints of 7\10 chest pain and nausea. Call bell within reach. Family with patient. MD aware.

## 2011-08-13 NOTE — ED Notes (Signed)
Into room to see patient. Pain 5\10 at this time. States nausea is better. A&O x 4. Call bell within reach. Denies any needs. Will continue to monitor.

## 2011-08-13 NOTE — ED Notes (Signed)
Into room to see patient. Pain remains 5\10. Denies nausea. Denies any needs at this time. Equal chest rise and fall, regular, equal. Family at bedside. Denies needs. Will continue to monitor.

## 2011-08-13 NOTE — ED Notes (Signed)
Resting with eyes open and lights on. Pain 5\10. No nausea. Denies needs. sats 95% on 2L O2 [place]. Call bell within reach. Family with patient.

## 2011-08-13 NOTE — ED Notes (Signed)
Medicated for 5\10 chest pain. Notified to get up and ambulate per MD order shortly to check oxygen saturations. Verbalized understanding. Call bell and family at bedside. Will continue to monitor.

## 2011-08-13 NOTE — ED Notes (Signed)
Into room to see patient. States pain is back up to a 5\10 after walking. No shortness of breath. sats 97% on 2L O2 [employer]. Would like something else for pain. Denies any other needs. Call bell and family at bedside. MD aware.

## 2011-08-13 NOTE — ED Provider Notes (Signed)
This chart was scribed for [employer]. Rubin Payor, MD by Williemae Natter. The patient was seen in room APA01/APA01 at 6:39 PM.  CSN: [id number]  Arrival date & time 08/13/11  1800   First MD Initiated Contact with Patient 08/13/11 1833      Chief Complaint  Patient presents with  . Chest Pain    (Consider location/radiation/quality/duration/timing/severity/associated sxs/prior treatment) Patient is a 31 y.o. female presenting with chest pain.  Chest Pain The chest pain began 6 - 12 hours ago. Chest pain occurs constantly. The chest pain is unchanged. The quality of the pain is described as pressure-like. Pertinent negatives for primary symptoms include no fever. She tried nitroglycerin for the symptoms. Risk factors include obesity and smoking/tobacco exposure.  Her past medical history is significant for cancer, COPD, diabetes and hypertension.    XOIE KREUSER is a 31 y.o. female with a hx of COPD and hypertension who presents to the Emergency Department complaining of acute onset cramping chest pain since noon. Pt feels like her heart is going to beat out of her chest. Pt uses O2 at home on daily basis at night. Pain is constant. Pt treated with sublingual nitroglycerin and a nitroglycerin patch with little to no improvement. Pt reports the pain worsened around 3pm this afternoon. Pt has associated nausea and is short of breath.  Past Medical History  Diagnosis Date  . Fibromyalgia   . Degenerative joint disease   . Hypertension   . Sleep apnea   . Cancer April 2011    Uterine Cancer  . Diabetes mellitus type II   . Lumbago   . Lack of coordination   . Disturbance of skin sensation   . Chronic pain syndrome   . Pain in limb   . Spinal stenosis, lumbar region, without neurogenic claudication   . Enthesopathy of hip region   . Pain in joint, lower leg   . COPD (chronic obstructive pulmonary disease)     Past Surgical History  Procedure Date  . Abdominal hysterectomy   .  Mouth surgery     Wisdom tooth extracted  . Dnc   . Dilation and curettage of uterus   . Orthroscopic surgery     Right knee  . Carpel tunnel release surgery     Family History  Problem Relation Age of Onset  . Bipolar disorder Brother   . Other Brother     PTSD  . Bipolar disorder Maternal Aunt     History  Substance Use Topics  . Smoking status: Former Smoker -- 0.2 packs/day    Quit date: 01/02/2011  . Smokeless tobacco: Never Used  . Alcohol Use: No    OB History    Grav Para Term Preterm Abortions TAB SAB Ect Mult Living                  Review of Systems  Constitutional: Negative for fever.  Cardiovascular: Positive for chest pain.  All other systems reviewed and are negative.    Allergies  Avocado; Flexeril; Marflex; Tramadol; and Codeine  Home Medications   Current Outpatient Rx  Name Route Sig Dispense Refill  . ALBUTEROL SULFATE HFA 108 (90 BASE) MCG/ACT IN AERS Inhalation Inhale 2 puffs into the lungs every 6 (six) hours as needed. For rescue    . ARIPIPRAZOLE 30 MG PO TABS Oral Take 1 tablet (30 mg total) by mouth at bedtime. 1  20mg   PO BedTime 30 tablet 1  . ATORVASTATIN CALCIUM 80 MG  PO TABS Oral Take 80 mg by mouth every evening.     . BUDESONIDE-FORMOTEROL FUMARATE 160-4.5 MCG/ACT IN AERO Inhalation Inhale 2 puffs into the lungs 2 (two) times daily.      Marland Kitchen ESTROGENS CONJUGATED 0.625 MG PO TABS Oral Take 0.625 mg by mouth every morning.     Marland Kitchen FLUOXETINE HCL 40 MG PO CAPS Oral Take 40 mg by mouth every evening.    Marland Kitchen GABAPENTIN 400 MG PO CAPS Oral Take 400 mg by mouth 2 times daily at 12 noon and 4 pm.      . HYDROCODONE-ACETAMINOPHEN 5-500 MG PO TABS Oral Take 1 tablet by mouth at bedtime as needed. For back pain    . KETOCONAZOLE 2 % EX CREA Topical Apply 1 application topically as needed. For irritation    . LISINOPRIL 10 MG PO TABS Oral Take 10 mg by mouth every morning.     Marland Kitchen METFORMIN HCL 500 MG PO TABS Oral Take 500 mg by mouth at bedtime.      Marland Kitchen METOPROLOL TARTRATE 50 MG PO TABS Oral Take 50 mg by mouth 2 (two) times daily.      Marland Kitchen NITROGLYCERIN 0.4 MG/HR TD PT24 Transdermal Place 1 patch onto the skin daily.      Marland Kitchen NITROGLYCERIN 0.4 MG SL SUBL Sublingual Place 0.4 mg under the tongue every 5 (five) minutes as needed.    . TOLTERODINE TARTRATE ER 4 MG PO CP24 Oral Take 4 mg by mouth every morning.       BP 126/93  Pulse 89  Temp(Src) 98.1 F (36.7 C) (Oral)  Resp 16  Ht 5\' 2"  (1.575 m)  Wt 366 lb (166.017 kg)  BMI 66.94 kg/m2  SpO2 99%  Physical Exam  Nursing note and vitals reviewed. Constitutional: She is oriented to person, place, and time. She appears well-developed and well-nourished.       Pt is obese  HENT:  Head: Normocephalic and atraumatic.  Neck: Normal range of motion. Neck supple.  Cardiovascular: Normal rate, regular rhythm and normal heart sounds.   Pulmonary/Chest: Effort normal and breath sounds normal.       Nitro patch on left chest wall. Lungs clear.  Abdominal: Soft. There is no tenderness.       No peripheral edema  Neurological: She is alert and oriented to person, place, and time.  Skin: Skin is warm and dry.  Psychiatric: She has a normal mood and affect. Her behavior is normal.    ED Course  Procedures (including critical care time) DIAGNOSTIC STUDIES: Oxygen Saturation is 98% on [place], normal by my interpretation.    COORDINATION OF CARE:  Medications  nitroGLYCERIN (NITROSTAT) 0.4 MG SL tablet (not administered)  ketoconazole (NIZORAL) 2 % cream (not administered)  albuterol (PROAIR HFA) 108 (90 BASE) MCG/ACT inhaler (not administered)  FLUoxetine (PROZAC) 40 MG capsule (not administered)  HYDROcodone-acetaminophen (VICODIN) 5-500 MG per tablet (not administered)  fentaNYL (SUBLIMAZE) injection 50 mcg (50 mcg Intravenous Given 08/13/11 1936)  ondansetron (ZOFRAN) injection 4 mg (4 mg Intravenous Given 08/13/11 1938)  fentaNYL (SUBLIMAZE) injection 100 mcg (100 mcg Intravenous Given  08/13/11 2038)      Labs Reviewed  CBC - Abnormal; Notable for the following:    Hemoglobin 11.5 (*)    HCT 35.8 (*)    All other components within normal limits  BASIC METABOLIC PANEL - Abnormal; Notable for the following:    Glucose, Bld 164 (*)    All other components within normal limits  DIFFERENTIAL  TROPONIN I   Dg Chest Portable 1 View  08/13/2011  *RADIOLOGY REPORT*  Clinical Data: 31 year old female with chest pain.  PORTABLE CHEST - 1 VIEW  Comparison: 10/05/2010 and earlier.  Findings: Portable semi upright AP view 1842 hours.  Stable lung volumes.  Stable cardiac size and mediastinal contours.  No pneumothorax, pulmonary edema, pleural effusion or confluent pulmonary opacity.  IMPRESSION: No acute cardiopulmonary abnormality.  Original Report Authenticated By: Harley Hallmark, M.D.     1. Chest pain     Date: 08/13/2011  Rate: 85  Rhythm: normal sinus rhythm  QRS Axis: normal  Intervals: normal  ST/T Wave abnormalities: nonspecific ST/T changes  Conduction Disutrbances:none  Narrative Interpretation: Improved from previous EKG.  Old EKG Reviewed: changes noted     MDM  Acute on chronic chest pain. Worse with exertion. No relief with nitroglycerin patch her trip. She's had this previously. Pain is improved with pain medicine. X-ray is reassuring. Cardiac enzymes are negative. She'll be admitted to telemetry bed.  I personally performed the services described in this documentation, which was scribed in my presence. The recorded information has been reviewed and considered. Juliet Rude. Rubin Payor, MD       Juliet Rude. Rubin Payor, MD 08/13/11 2210

## 2011-08-13 NOTE — ED Notes (Signed)
Ambulated in hall with Lucille Passy, CNA with 2L O2 [place]. sats ranged between 90%-96%, maintaining mainly at 92%. Tolerated well. Placed back in bed. O2 sats 96%. MD aware.

## 2011-08-13 NOTE — ED Notes (Signed)
Into room to see patient. Resting sitting up in bed. No respiratory distress. Pain 5\10. Denies nausea. Denies needs. Call bell within reach. Bed in low position and locked with side rails up. Will continue to monitor.

## 2011-08-13 NOTE — ED Notes (Signed)
Into room to see patient. Pain 3\10 at this time. Sats between 92-94% on 2L O2 [place]. Instructed to breathe in through nose and out through mouth. Holding ambulating patient at this time. MD aware.

## 2011-08-13 NOTE — ED Notes (Addendum)
MD at bedside to discuss plan of care

## 2011-08-13 NOTE — ED Notes (Addendum)
In no distress. Resting comfortably. Call bell within reach. Family at bedside.

## 2011-08-13 NOTE — ED Notes (Signed)
Report given to Melina Fiddler, RN. Ready for transport.

## 2011-08-13 NOTE — ED Notes (Signed)
Normal saline started at [facility] to push medications.

## 2011-08-13 NOTE — ED Notes (Signed)
Medicated as ordered per MD. Denies any needs at this time. Pain 7/10. Call bell within reach. Bed in low position and locked with side rails up. Family with patient.

## 2011-08-13 NOTE — ED Notes (Signed)
Pt c/o cramping chest pain in the middle of her chest since noon. Pt states that she is short of breath also but is on home oxygen. Denies nausea or diaphoresis. Pt states that she took 3 sl nitroglycerin and has a NTG patch on. Pt states that it was no relief.

## 2011-08-14 ENCOUNTER — Other Ambulatory Visit: Payer: Self-pay

## 2011-08-14 LAB — GLUCOSE, CAPILLARY
Glucose-Capillary: 112 mg/dL — ABNORMAL HIGH (ref 70–99)
Glucose-Capillary: 113 mg/dL — ABNORMAL HIGH (ref 70–99)

## 2011-08-14 LAB — CARDIAC PANEL(CRET KIN+CKTOT+MB+TROPI)
Relative Index: INVALID (ref 0.0–2.5)
Relative Index: INVALID (ref 0.0–2.5)
Total CK: 92 U/L (ref 7–177)
Troponin I: <0.3 ng/mL (ref <0.30)
Troponin I: <0.3 ng/mL (ref <0.30)
Troponin I: <0.3 ng/mL (ref <0.30)

## 2011-08-14 NOTE — Discharge Summary (Signed)
[name], [name]                ACCOUNT NO.:  [id number]  MEDICAL RECORD NO.:  [id number]  LOCATION:  A322                          FACILITY:  [facility]  PHYSICIAN:  Kingsley Callander. Ouida Sills, MD       DATE OF BIRTH:  01-07-1981  DATE OF ADMISSION:  08/13/2011 DATE OF DISCHARGE:  02/28/2013LH                              DISCHARGE SUMMARY   DISCHARGE DIAGNOSES: 1. Chest pain, myocardial infarction, ruled out. 2. Fibromyalgia. 3. Type 2 diabetes. 4. Morbid obesity. 5. Hypertension. 6. Sleep apnea. 7. Asthma. 8. Hyperlipidemia.  DISCHARGE MEDICATIONS: 1. Albuterol ProAir 2 puffs q.6h p.r.n. 2. Abilify 30 mg at bedtime. 3. Atorvastatin 80 mg daily. 4. Symbicort 164.5 two puffs b.i.d. 5. Premarin 0.625 mg daily. 6. Prozac 40 mg daily. 7. Neurontin 400 mg b.i.d. 8. Nasarel cream b.i.d. p.r.n. 9. Lisinopril 10 mg daily. 10.Metformin 500 mg at bedtime. 11.Metoprolol 50 mg b.i.d. 12.Nitro-Dur 0.4 mg/hour patch daily. 13.Sublingual nitroglycerin p.r.n. 14.Detrol LA 4 mg daily. 15.Prozac 40 mg daily.  HOSPITAL COURSE:  This patient is a 31 year old female who presented with chest pain.  Her EKGs revealed no evidence of acute ischemia.  She was hospitalized for cardiac monitoring and serial cardiac enzymes. Cardiac enzymes were normal with troponin I of less than 0.30.  Her chest x-ray revealed no acute infiltrate.  Her white count was 9.5 with a hemoglobin of 11.5.  She has previously had Cardiology evaluation and treatment by Dr. Dietrich Pates.  She had an echo in 2011, revealing normal left ventricular function with mild left ventricular hypertrophy.  She will continue therapy with beta-blocker, statin, aspirin 81 mg daily, and her nitroglycerin patch.  Her chest symptoms are felt to likely be related to her fibromyalgia.  Her condition was improved and stable for discharge on the evening of August 14, 2011.  She will be seen in followup in my office on August 25, 2011.  Her diabetes  control has been stable.  Her glucose on the afternoon of discharge is 113 on low-dose metformin.     Kingsley Callander. Ouida Sills, MD     ROF/MEDQ  D:  08/14/2011  T:  08/14/2011  Job:  [id number]

## 2011-08-14 NOTE — H&P (Signed)
NAMESUDA, [name]                ACCOUNT NO.:  [id number]  MEDICAL RECORD NO.:  [id number]  LOCATION:  A322                          FACILITY:  [facility]  PHYSICIAN:  Kingsley Callander. Ouida Sills, MD       DATE OF BIRTH:  1981/05/22  DATE OF ADMISSION:  08/13/2011 DATE OF DISCHARGE:  02/28/2013LH                             HISTORY & PHYSICAL   CHIEF COMPLAINT:  Chest pain.  HISTORY OF PRESENT ILLNESS:  This patient is a 31 year old African American female who presented to the emergency room with substernal chest pain, which began at approximately noon on the day of admission. She had had pain for several hours and continued to have it through her course in the emergency room.  She developed a radiation around her left side.  She had some shortness of breath and nausea, but no vomiting. She denied diaphoresis.  There was no relationship to exertion.  She took 3 sublingual nitroglycerin without relief.  She was evaluated in the emergency room and was subsequently placed in observation for further evaluation of her pain.  She has previously had cardiology evaluation and was not felt to have a significant likelihood of ischemic heart disease.  PAST MEDICAL HISTORY: 1. Type 2 diabetes. 2. Asthma. 3. Hypertension. 4. Morbid obesity. 5. Overactive bladder. 6. Fibromyalgia. 7. Lumbar disk disease. 8. Median neuropathy. 9. Hysterectomy. 10.Sleep apnea.  MEDICATIONS:  Metformin 500 mg b.i.d., metoprolol 50 mg b.i.d., lisinopril 10 mg daily, Premarin 0.625 mg daily, Lipitor 80 mg daily, acetaminophen/hydrocodone p.r.n., gabapentin 400 mg b.i.d., Abilify  20 mg daily, Prozac 20 mg daily, ProAir p.r.n., Symbicort 164.5 two puffs b.i.d.  ALLERGIES:  CODEINE and NORFLEX.  SOCIAL HISTORY:  Remarkable for previous smoking.  She does not drink or use drugs.  FAMILY HISTORY:  Hypertension.  REVIEW OF SYSTEMS:  Noncontributory.  She has not experienced cough, purulent sputum production, fever, chills,  vomiting, change in bowel habits, or difficulty voiding.  PHYSICAL EXAMINATION:  GENERAL:  Alert, obese, and in no distress. HEENT:  Eyes, nose, and oropharynx are unremarkable. NECK:  Reveals no JVD or thyromegaly. LUNGS:  Clear.  No wheezes. HEART:  Regular with no murmurs. CHEST:  Chest wall is nontender. ABDOMEN:  Nontender with no hepatosplenomegaly. EXTREMITIES:  Revealed no calf tenderness or swelling.  No clubbing or edema. NEUROLOGIC:  No focal weakness. LYMPH NODES:  No cervical or supraclavicular enlargement.  LABORATORY STUDIES:  Her EKG reveals normal sinus rhythm with no acute ischemic changes.  Initial troponin is negative.  Glucose is 164.  Chest x-ray reveals no acute infiltrate.  IMPRESSION/PLAN: 1. Chest pain.  She has been hospitalized for observation with serial     cardiac enzymes and electrocardiograms.  Continue beta-blocker,     aspirin, and statin. 2. Type 2 diabetes is well controlled.  Her last hemoglobin A1c was     6.1 in October. 3. Hypertension.  Continue metoprolol and lisinopril. 4. Hyperlipidemia. 5. Sleep apnea. 6. Morbid obesity. 7. Asthma.  Continue Symbicort. 8. Fibromyalgia.     Kingsley Callander. Ouida Sills, MD     ROF/MEDQ  D:  08/14/2011  T:  08/14/2011  Job:  [id number]

## 2011-08-14 NOTE — Plan of Care (Signed)
Problem: Phase I Progression Outcomes Goal: Other Phase I Outcomes/Goals Outcome: Completed/Met Date Met:  08/14/11 Pt IV removed from left arm cath tip intact Discharge instructions read to pt and her family  Pt verbalized understanding of all  Pt rates pChest pain at 5  Discharged to home

## 2011-08-15 ENCOUNTER — Other Ambulatory Visit: Payer: Self-pay

## 2011-08-15 DIAGNOSIS — R0602 Shortness of breath: Secondary | ICD-10-CM

## 2011-08-21 ENCOUNTER — Ambulatory Visit (HOSPITAL_COMMUNITY)
Admission: RE | Admit: 2011-08-21 | Discharge: 2011-08-21 | Disposition: A | Discharge status: Home / Self Care | Payer: Medicare Other | Source: Ambulatory Visit | Attending: Pulmonary Disease | Admitting: Pulmonary Disease

## 2011-08-21 DIAGNOSIS — R0602 Shortness of breath: Secondary | ICD-10-CM | POA: Insufficient documentation

## 2011-08-21 MED ORDER — ALBUTEROL SULFATE (5 MG/ML) 0.5% IN NEBU
2.5000 mg | INHALATION_SOLUTION | Freq: Once | RESPIRATORY_TRACT | Status: AC
  Administered 2011-08-21: 2.5 mg via RESPIRATORY_TRACT

## 2011-08-26 NOTE — Procedures (Signed)
[name], [name]                ACCOUNT NO.:  [id number]  MEDICAL RECORD NO.:  [id number]  LOCATION:                                 FACILITY:  PHYSICIAN:  Oluwatobiloba Martin L. Juanetta Gosling, M.D.DATE OF BIRTH:  August 03, 1980  DATE OF PROCEDURE: DATE OF DISCHARGE:                           PULMONARY FUNCTION TEST   Reason for pulmonary function testing is shortness of breath and COPD. 1. Spirometry shows a moderate ventilatory defect without definite     airflow obstruction. 2. Lung volumes show reduction in total lung capacity suggesting     restrictive change and some evidence of air trapping. 3. DLCO is minimally reduced. 4. Airway resistance is normal. 5. There is improvement with inhaled bronchodilator but it does not     reach the level of significance. 6. This is consistent with a combined obstructive and restrictive     change, part of which is related to body habitus.     [name] L. Juanetta Gosling, M.D.     ELH/MEDQ  D:  08/25/2011  T:  08/26/2011  Job:  [id number]  cc:   Kingsley Callander. Ouida Sills, MD Fax: [phone]

## 2011-08-28 ENCOUNTER — Ambulatory Visit (INDEPENDENT_AMBULATORY_CARE_PROVIDER_SITE_OTHER): Payer: Medicare Other | Admitting: Psychiatry

## 2011-08-28 ENCOUNTER — Encounter (HOSPITAL_COMMUNITY): Payer: Self-pay | Admitting: Psychiatry

## 2011-08-28 VITALS — Wt 367.0 lb

## 2011-08-28 DIAGNOSIS — F411 Generalized anxiety disorder: Secondary | ICD-10-CM

## 2011-08-28 DIAGNOSIS — F419 Anxiety disorder, unspecified: Secondary | ICD-10-CM

## 2011-08-28 DIAGNOSIS — F329 Major depressive disorder, single episode, unspecified: Secondary | ICD-10-CM

## 2011-08-28 MED ORDER — FLUOXETINE HCL 40 MG PO CAPS: 40.0000 mg | ORAL_CAPSULE | Freq: Every evening | ORAL | Status: DC

## 2011-08-28 MED ORDER — HYDROXYZINE PAMOATE 25 MG PO CAPS: 25.0000 mg | ORAL_CAPSULE | Freq: Three times a day (TID) | ORAL | Status: AC | PRN
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
## 2011-08-29 ENCOUNTER — Ambulatory Visit (INDEPENDENT_AMBULATORY_CARE_PROVIDER_SITE_OTHER): Payer: Medicare Other | Admitting: Psychiatry

## 2011-08-29 DIAGNOSIS — F331 Major depressive disorder, recurrent, moderate: Secondary | ICD-10-CM

## 2011-08-29 DIAGNOSIS — F419 Anxiety disorder, unspecified: Secondary | ICD-10-CM

## 2011-08-29 DIAGNOSIS — F411 Generalized anxiety disorder: Secondary | ICD-10-CM

## 2011-08-29 NOTE — Progress Notes (Signed)
Patient:  [name]   DOB: 13-Feb-1981  MR Number: [id number]  [facility]: [facility]:  [address] [place],  [place], [address]  Start: Friday 08/29/2011 10:05 AM End: Friday 08/29/2011 10:55 AM  Provider/Observer:     Florencia Reasons, MSW, LCSW   Chief Complaint:      Chief Complaint  Patient presents with  . Anxiety    Reason For Service:     The patient was referred for services by psychiatrist Dr. Lolly Mustache to address trauma history and improve coping skills. The patient has a long-standing history of symptoms of anxiety and depression beginning in adolescence with symptoms worsening in the past several years. Patient has multiple health issues and states being diagnosed with uterine cancer in March 2011 resulting in patient having a total hysterectomy in April 2011. Patient also reports a history of violent thoughts towards self and periods of rage and anger. Patient is seen for follow up appointment today as she is experiencing increased anxiety and depressed mood.  Interventions Strategy:  Supportive therapy, cognitive behavioral therapy  Participation Level:   Active  Participation Quality:  Appropriate      Behavioral Observation:  Well Groomed, Fatigued, and Appropriate.   Current Psychosocial Factors: The patient reports increased breathing difficulty and increased pressure from health care providers to have weight loss surgery.    Content of Session:   Reviewing symptoms, processing feelings, identifying relaxation and coping techniques.  Current Status:   Patient reports increased anxiety, increased depressed mood, increased sleep difficulty, social withdrawal,  and memory difficulty. She also reports fleeing suicidal ideations earlier this week. She denies current suicidal ideations and contracts for safety.  She agrees to call this practice, call 911, or have someone take her to the emergency room should symptoms worsen.  Patient Progress:   Poor. Patient  reports increased health issues related to increased breathing difficulty along with increased pain. She is experiencing increased sleep difficulty as she fears that she will die during the night. She has been having chest pain but medical tests have ruled out any heart conditions. She will receive the results of pulmonary function test in the near future. She expresses fear as well as frustration that her medical providers have recommended she have weight loss surgery. She states that she is afraid that she will only receive the results of weight loss but not the results of pain relief. Patient also expresses frustration when providers have suggested that she increase her physical activity as she thinks that she is being viewed as lazy and not wanting to walk. Patient states not being more active due to the chronic pain and the breathing problems. Patient states feeling trapped in her body. Patient reports having fleeting suicidal ideations this past Wednesday after a visit at her doctor's office where a nurse stated information about patient's weight in front of another patient. Patient felt disrespected and and embarrassed. Patient reports becoming more withdrawn and more isolated. She has resumed old coping mechanisms including buying sprees. Therapist works with patient to review relaxation techniques and identify ways to use her support system.  Target Goals:   1. Improve mood as evidenced by smiling more, resuming normal interest in activities, and decreasing emotional outbursts. 2. Improve ability to set and maintain boundaries as well as assertiveness skills. 3 improve coping skills.  Last Reviewed:   03/10/2011  Goals Addressed Today:    Improve coping skills  Impression/Diagnosis:   Patient presents with a long-standing history of anxiety,  depression, and mood swings along with periods of rage. She also has experienced social withdrawal, crying spells and negative thoughts. Patient also has  multiple health problems including degenerative disc disease and fibromyalgia. Diagnosis mood disorder NOS,  rule out major depressive disorder, rule out PTSD  Diagnosis:  Axis I:  1. Major depressive disorder, recurrent, moderate   2. Anxiety disorder             Axis II: Deferred

## 2011-08-29 NOTE — Patient Instructions (Signed)
Discussed orally 

## 2011-09-03 ENCOUNTER — Encounter: Payer: Medicare Other | Attending: Neurosurgery | Admitting: Physical Medicine and Rehabilitation

## 2011-09-03 ENCOUNTER — Encounter: Payer: Self-pay | Admitting: Physical Medicine and Rehabilitation

## 2011-09-03 VITALS — BP 149/78 | HR 78 | Resp 20 | Ht 62.0 in | Wt 364.2 lb

## 2011-09-03 DIAGNOSIS — M48061 Spinal stenosis, lumbar region without neurogenic claudication: Secondary | ICD-10-CM | POA: Insufficient documentation

## 2011-09-03 DIAGNOSIS — M545 Low back pain, unspecified: Secondary | ICD-10-CM | POA: Insufficient documentation

## 2011-09-03 DIAGNOSIS — M79609 Pain in unspecified limb: Secondary | ICD-10-CM | POA: Insufficient documentation

## 2011-09-03 DIAGNOSIS — M25569 Pain in unspecified knee: Secondary | ICD-10-CM

## 2011-09-03 DIAGNOSIS — M76899 Other specified enthesopathies of unspecified lower limb, excluding foot: Secondary | ICD-10-CM | POA: Insufficient documentation

## 2011-09-03 DIAGNOSIS — G8929 Other chronic pain: Secondary | ICD-10-CM | POA: Insufficient documentation

## 2011-09-03 DIAGNOSIS — R269 Unspecified abnormalities of gait and mobility: Secondary | ICD-10-CM | POA: Insufficient documentation

## 2011-09-03 DIAGNOSIS — M48062 Spinal stenosis, lumbar region with neurogenic claudication: Secondary | ICD-10-CM

## 2011-09-03 MED ORDER — GABAPENTIN 400 MG PO CAPS: 400.0000 mg | ORAL_CAPSULE | Freq: Three times a day (TID) | ORAL | Status: DC

## 2011-09-03 MED ORDER — HYDROCODONE-ACETAMINOPHEN 5-500 MG PO TABS: 1.0000 | ORAL_TABLET | Freq: Every day | ORAL | Status: DC | PRN

## 2011-09-03 NOTE — Patient Instructions (Signed)
Please return to clinic next month for refill in your pain medications and narcotic medication monitoring.  I've increased your gabapentin from twice a day to 3 times a day.  An encouraging to walk on a daily basis on an even surface with your cane.

## 2011-09-03 NOTE — Progress Notes (Signed)
Subjective:    Patient ID: [name], female    DOB: Dec 02, 1980, 31 y.o.   MRN: [id number]  HPI  The patient is a 31 year old woman who [facility] pain complaints are related to her low back in her lower extremities.  Pain in her lower extremities when she wants more than 2 minutes. She underwent lumbar epidural steroid injection 07/17/2011. She reports approximately 2 weeks of relief after the injection. Her ability to ambulate improved up to 5-10 minutes.  She reports some recent itching but I'm not sure it's really from the pain medication she's been on it for quite a while.  She was recently hospitalized for chest pain. Her psychiatrist also recently put her on some new medicine for anxiety HYDROXYZINE.  She is here today for a refill of her pain medication.  Pain Inventory Average Pain 7 Pain Right Now 9 My pain is constant, sharp, burning, stabbing and aching  In the last 24 hours, has pain interfered with the following? General activity 10 Relation with others 10 Enjoyment of life 10 What TIME of day is your pain at its worst? at all times Sleep (in general) Fair  Pain is worse with: walking, bending, sitting and standing Pain improves with: medication Relief from Meds: 4  Mobility use a cane use a walker how many minutes can you walk? 2 ability to climb steps?  yes do you drive?  no Do you have any goals in this area?  no  Function disabled: date disabled 2010 I need assistance with the following:  dressing, bathing, toileting, meal prep, household duties and shopping Do you have any goals in this area?  no  Neuro/Psych depression anxiety  Prior Studies Any changes since last visit?  no  Physicians involved in your care Any changes since last visit?  no Primary care Dr [name] Dr [name] psychiatrist, Dr [name]  Pulmonologist,      Review of Systems  Musculoskeletal: Positive for back pain.    Spasms  Psychiatric/Behavioral: Positive for dysphoric mood. The patient is nervous/anxious.   All other systems reviewed and are negative.       Objective:   Physical Exam  Patient is alert oriented cooperative pleasant follows commands without difficulty answers questions appropriately  Cranial nerves and coordination are intact reflexes are diminished below the knees decreased sensation as noted below the knees especially over the dorsum of the right foot.  Motor strength is good and the lower extremities bilaterally 5 over 5 and hip flexors and extensors dorsiflexors plantar flexors.  Straight leg raise is negative  Transition slowly from sitting to standing. Gait is slightly wide based with short stride length. She uses a cane for ambulation. Mild Balance disorder.  Full range of motion is noted at the knees. Effusion is not appreciated. Left medial joint line tenderness is noted. Medial and lateral joint line tenderness is noted on right knee. There's no crepitus noted today with flexion and extension.  Lumbar motion is limited in all planes.     Assessment & Plan:  1. L4-5 significant spinal stenosis, 8 mm at this level; also stenosis  at L5-S1.  2. Mild gait disorder.  3. History of trochanteric bursitis, stable.  4. Morbid obesity.  5. Bilateral knee pain consistent with osteoarthritis.  The Opioid medication pill count was appropriate.  No reported significant side effects of Opioid medications noted.  No aberrant behavior noted.  Patient cautioned regarding operation of  machinery or vehicles.  Patient understands risk and benefits of these medications.  There is no indication or report of risk to self or others.    Epidural steroid injections did not provide release more than 2 weeks and we will not continue to repeat them.  I continue to encourage her to walk on a daily basis and even better get involved in a arthritis pool program.  Continue use of gabapentin as  well as Vicodin. She uses 1 Vicodin per day which she uses at bedtime.

## 2011-09-11 ENCOUNTER — Encounter (HOSPITAL_COMMUNITY): Payer: Self-pay | Admitting: Psychiatry

## 2011-09-11 ENCOUNTER — Ambulatory Visit (INDEPENDENT_AMBULATORY_CARE_PROVIDER_SITE_OTHER): Payer: Medicare Other | Admitting: Psychiatry

## 2011-09-11 DIAGNOSIS — F331 Major depressive disorder, recurrent, moderate: Secondary | ICD-10-CM

## 2011-09-11 NOTE — Patient Instructions (Signed)
Discussed orally 

## 2011-09-11 NOTE — Progress Notes (Signed)
Patient:  [name]   DOB: 08-31-1980  MR Number: [id number]  Location: [facility]:  [address] [place],  [place], [address]  Start: Thursday 09/11/2011 10:05 AM End: Thursday 09/11/2011 10:35 AM  Provider/Observer:     Florencia Reasons, MSW, LCSW   Chief Complaint:      Chief Complaint  Patient presents with  . Depression    Reason For Service:     The patient was referred for services by psychiatrist Dr. Lolly Mustache to address trauma history and improve coping skills. The patient has a long-standing history of symptoms of anxiety and depression beginning in adolescence with symptoms worsening in the past several years. Patient has multiple health issues and states being diagnosed with uterine cancer in March 2011 resulting in patient having a total hysterectomy in April 2011. Patient also reports a history of violent thoughts towards self and periods of rage and anger. Patient is seen for follow up appointment today.  Interventions Strategy:  Supportive therapy, cognitive behavioral therapy  Participation Level:   Active  Participation Quality:  Appropriate      Behavioral Observation:  Well Groomed, Fatigued, and Appropriate.   Current Psychosocial Factors: The patient reports recently being informed and instructed to use oxygen 24/7.  Content of Session:   Reviewing symptoms, processing feelings, identifying relaxation and coping techniques.  Current Status:   Patient reports improved mood, decreased anxiety, and decreased worry along with improved sleep pattern.  She reports increased irritability.  Patient Progress:   Good. Patient reports recent tests at her doctor's office indicate she needs to have an oxygen tank 24/7. Patient has tank with her today in session. She reports feeling less tired since using oxygen. She also reports feeling better since beginning to take prednisone as she is experiencing less pain. Patient has started to resume normal interest in  activities and attended church this past Sunday. She also has been talking to her friends on the phone.  Patient also expresses less fear regarding going to sleep.  She reports the increased dosage of abilify along with the anti anxiety medication has helped.  She also reports using her relaxation breathing along with identifying and challenging negative thoughts have decreased anxiety.  Patient continues to have strong support from parents.   Target Goals:   1. Improve mood as evidenced by smiling more, resuming normal interest in activities, and decreasing emotional outbursts. 2. Improve ability to set and maintain boundaries as well as assertiveness skills. 3 improve coping skills.  Last Reviewed:   03/10/2011  Goals Addressed Today:    Improve coping skills, improve mood  Impression/Diagnosis:   Patient presents with a long-standing history of anxiety, depression, and mood swings along with periods of rage. She also has experienced social withdrawal, crying spells and negative thoughts. Patient also has multiple health problems including degenerative disc disease and fibromyalgia. Diagnosis mood disorder NOS,  rule out major depressive disorder, rule out PTSD  Diagnosis:  Axis I:  1. Major depressive disorder, recurrent, moderate             Axis II: Deferred

## 2011-09-22 ENCOUNTER — Other Ambulatory Visit (HOSPITAL_COMMUNITY): Payer: Self-pay | Admitting: Psychiatry

## 2011-09-22 DIAGNOSIS — F329 Major depressive disorder, single episode, unspecified: Secondary | ICD-10-CM

## 2011-09-30 ENCOUNTER — Ambulatory Visit (INDEPENDENT_AMBULATORY_CARE_PROVIDER_SITE_OTHER): Payer: Medicare Other | Admitting: Psychiatry

## 2011-09-30 ENCOUNTER — Encounter (HOSPITAL_COMMUNITY): Payer: Self-pay | Admitting: Psychiatry

## 2011-09-30 VITALS — Wt 365.0 lb

## 2011-09-30 DIAGNOSIS — F4310 Post-traumatic stress disorder, unspecified: Secondary | ICD-10-CM

## 2011-09-30 DIAGNOSIS — F329 Major depressive disorder, single episode, unspecified: Secondary | ICD-10-CM

## 2011-09-30 DIAGNOSIS — F323 Major depressive disorder, single episode, severe with psychotic features: Secondary | ICD-10-CM

## 2011-09-30 MED ORDER — ARIPIPRAZOLE 30 MG PO TABS: 30.0000 mg | ORAL_TABLET | Freq: Every day | ORAL | Status: DC

## 2011-09-30 MED ORDER — FLUOXETINE HCL 40 MG PO CAPS: 40.0000 mg | ORAL_CAPSULE | Freq: Every evening | ORAL | Status: DC

## 2011-09-30 MED ORDER — HYDROXYZINE PAMOATE 25 MG PO CAPS: 25.0000 mg | ORAL_CAPSULE | Freq: Three times a day (TID) | ORAL | Status: DC | PRN
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
## 2011-10-06 ENCOUNTER — Encounter: Payer: Self-pay | Admitting: Physical Medicine and Rehabilitation

## 2011-10-06 ENCOUNTER — Encounter: Payer: Medicare Other | Attending: Neurosurgery | Admitting: Physical Medicine and Rehabilitation

## 2011-10-06 DIAGNOSIS — M48061 Spinal stenosis, lumbar region without neurogenic claudication: Secondary | ICD-10-CM

## 2011-10-06 DIAGNOSIS — R269 Unspecified abnormalities of gait and mobility: Secondary | ICD-10-CM

## 2011-10-06 DIAGNOSIS — M25569 Pain in unspecified knee: Secondary | ICD-10-CM

## 2011-10-06 DIAGNOSIS — G8929 Other chronic pain: Secondary | ICD-10-CM

## 2011-10-06 DIAGNOSIS — R2689 Other abnormalities of gait and mobility: Secondary | ICD-10-CM | POA: Insufficient documentation

## 2011-10-06 MED ORDER — HYDROCODONE-ACETAMINOPHEN 5-500 MG PO TABS: 1.0000 | ORAL_TABLET | Freq: Every day | ORAL | Status: DC | PRN

## 2011-10-06 MED ORDER — GABAPENTIN 400 MG PO CAPS: 400.0000 mg | ORAL_CAPSULE | Freq: Three times a day (TID) | ORAL | Status: DC

## 2011-10-06 NOTE — Patient Instructions (Addendum)
I continue to encourage her to walk on a daily basis and even better get involved in a arthritis pool program.  Continue use of gabapentin as well as Vicodin.   She uses 1 Vicodin per day which she uses at bedtime.  Please make sure you keep your pain medications locked up in a place.

## 2011-10-06 NOTE — Progress Notes (Signed)
Subjective:    Patient ID: [name], female    DOB: 07-12-1980, 31 y.o.   MRN: [id number]  HPI  The patient is a 31 year old woman who [facility] pain complaints are related to her low back in her lower extremities.  Pain in her lower extremities when she wants more than 2 minutes. She underwent lumbar epidural steroid injection 07/17/2011. She reports approximately 2 weeks of relief after the injection. Her ability to ambulate improved up to 5-10 minutes.    She is here today for a refill of her pain medication.    She can walk about 5 minutes. This is twice as far as she was able to walk last month. She was started on oxygen about 4 weeks ago. Patient reports that she is going out more.  She has gone to church.  She has been disabled since 2010  She states she thinks about suicide occasionally but would seek help before acting.   Pain Inventory Average Pain 6 Pain Right Now 9 My pain is constant, sharp, burning, stabbing and aching  In the last 24 hours, has pain interfered with the following? General activity 9 Relation with others 9 Enjoyment of life 9 What TIME of day is your pain at its worst? All Day Sleep (in general) Fair  Pain is worse with: walking, bending, sitting and standing Pain improves with: heat/ice, medication and injections Relief from Meds: 5  Mobility use a cane use a walker how many minutes can you walk? 5 ability to climb steps?  yes do you drive?  no use a wheelchair transfers alone  Function disabled: date disabled  I need assistance with the following:  dressing, bathing, toileting, meal prep, household duties and shopping  Neuro/Psych spasms depression anxiety  Prior Studies Any changes since last visit?  no  Physicians involved in your care Any changes since last visit?  no  Review of Systems  Constitutional: Negative.   HENT: Negative.   Eyes: Negative.   Respiratory:  Positive for cough, shortness of breath and wheezing.   Cardiovascular: Negative.   Gastrointestinal: Negative.   Genitourinary: Negative.   Musculoskeletal: Negative.   Skin: Negative.   Neurological: Negative.   Hematological: Negative.   Psychiatric/Behavioral: Negative.        Objective:   Physical Exam  Patient with 02 tank today  Oxygen per nasal canula.  Patient is alert oriented cooperative pleasant follows commands without difficulty answers questions appropriately  Cranial nerves and coordination are intact reflexes are diminished below the knees decreased sensation as noted below the knees especially over the dorsum of the right foot.  Motor strength is good and the lower extremities bilaterally 5 over 5 and hip flexors and extensors dorsiflexors plantar flexors.  Straight leg raise is negative  Transition slowly from sitting to standing. Gait is slightly wide based with short stride length. She uses a cane for ambulation. Mild Balance disorder.  Full range of motion is noted at the knees. Effusion is not appreciated. Left medial joint line tenderness is noted. Medial and lateral joint line tenderness is noted on right knee. There's no crepitus noted today with flexion and extension.  Lumbar motion is limited in all planes.       Assessment & Plan:  1. L4-5 significant spinal stenosis, 8 mm at this level; also stenosis  at L5-S1.  2. Mild gait disorder. Multifactorial.  Lumbar stenosis mama knee osteoarthritis, obesity, requiring O2  Patient reports she is able to walk further now that she is on O2,  last month she could walk about 2 minutes this month she can go 5 minutes.   3. History of trochanteric bursitis, stable.   4. Morbid obesity.   5. Bilateral knee pain consistent with osteoarthritis.    The Opioid medication pill count was appropriate. No reported significant side effects of Opioid medications noted. No aberrant behavior noted. Patient cautioned  regarding operation of machinery or vehicles. Patient understands risk and benefits of these medications. There is no indication or report of risk to self or others.  Epidural steroid injections did not provide release more than 2 weeks and we will not continue to repeat them.  I continue to encourage her to walk on a daily basis and even better get involved in a arthritis pool program.  Continue use of gabapentin as well as Vicodin. She uses 1 Vicodin per day which she uses at bedtime.   Urine drug screen today.  Followup with nurse in one month for refill Follow up with Asyah Candler in 2 months for refill

## 2011-10-06 NOTE — Progress Notes (Signed)
Addended by: Doreene Eland on: 10/06/2011 01:19 PM   Modules accepted: Orders

## 2011-10-07 LAB — PULMONARY FUNCTION TEST

## 2011-10-09 ENCOUNTER — Ambulatory Visit (INDEPENDENT_AMBULATORY_CARE_PROVIDER_SITE_OTHER): Payer: Medicare Other | Admitting: Psychiatry

## 2011-10-09 DIAGNOSIS — F331 Major depressive disorder, recurrent, moderate: Secondary | ICD-10-CM

## 2011-10-09 NOTE — Patient Instructions (Signed)
Discussed orally 

## 2011-10-09 NOTE — Progress Notes (Signed)
Patient:  [name]   DOB: 15-Jul-1980  MR Number: [id number]  Location: [facility]:  [address] [place],  [place], [address]  Start: Thursday  10/09/2011 9:20 AM End: Thursday  10/09/2011 9:45 AM  Provider/Observer:     Florencia Reasons, MSW, LCSW   Chief Complaint:      Chief Complaint  Patient presents with  . Depression    Reason For Service:     The patient was referred for services by psychiatrist Dr. Lolly Mustache to address trauma history and improve coping skills. The patient has a long-standing history of symptoms of anxiety and depression beginning in adolescence with symptoms worsening in the past several years. Patient has multiple health issues and states being diagnosed with uterine cancer in March 2011 resulting in patient having a total hysterectomy in April 2011. Patient also reports a history of violent thoughts towards self and periods of rage and anger. Patient is seen for follow up appointment today.  Interventions Strategy:  Supportive therapy, cognitive behavioral therapy  Participation Level:   Active  Participation Quality:  Appropriate      Behavioral Observation:  Well Groomed, Fatigued, and Appropriate.   Current Psychosocial Factors: The patient reports her friend died in her sleep of a massive heart attack this week.  Content of Session:   Reviewing symptoms, processing feelings, identifying and challenging cognitive distortions, identifying relaxation and coping techniques.  Current Status:   Patient reports being less depressed but  increased anxiety along with increased sleep difficulty.  Patient Progress:   Good. Patient reports doing well until 2 weeks ago when she caught a cold.  She's had increased breathing difficulty and sleep difficulty since that time. Patient began to resume fears that something would happen to her in her sleep.  These fears were intensified when she learned of the recent death of her friend. Patient expresses  appropriate sadness about friend's death.  Patient reports staying at home most of the time but continued attendance at church.    Target Goals:   1. Improve mood as evidenced by smiling more, resuming normal interest in activities, and decreasing emotional outbursts. 2. Improve ability to set and maintain boundaries as well as assertiveness skills. 3 improve coping skills.  Last Reviewed:   03/10/2011  Goals Addressed Today:    Improve coping skills, decrease anxiety  Impression/Diagnosis:   Patient presents with a long-standing history of anxiety, depression, and mood swings along with periods of rage. She also has experienced social withdrawal, crying spells and negative thoughts. Patient also has multiple health problems including degenerative disc disease and fibromyalgia. Diagnosis mood disorder NOS,  rule out major depressive disorder, rule out PTSD  Diagnosis:  Axis I:  1. Major depressive disorder, recurrent, moderate             Axis II: Deferred

## 2011-10-31 ENCOUNTER — Ambulatory Visit (INDEPENDENT_AMBULATORY_CARE_PROVIDER_SITE_OTHER): Payer: Medicare Other | Admitting: Psychiatry

## 2011-10-31 DIAGNOSIS — F331 Major depressive disorder, recurrent, moderate: Secondary | ICD-10-CM

## 2011-10-31 NOTE — Progress Notes (Signed)
Patient:  [name]   DOB: [birth year]  MR Number: [id number]  Location: [facility]:  [address] [place],  [place], [address]  Start: Friday 10/31/2011 9:10 AM End: Friday 10/31/2011 9:50 AM  Provider/Observer:     Florencia Reasons, MSW, LCSW   Chief Complaint:      Chief Complaint  Patient presents with  . Depression    Reason For Service:     The patient was referred for services by psychiatrist Dr. Lolly Mustache to address trauma history and improve coping skills. The patient has a long-standing history of symptoms of anxiety and depression beginning in adolescence with symptoms worsening in the past several years. Patient has multiple health issues and states being diagnosed with uterine cancer in March 2011 resulting in patient having a total hysterectomy in April 2011. Patient also reports a history of violent thoughts towards self and periods of rage and anger. Patient is seen for follow up appointment today.  Interventions Strategy:  Supportive therapy, cognitive behavioral therapy  Participation Level:   Active  Participation Quality:  Appropriate      Behavioral Observation:  Well Groomed, Fatigued, and depressed  Current Psychosocial Factors: The patient reports increased tension between her parents.  Content of Session:   Reviewing symptoms, processing feelings, identifying ways to set and maintain boundaries, identifying ways to express concerns to her parents, and identifying relaxation and coping techniques.  Current Status:   Patient reports increased depressed mood, anxiety, and worry. Patient also reports suicidal ideations this past Monday but being able to control. She contracts for safety and states she will not harm self. Patient agrees to call this practice, call 911, or have someone take her to the emergency room should symptoms worsen.  Patient Progress:   Fair. Patient reports increased stress related to increased tension in the relationship between  her parents. Patient states feeling like a pawn in their relationship as father tends to be extremely accommodating to patient and gives no accommodation to mother in an effort to hurt mother. She also reports that mother tends to dump on her regarding the way mother is treated by father. Patient expresses frustration, sadness, and anger. Therapist works with patient to identify ways to communicate her concerns to her parents as well as identify and  review coping and relaxation techniques.   Target Goals:   1. Improve mood as evidenced by smiling more, resuming normal interest in activities, and decreasing emotional outbursts. 2. Improve ability to set and maintain boundaries as well as assertiveness skills. 3 improve coping skills.  Last Reviewed:   03/10/2011  Goals Addressed Today:    Improve coping skills, decrease anxiety, improve assertiveness skills and ways to set and maintain boundaries  Impression/Diagnosis:   Patient presents with a long-standing history of anxiety, depression, and mood swings along with periods of rage. She also has experienced social withdrawal, crying spells and negative thoughts. Patient also has multiple health problems including degenerative disc disease and fibromyalgia. Diagnosis mood disorder NOS,  rule out major depressive disorder, rule out PTSD  Diagnosis:  Axis I:  1. Major depressive disorder, recurrent, moderate             Axis II: Deferred

## 2011-10-31 NOTE — Patient Instructions (Signed)
Discussed orally 

## 2011-11-03 ENCOUNTER — Encounter: Payer: Self-pay | Admitting: Physical Medicine and Rehabilitation

## 2011-11-03 ENCOUNTER — Encounter: Payer: Medicare Other | Attending: Neurosurgery | Admitting: Physical Medicine and Rehabilitation

## 2011-11-03 VITALS — BP 141/72 | HR 86 | Resp 18 | Ht 62.0 in | Wt 381.0 lb

## 2011-11-03 DIAGNOSIS — G8929 Other chronic pain: Secondary | ICD-10-CM

## 2011-11-03 DIAGNOSIS — M48061 Spinal stenosis, lumbar region without neurogenic claudication: Secondary | ICD-10-CM | POA: Insufficient documentation

## 2011-11-03 DIAGNOSIS — R269 Unspecified abnormalities of gait and mobility: Secondary | ICD-10-CM | POA: Insufficient documentation

## 2011-11-03 DIAGNOSIS — M25569 Pain in unspecified knee: Secondary | ICD-10-CM | POA: Insufficient documentation

## 2011-11-03 DIAGNOSIS — R2689 Other abnormalities of gait and mobility: Secondary | ICD-10-CM

## 2011-11-03 MED ORDER — HYDROCODONE-ACETAMINOPHEN 5-500 MG PO TABS: 1.0000 | ORAL_TABLET | Freq: Every day | ORAL | Status: DC | PRN

## 2011-11-03 MED ORDER — GABAPENTIN 300 MG PO CAPS: 300.0000 mg | ORAL_CAPSULE | Freq: Four times a day (QID) | ORAL | Status: DC

## 2011-11-03 NOTE — Progress Notes (Signed)
Subjective:    Patient ID: [name], female    DOB: 1980-09-25, 31 y.o.   MRN: [id number]  HPI The patient is a 31 year old woman who [facility] chronic pain complaints are related to her low back in her lower extremities.  Pain in her lower extremities when she wants more than 2 minutes. She underwent lumbar epidural steroid injection 07/17/2011. She reports approximately 2 weeks of relief after the injection. Her ability to ambulate improved up to 5-10 minutes.    She is here today for a refill of her pain medication.   She tells me that she used to be on  Lyrica a little over a year ago and things may be of help her a little more than to gabapentin. She did not have any adverse reactions while she was on it.  She would like to retrial Lyrica.     Pain Inventory Average Pain 7 Pain Right Now 8 My pain is constant, sharp, stabbing and aching  In the last 24 hours, has pain interfered with the following? General activity 7 Relation with others 7 Enjoyment of life 7 What TIME of day is your pain at its worst? all the time Sleep (in general) Fair  Pain is worse with: walking, bending, sitting and standing Pain improves with: medication and injections Relief from Meds: 5  Mobility walk with assistance use a cane use a walker how many minutes can you walk? 5 ability to climb steps?  yes do you drive?  no use a wheelchair transfers alone Do you have any goals in this area?  no  Function disabled: date disabled  I need assistance with the following:  bathing, toileting, meal prep, household duties and shopping Do you have any goals in this area?  yes  Neuro/Psych No problems in this area  Prior Studies Any changes since last visit?  no  Physicians involved in your care Any changes since last visit?  no        Review of Systems  All other systems reviewed and are negative.       Objective:   Physical  Exam  Patient is alert oriented cooperative pleasant follows commands without difficulty answers questions appropriately  Cranial nerves and coordination are intact   Reflexes are diminished below the knees  Decreased sensation as noted below the knees especially over the dorsum of the right foot.   Motor strength is good and the lower extremities bilaterally 5 over 5 and hip flexors and extensors dorsiflexors plantar flexors.   Straight leg raise is negative   Transition slowly from sitting to standing.   Gait is slightly wide based with short stride length. She uses a cane for ambulation. Difficulty with tandem gait.  Full range of motion is noted at the knees. Effusion is not appreciated. Left medial joint line tenderness is noted. Medial and lateral joint line tenderness is noted on right knee. There's no crepitus noted today with flexion and extension at the knees.   Lumbar motion is limited in all planes.        Assessment & Plan:  1. L4-5 significant spinal stenosis, 8 mm at this level; also stenosis  at L5-S1.    2. Mild gait disorder.   3. History of trochanteric bursitis, stable.   4. Morbid obesity.   5. Bilateral knee pain consistent with osteoarthritis.   The Opioid medication pill count was appropriate. No reported significant side effects of Opioid  medications noted. No aberrant behavior noted. Patient cautioned regarding operation of machinery or vehicles. Patient understands risk and benefits of these medications. There is no indication or report of risk to self or others.   Epidural steroid injections did not provide release more than 2 weeks and we will not continue to repeat them.   I continue to encourage her to walk on a daily basis and even better get involved in a arthritis pool program.  Emphasis on staying active.  Continue use of gabapentin as well as Vicodin.   The patient would like to wean down off of gabapentin over the next month and  consider a trial of Lyrica.   We have discussed how to wean down off of the gabapentin she verbalizes understanding. She uses 1 Vicodin per day which she uses at bedtime.   Followup Miliana Gangwer next month

## 2011-11-03 NOTE — Patient Instructions (Signed)
Followup with [name] How next month  I have changed your gabapentin to 300 mg 4 times a day.  At the next visit we will consider starting you on Lyrica  We have also discussed a tapering schedule where you would drop 1 gabapentin capsule every 3 days.

## 2011-11-14 ENCOUNTER — Ambulatory Visit (INDEPENDENT_AMBULATORY_CARE_PROVIDER_SITE_OTHER): Payer: Medicare Other | Admitting: Psychiatry

## 2011-11-14 DIAGNOSIS — F331 Major depressive disorder, recurrent, moderate: Secondary | ICD-10-CM

## 2011-11-14 NOTE — Patient Instructions (Signed)
Discussed orally 

## 2011-11-14 NOTE — Progress Notes (Signed)
Patient:  [name]   DOB: [birth year]  MR Number: [id number]  [facility]: [facility]:  [address] [place],  [place], [address]  Start: Friday 11/14/2011 1:00 PM End: Friday 11/14/2011 1:45 pM  Provider/Observer:     Florencia Reasons, MSW, LCSW   Chief Complaint:      Chief Complaint  Patient presents with  . Depression    Reason For Service:     The patient was referred for services by psychiatrist Dr. Lolly Mustache to address trauma history and improve coping skills. The patient has a long-standing history of symptoms of anxiety and depression beginning in adolescence with symptoms worsening in the past several years. Patient has multiple health issues and states being diagnosed with uterine cancer in March 2011 resulting in patient having a total hysterectomy in April 2011. Patient also reports a history of violent thoughts towards self and periods of rage and anger. Patient is seen for follow up appointment today.  Interventions Strategy:  Supportive therapy, cognitive behavioral therapy  Participation Level:   Active  Participation Quality:  Appropriate      Behavioral Observation:  Well Groomed, appropriate, alert  Current Psychosocial Factors: The patient reports continued tension between her parents.  Content of Session:   Reviewing symptoms, processing feelings, identifying patient's efforts to set and maintain boundaries, identifying ways to communicate her needs to her father, developing a pain chart and activities patient can use to manage chronic pain.  Current Status:   Patient reports improved mood, decreased anxiety, and decrease worry.   Patient Progress:   Good. Patient reports improved mood and increased social involvement since last session. Patient states she has been skyping a friend. She is pleased with this as she is overcoming her reluctance to be seen on camera. She also reports helping her friend with issues that patient also experiences has been  helpful to patient. She reports feeling productive and helpful. Patient continues to experience chronic pain but is hopeful that an upcoming transition from Neurontin to Lyrica will be helpful. Therapist works with patient to develop a pain chart and activities patient can use to manage chronic pain. Although significant tension remains in her parents' relationship, patient reports decreased stress as she has made successful efforts in setting and maintaining boundaries with her parents. She expresses some frustration regarding her father as he tends to be overly helpful to patient when she really wants to try to perform certain tasks independently. Therapist works with patient to identify ways to communicate her needs to her father.  Target Goals:   1. Improve mood as evidenced by smiling more, resuming normal interest in activities, and decreasing emotional outbursts. 2. Improve ability to set and maintain boundaries as well as assertiveness skills. 3 improve coping skills.  Last Reviewed:   03/10/2011  Goals Addressed Today:    Improve coping skills, decrease anxiety, improve assertiveness skills and ways to set and maintain boundaries  Impression/Diagnosis:   Patient presents with a long-standing history of anxiety, depression, and mood swings along with periods of rage. She also has experienced social withdrawal, crying spells and negative thoughts. Patient also has multiple health problems including degenerative disc disease and fibromyalgia. Diagnosis mood disorder NOS,  rule out major depressive disorder, rule out PTSD  Diagnosis:  Axis I:  1. Major depressive disorder, recurrent, moderate             Axis II: Deferred

## 2011-11-25 ENCOUNTER — Other Ambulatory Visit (HOSPITAL_COMMUNITY): Payer: Self-pay | Admitting: Psychiatry

## 2011-11-25 DIAGNOSIS — F329 Major depressive disorder, single episode, unspecified: Secondary | ICD-10-CM

## 2011-12-02 ENCOUNTER — Ambulatory Visit (INDEPENDENT_AMBULATORY_CARE_PROVIDER_SITE_OTHER): Payer: Medicare Other | Admitting: Psychiatry

## 2011-12-02 ENCOUNTER — Encounter (HOSPITAL_COMMUNITY): Payer: Self-pay | Admitting: Psychiatry

## 2011-12-02 VITALS — Wt 364.0 lb

## 2011-12-02 DIAGNOSIS — F329 Major depressive disorder, single episode, unspecified: Secondary | ICD-10-CM

## 2011-12-02 DIAGNOSIS — F333 Major depressive disorder, recurrent, severe with psychotic symptoms: Secondary | ICD-10-CM

## 2011-12-02 DIAGNOSIS — F4310 Post-traumatic stress disorder, unspecified: Secondary | ICD-10-CM

## 2011-12-02 MED ORDER — ARIPIPRAZOLE 30 MG PO TABS: 30.0000 mg | ORAL_TABLET | Freq: Every day | ORAL | Status: DC

## 2011-12-02 MED ORDER — FLUOXETINE HCL 40 MG PO CAPS: 40.0000 mg | ORAL_CAPSULE | Freq: Every evening | ORAL | Status: DC
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
## 2011-12-03 ENCOUNTER — Encounter: Payer: Medicare Other | Attending: Neurosurgery | Admitting: Physical Medicine and Rehabilitation

## 2011-12-03 ENCOUNTER — Encounter: Payer: Self-pay | Admitting: Physical Medicine and Rehabilitation

## 2011-12-03 VITALS — BP 138/91 | HR 70 | Resp 18 | Ht 62.0 in | Wt 361.6 lb

## 2011-12-03 DIAGNOSIS — M542 Cervicalgia: Secondary | ICD-10-CM

## 2011-12-03 DIAGNOSIS — G8929 Other chronic pain: Secondary | ICD-10-CM

## 2011-12-03 DIAGNOSIS — M25569 Pain in unspecified knee: Secondary | ICD-10-CM | POA: Insufficient documentation

## 2011-12-03 DIAGNOSIS — R269 Unspecified abnormalities of gait and mobility: Secondary | ICD-10-CM | POA: Insufficient documentation

## 2011-12-03 DIAGNOSIS — IMO0001 Reserved for inherently not codable concepts without codable children: Secondary | ICD-10-CM

## 2011-12-03 DIAGNOSIS — M48061 Spinal stenosis, lumbar region without neurogenic claudication: Secondary | ICD-10-CM | POA: Insufficient documentation

## 2011-12-03 DIAGNOSIS — R2689 Other abnormalities of gait and mobility: Secondary | ICD-10-CM

## 2011-12-03 MED ORDER — PREGABALIN 25 MG PO CAPS: 25.0000 mg | ORAL_CAPSULE | Freq: Two times a day (BID) | ORAL | Status: DC

## 2011-12-03 MED ORDER — HYDROCODONE-ACETAMINOPHEN 5-500 MG PO TABS: 1.0000 | ORAL_TABLET | Freq: Every day | ORAL | Status: DC | PRN

## 2011-12-03 NOTE — Progress Notes (Signed)
Subjective:    Patient ID: [name], female    DOB: February 23, 1981, 31 y.o.   MRN: [id number]  HPI  The patient is a 31 year old woman who is here at her [facility] medicine for chronic pain complaints are related to her low back in her lower extremities.  Pain in her lower extremities when she wants more than 2 minutes. She underwent lumbar epidural steroid injection 07/17/2011. She reports approximately 2 weeks of relief after the injection. Her ability to ambulate improved up to 5-10 minutes.    She is here today for a refill of her pain medication.    She tells me that she used to be on Lyrica a little over a year ago and things may be of help her a little more than to gabapentin. She did not have any adverse reactions while she was on it.   She would like to retrial Lyrica.      Pain Inventory Average Pain 7 Pain Right Now 9 My pain is constant, burning and aching  In the last 24 hours, has pain interfered with the following? General activity 7 Relation with others 7 Enjoyment of life 7 What TIME of day is your pain at its worst? constant Sleep (in general) Fair  Pain is worse with: walking, bending, sitting and standing Pain improves with: medication and injections Relief from Meds: 4  Mobility use a cane use a walker how many minutes can you walk? 5 ability to climb steps?  yes do you drive?  no transfers alone  Function disabled: date disabled  I need assistance with the following:  feeding, dressing, bathing, toileting, meal prep, household duties and shopping Do you have any goals in this area?  no  Neuro/Psych bowel control problems  Prior Studies Any changes since last visit?  no  Physicians involved in your care Any changes since last visit?  no   Family History  Problem Relation Age of Onset  . Bipolar disorder Brother   . Other Brother     PTSD  . Bipolar disorder Maternal Aunt    History   Social History  .  Marital Status: Single    Spouse Name: N/A    Number of Children: N/A  . Years of Education: N/A   Social History Main Topics  . Smoking status: Former Smoker -- 0.2 packs/day    Quit date: 01/02/2011  . Smokeless tobacco: Never Used  . Alcohol Use: No  . Drug Use: No  . Sexually Active: No   Other Topics Concern  . None   Social History Narrative  . None   Past Surgical History  Procedure Date  . Abdominal hysterectomy   . Mouth surgery     Wisdom tooth extracted  . Dnc   . Dilation and curettage of uterus   . Orthroscopic surgery     Right knee  . Carpel tunnel release surgery    Past Medical History  Diagnosis Date  . Fibromyalgia   . Degenerative joint disease   . Hypertension   . Sleep apnea   . Cancer April 2011    Uterine Cancer  . Diabetes mellitus type II   . Lumbago   . Lack of coordination   . Disturbance of skin sensation   . Chronic pain syndrome   . Pain in limb   . Spinal stenosis, lumbar region, without neurogenic claudication   . Enthesopathy of hip region   . Pain in joint, lower leg   .  COPD (chronic obstructive pulmonary disease)    BP 138/91  Pulse 70  Resp 18  Ht 5\' 2"  (1.575 m)  Wt 361 lb 9.6 oz (164.021 kg)  BMI 66.14 kg/m2  SpO2 98%    Review of Systems  Musculoskeletal: Positive for back pain.  All other systems reviewed and are negative.       Objective:   Physical Exam Patient is alert oriented cooperative pleasant follows commands without difficulty answers questions appropriately  Cranial nerves and coordination are intact  Reflexes are diminished below the knees  Decreased sensation as noted below the knees especially over the dorsum of the right foot.  Motor strength is good and the lower extremities bilaterally 5 over 5 and hip flexors and extensors dorsiflexors plantar flexors.  Straight leg raise is negative  Transition slowly from sitting to standing.  Gait is slightly wide based with short stride length.  She uses a cane for ambulation.  Difficulty with tandem gait.  Full range of motion is noted at the knees. Effusion is not appreciated. Left medial joint line tenderness is noted. Medial and lateral joint line tenderness is noted on right knee. There's no crepitus noted today with flexion and extension at the knees.  Lumbar motion is limited in all planes.         Assessment & Plan:  1. L4-5 significant spinal stenosis, 8 mm at this level; also stenosis  at L5-S1.  2. Mild gait disorder.  3. History of trochanteric bursitis, stable.  4. Morbid obesity.  5. Bilateral knee pain consistent with osteoarthritis.    The Opioid medication pill count was appropriate. No reported significant side effects of Opioid medications noted. No aberrant behavior noted. Patient cautioned regarding operation of machinery or vehicles. Patient understands risk and benefits of these medications. There is no indication or report of risk to self or others.    Epidural steroid injections did not provide release more than 2 weeks and we will not continue to repeat them.    I continue to encourage her to walk on a daily basis and even better get involved in a arthritis pool program.   Emphasis on staying active.   Continue use of gabapentin as well as Vicodin.   The patient would like to wean down off of gabapentin over the next month and consider a trial of Lyrica.   She uses 1 Vicodin per day which she uses at bedtime.  Followup physician assistant next month

## 2011-12-03 NOTE — Patient Instructions (Signed)
I have restarted your Lyrica.  I'll see you back in one month and will probably increase your dose if you need it,  at the next visit  Have refilled your pain medication  Make sure you keep your pain medicine locked up and in a secure location  Please try to make sure you walk each day up to 3 times a day at least 5-10 minutes

## 2011-12-19 ENCOUNTER — Ambulatory Visit (INDEPENDENT_AMBULATORY_CARE_PROVIDER_SITE_OTHER): Payer: Medicare Other | Admitting: Psychiatry

## 2011-12-19 DIAGNOSIS — F331 Major depressive disorder, recurrent, moderate: Secondary | ICD-10-CM

## 2011-12-19 NOTE — Patient Instructions (Signed)
Discussed orally 

## 2011-12-19 NOTE — Progress Notes (Signed)
Patient:  [name]   DOB: December 31, 1980  MR Number: [id number]  Location: [facility] Center:  [address] [place],  [place], [address]  Start: Friday 12/19/2011 9:05 AM End: Friday 12/19/2011 9:55 AM  Provider/Observer:     Florencia Reasons, MSW, LCSW   Chief Complaint:      Chief Complaint  Patient presents with  . Depression    Reason For Service:     The patient was referred for services by psychiatrist Dr. Lolly Mustache to address trauma history and improve coping skills. The patient has a long-standing history of symptoms of anxiety and depression beginning in adolescence with symptoms worsening in the past several years. Patient has multiple health issues and states being diagnosed with uterine cancer in March 2011 resulting in patient having a total hysterectomy in April 2011. Patient also reports a history of violent thoughts towards self and periods of rage and anger. Patient is seen for follow up appointment today.  Interventions Strategy:  Supportive therapy, cognitive behavioral therapy  Participation Level:   Active  Participation Quality:  Appropriate      Behavioral Observation:  Well Groomed, appropriate, lethargic  Current Psychosocial Factors: The patient reports 3 of her younger cousins all of whom are under the age of 27 are staying with patient and her family for the summer. In addition, other relatives are also visiting for the holidays.  Content of Session:   Reviewing symptoms, processing feelings, problem solving, identifying ways to use her support system, identifying ways to communicate her needs  Current Status:   Patient reports irritability, depressed mood, and anxiety mood. She reports having fleeting suicidal ideations 2 in 3 weeks ago. She denies any current suicidal and homicidal ideations. The patient agrees to call this practice, call 911, or have someone take her to the emergency room should symptoms worsen.  Patient Progress:   Fair. Patient says she  has been in a dark place for the past few weeks. She has been experiencing increased pain as her medication has been changed from gabapentin to Lyrica. Patient reports she is on a low dose but is working with her Dr. to gradually increase the dosage. Patient reports more isolative behaviors. She also reports stress related to her younger cousins visiting for the summer. She reports they tend to run in the house and that the younger boys are constantly fussing. Patient reports increased noise and conflict is very irritating. Patient reports she has been using various coping tools including reading, listening to music, and diaphragmatic breathing. Therapist also works with patient to identify other coping techniques as well as explore the possibility of patient having some time away from home including short 30-40 minute trips away from home as well as the possibility of staying with a friend briefly.   Target Goals:   1. Improve mood as evidenced by smiling more, resuming normal interest in activities, and decreasing emotional outbursts. 2. Improve ability to set and maintain boundaries as well as assertiveness skills. 3 improve coping skills.  Last Reviewed:   03/10/2011  Goals Addressed Today:    Improve coping skills, decrease anxiety, improve assertiveness skills and ways to set and maintain boundaries  Impression/Diagnosis:   Patient presents with a long-standing history of anxiety, depression, and mood swings along with periods of rage. She also has experienced social withdrawal, crying spells and negative thoughts. Patient also has multiple health problems including degenerative disc disease and fibromyalgia. Diagnosis mood disorder NOS,  rule out major depressive disorder, rule  out PTSD  Diagnosis:  Axis I:  1. Major depressive disorder, recurrent, moderate             Axis II: Deferred

## 2011-12-25 ENCOUNTER — Other Ambulatory Visit (HOSPITAL_COMMUNITY): Payer: Self-pay | Admitting: Psychiatry

## 2011-12-25 DIAGNOSIS — F329 Major depressive disorder, single episode, unspecified: Secondary | ICD-10-CM

## 2011-12-25 MED ORDER — HYDROXYZINE PAMOATE 25 MG PO CAPS: 25.0000 mg | ORAL_CAPSULE | Freq: Three times a day (TID) | ORAL | Status: DC | PRN

## 2011-12-29 ENCOUNTER — Other Ambulatory Visit: Payer: Self-pay | Admitting: *Deleted

## 2011-12-29 MED ORDER — PREGABALIN 25 MG PO CAPS: 25.0000 mg | ORAL_CAPSULE | Freq: Three times a day (TID) | ORAL | Status: DC

## 2012-01-05 ENCOUNTER — Encounter: Payer: Self-pay | Admitting: Physical Medicine and Rehabilitation

## 2012-01-05 ENCOUNTER — Encounter: Payer: Medicare Other | Attending: Neurosurgery | Admitting: Physical Medicine and Rehabilitation

## 2012-01-05 VITALS — BP 159/84 | HR 82 | Resp 16 | Ht 62.0 in | Wt 358.0 lb

## 2012-01-05 DIAGNOSIS — M25569 Pain in unspecified knee: Secondary | ICD-10-CM | POA: Insufficient documentation

## 2012-01-05 DIAGNOSIS — R269 Unspecified abnormalities of gait and mobility: Secondary | ICD-10-CM | POA: Insufficient documentation

## 2012-01-05 DIAGNOSIS — G8929 Other chronic pain: Secondary | ICD-10-CM

## 2012-01-05 DIAGNOSIS — M48061 Spinal stenosis, lumbar region without neurogenic claudication: Secondary | ICD-10-CM | POA: Insufficient documentation

## 2012-01-05 DIAGNOSIS — R2689 Other abnormalities of gait and mobility: Secondary | ICD-10-CM

## 2012-01-05 MED ORDER — DICLOFENAC SODIUM 1 % TD GEL: 1.0000 "application " | Freq: Four times a day (QID) | TRANSDERMAL | Status: DC

## 2012-01-05 MED ORDER — PREGABALIN 50 MG PO CAPS: 50.0000 mg | ORAL_CAPSULE | Freq: Two times a day (BID) | ORAL | Status: DC

## 2012-01-05 MED ORDER — HYDROCODONE-ACETAMINOPHEN 7.5-325 MG PO TABS: 1.0000 | ORAL_TABLET | Freq: Once | ORAL | Status: AC

## 2012-01-05 NOTE — Patient Instructions (Signed)
I have increased your hydrocodone/acetaminophen from 5 mg/325 to 7.5 mg/325 per day  I have ordered some Votaren gel for your right knee  I have increased your Lyrica to 50 mg 2 times per day.  I will see you next month,let me know how your doing on the Lyrica.  I am considering forearm crutches for you to help you walk a little longer

## 2012-01-05 NOTE — Progress Notes (Signed)
Subjective:    Patient ID: [name], female    DOB: [birth year], 31 y.o.   MRN: [id number]  HPI  The patient is a 31 year old woman who is here at her [facility] physical and rehabilitative medicine for chronic pain complaints are related to her low back in her lower extremities.  Pain in her lower extremities when she walks more than 2 minutes. She underwent lumbar epidural steroid injection 07/17/2011. She reports approximately 2 weeks of relief after the injection. Her ability to ambulate improved up to 5-10 minutes.  She is here today for a refill of her pain medication.    She tells me that she used to be on Lyrica a little over a year ago and things may be of help her a little more than to gabapentin. She did not have any adverse reactions while she was on it.   She has been on Lyrica and now for 1 month taking 25mg  tid.       Pain Inventory Average Pain 6 Pain Right Now 9 My pain is constant, sharp, burning and aching  In the last 24 hours, has pain interfered with the following? General activity 9 Relation with others 9 Enjoyment of life 9 What TIME of day is your pain at its worst? all the time Sleep (in general) Fair  Pain is worse with: walking, bending, sitting and standing Pain improves with: rest, heat/ice, medication and injections Relief from Meds: 7  Mobility walk without assistance use a cane use a walker how many minutes can you walk? 5 ability to climb steps?  yes do you drive?  no use a wheelchair transfers alone  Function disabled: date disabled 2010 I need assistance with the following:  dressing, bathing, toileting, meal prep, household duties and shopping  Neuro/Psych No problems in this area  Prior Studies Any changes since last visit?  no  Physicians involved in your care Any changes since last visit?  no   Family History  Problem Relation Age of Onset  . Bipolar disorder Brother   . Other Brother     PTSD  . Bipolar disorder  Maternal Aunt    History   Social History  . Marital Status: Single    Spouse Name: N/A    Number of Children: N/A  . Years of Education: N/A   Social History Main Topics  . Smoking status: Former Smoker -- 0.2 packs/day    Quit date: 01/02/2011  . Smokeless tobacco: Never Used  . Alcohol Use: No  . Drug Use: No  . Sexually Active: No   Other Topics Concern  . None   Social History Narrative  . None   Past Surgical History  Procedure Date  . Abdominal hysterectomy   . Mouth surgery     Wisdom tooth extracted  . Dnc   . Dilation and curettage of uterus   . Orthroscopic surgery     Right knee  . Carpel tunnel release surgery    Past Medical History  Diagnosis Date  . Fibromyalgia   . Degenerative joint disease   . Hypertension   . Sleep apnea   . Cancer April 2011    Uterine Cancer  . Diabetes mellitus type II   . Lumbago   . Lack of coordination   . Disturbance of skin sensation   . Chronic pain syndrome   . Pain in limb   . Spinal stenosis, lumbar region, without neurogenic claudication   . Enthesopathy of hip region   .  Pain in joint, lower leg   . COPD (chronic obstructive pulmonary disease)    BP 159/84  Pulse 82  Resp 16  Ht 5\' 2"  (1.575 m)  Wt 358 lb (162.388 kg)  BMI 65.48 kg/m2  SpO2 89%     Review of Systems  Musculoskeletal: Positive for myalgias, back pain, arthralgias and gait problem.  All other systems reviewed and are negative.       Objective:   Physical Exam  Patient is alert oriented cooperative pleasant follows commands without difficulty answers questions appropriately  Cranial nerves and coordination are intact  Reflexes are diminished below the knees  Decreased sensation as noted below the knees especially over the dorsum of the right foot.  Motor strength is good and the lower extremities bilaterally 5 over 5 and hip flexors and extensors dorsiflexors plantar flexors.  Straight leg raise is negative  Transition  slowly from sitting to standing.  Gait is slightly wide based with short stride length. She uses a cane for ambulation.  Difficulty with tandem gait. Right lower extremity is externally rotated during stance phase. Full range of motion is noted at the knees. Effusion is not appreciated. Left medial joint line tenderness is noted. Medial and lateral joint line tenderness is noted on right knee. There's no crepitus noted today with flexion and extension at the knees.  Lumbar motion is limited in all planes.       Assessment & Plan:  1. L4-5 significant spinal stenosis, 8 mm at this level; also stenosis  at L5-S1.  2. Mild gait disorder.  3. History of trochanteric bursitis, stable.  4. Morbid obesity.  5. Bilateral knee pain consistent with osteoarthritis.  The Opioid medication pill count was appropriate. No reported significant side effects of Opioid medications noted. No aberrant behavior noted. Patient cautioned regarding operation of machinery or vehicles. Patient understands risk and benefits of these medications. There is no indication or report of risk to self or others.  Epidural steroid injections did not provide release more than 2 weeks and we will not continue to repeat them.  I continue to encourage her to walk on a daily basis and even better get involved in a arthritis pool program.  Emphasis on staying active.   She uses one  7.5 mg /day  Vicodin per day which she uses at bedtime.  Will add VOLTAREN gel to right knee 4 times a day.  I have asked her to consider forearm crutches to allow her to walk greater distances, and unload her knees.

## 2012-01-16 ENCOUNTER — Ambulatory Visit (HOSPITAL_COMMUNITY): Payer: Self-pay | Admitting: Psychiatry

## 2012-01-29 ENCOUNTER — Encounter (HOSPITAL_COMMUNITY): Payer: Self-pay | Admitting: Psychiatry

## 2012-01-29 ENCOUNTER — Ambulatory Visit (INDEPENDENT_AMBULATORY_CARE_PROVIDER_SITE_OTHER): Payer: Medicare Other | Admitting: Psychiatry

## 2012-01-29 VITALS — Wt 358.0 lb

## 2012-01-29 DIAGNOSIS — F333 Major depressive disorder, recurrent, severe with psychotic symptoms: Secondary | ICD-10-CM

## 2012-01-29 DIAGNOSIS — F4310 Post-traumatic stress disorder, unspecified: Secondary | ICD-10-CM

## 2012-01-29 DIAGNOSIS — F329 Major depressive disorder, single episode, unspecified: Secondary | ICD-10-CM

## 2012-01-29 MED ORDER — HYDROXYZINE PAMOATE 25 MG PO CAPS: 25.0000 mg | ORAL_CAPSULE | Freq: Two times a day (BID) | ORAL | Status: DC

## 2012-01-29 MED ORDER — FLUOXETINE HCL 40 MG PO CAPS: 40.0000 mg | ORAL_CAPSULE | Freq: Every evening | ORAL | Status: DC

## 2012-01-29 MED ORDER — ARIPIPRAZOLE 30 MG PO TABS: 30.0000 mg | ORAL_TABLET | Freq: Every day | ORAL | Status: DC
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
## 2012-02-02 ENCOUNTER — Encounter: Payer: Medicare Other | Attending: Neurosurgery | Admitting: Physical Medicine and Rehabilitation

## 2012-02-02 ENCOUNTER — Encounter: Payer: Self-pay | Admitting: Physical Medicine and Rehabilitation

## 2012-02-02 VITALS — BP 148/84 | HR 77 | Ht 62.0 in | Wt 362.0 lb

## 2012-02-02 DIAGNOSIS — M25569 Pain in unspecified knee: Secondary | ICD-10-CM

## 2012-02-02 DIAGNOSIS — G8929 Other chronic pain: Secondary | ICD-10-CM

## 2012-02-02 DIAGNOSIS — M48061 Spinal stenosis, lumbar region without neurogenic claudication: Secondary | ICD-10-CM | POA: Insufficient documentation

## 2012-02-02 DIAGNOSIS — R2689 Other abnormalities of gait and mobility: Secondary | ICD-10-CM

## 2012-02-02 DIAGNOSIS — R269 Unspecified abnormalities of gait and mobility: Secondary | ICD-10-CM

## 2012-02-02 MED ORDER — HYDROCODONE-ACETAMINOPHEN 7.5-325 MG PO TABS: 1.0000 | ORAL_TABLET | Freq: Every day | ORAL | Status: DC | PRN

## 2012-02-02 NOTE — Progress Notes (Signed)
Subjective:    Patient ID: [name], female    DOB: 1980/12/16, 31 y.o.   MRN: [id number]  HPI  The patient is a 31 year old woman who is here at her [facility] physical and rehabilitative medicine for chronic pain complaints are related to her low back in her lower extremities.   Pain in her lower extremities when she walks more than 2 minutes. She underwent lumbar epidural steroid injection 07/17/2011. She reports approximately 2 weeks of relief after the injection. Her ability to ambulate improved up to 5-10 minutes.   She is here today for a refill of her pain medication.   She tells me that she used to be on Lyrica a little over a year ago and things may be of help her a little more than to gabapentin. She did not have any adverse reactions while she was on it.   She has been on Lyrica and now for 1 month taking 50mg  bid      Pain Inventory Average Pain 7 Pain Right Now 9 My pain is constant, burning and aching  In the last 24 hours, has pain interfered with the following? General activity 8 Relation with others 8 Enjoyment of life 8 What TIME of day is your pain at its worst? all of the time Sleep (in general) Fair  Pain is worse with: walking, bending, sitting and standing Pain improves with: heat/ice, medication and injections Relief from Meds: 5  Mobility walk without assistance use a cane use a walker how many minutes can you walk? 5 ability to climb steps?  yes do you drive?  no  Function I need assistance with the following:  dressing, bathing, toileting, meal prep, household duties and shopping  Neuro/Psych bladder control problems spasms  Prior Studies Any changes since last visit?  no  Physicians involved in your care Any changes since last visit?  no   Family History  Problem Relation Age of Onset  . Bipolar disorder Brother   . Other Brother     PTSD  . Bipolar disorder Maternal Aunt    History   Social History  . Marital Status:  Single    Spouse Name: N/A    Number of Children: N/A  . Years of Education: N/A   Social History Main Topics  . Smoking status: Former Smoker -- 0.2 packs/day    Quit date: 01/02/2011  . Smokeless tobacco: Never Used  . Alcohol Use: No  . Drug Use: No  . Sexually Active: No   Other Topics Concern  . None   Social History Narrative  . None   Past Surgical History  Procedure Date  . Abdominal hysterectomy   . Mouth surgery     Wisdom tooth extracted  . Dnc   . Dilation and curettage of uterus   . Orthroscopic surgery     Right knee  . Carpel tunnel release surgery    Past Medical History  Diagnosis Date  . Fibromyalgia   . Degenerative joint disease   . Hypertension   . Sleep apnea   . Cancer April 2011    Uterine Cancer  . Diabetes mellitus type II   . Lumbago   . Lack of coordination   . Disturbance of skin sensation   . Chronic pain syndrome   . Pain in limb   . Spinal stenosis, lumbar region, without neurogenic claudication   . Enthesopathy of hip region   . Pain in joint, lower leg   . COPD (  chronic obstructive pulmonary disease)    BP 148/84  Pulse 77  Ht 5\' 2"  (1.575 m)  Wt 362 lb (164.202 kg)  BMI 66.21 kg/m2  SpO2 95%   Review of Systems  Musculoskeletal: Positive for back pain.       Spasms  All other systems reviewed and are negative.       Objective:   Physical Exam Patient is alert oriented cooperative pleasant follows commands without difficulty answers questions appropriately  Cranial nerves and coordination are intact  Reflexes are diminished below the knees  Decreased sensation as noted below the knees especially over the dorsum of the right foot.  Motor strength is good and the lower extremities bilaterally 5 over 5 and hip flexors and extensors dorsiflexors plantar flexors.  Straight leg raise is negative  Transition slowly from sitting to standing.  Gait is slightly wide based with short stride length. She uses a cane for  ambulation.  Difficulty with tandem gait. Right lower extremity is externally rotated during stance phase.  Full range of motion is noted at the knees. Effusion is not appreciated. Left medial joint line tenderness is noted. Medial and lateral joint line tenderness is noted on right knee. There's no crepitus noted today with flexion and extension at the knees.  Lumbar motion is limited in all planes.       Assessment & Plan:  1. L4-5 significant spinal stenosis, 8 mm at this level; also stenosis  at L5-S1.  2. Mild gait disorder.  3. History of trochanteric bursitis, stable.  4. Morbid obesity.  5. Bilateral knee pain consistent with osteoarthritis.    The Opioid medication pill count was appropriate. No reported significant side effects of Opioid medications noted. No aberrant behavior noted. Patient cautioned regarding operation of machinery or vehicles. Patient understands risk and benefits of these medications. There is no indication or report of risk to self or others.    Epidural steroid injections did not provide release more than 2 weeks and we will not continue to repeat them.   I continue to encourage her to walk on a daily basis and even better get involved in a arthritis pool program.   Emphasis on minimizing narcotic pain medication and promoting activity.   She uses one 7.5 mg /day Vicodin per day which she uses at bedtime.   Will add VOLTAREN gel to right knee 4 times a day.   I have asked her to consider forearm crutches to allow her to walk greater distances, and unload her knees.     Obesity, fibromyalgia, COPD, degenerative joint disease, hypertension, sleep apnea, chronic pain syndrome, spinal stenosis, hypertension, lumbago and chronic leg pain. Her primary care physician is Dr. Ouida Sills.  Also maintains contact with Dr. Lolly Mustache.  10/06/11 UDS consistant

## 2012-02-02 NOTE — Patient Instructions (Signed)
I have refilled your pain medications.  Please make sure you keep them locked up and in a secure location  You mentioned sometimes you have trouble sleeping and I have printed out some information on insomnia for you  We talked about forearm crutches but I understand you are doing well with your cane at this time.  Please let me know if you want to discuss this further at one of our next visit.  I will have the nurse practitioner see you over the next 2 months and I will see you in 3 months.  M office know if you want to see me sooner otherwise by nurse practitioner will make sure your medications are filled and monitered.     Insomnia Insomnia is frequent trouble falling and/or staying asleep. Insomnia can be a long term problem or a short term problem. Both are common. Insomnia can be a short term problem when the wakefulness is related to a certain stress or worry. Long term insomnia is often related to ongoing stress during waking hours and/or poor sleeping habits. Overtime, sleep deprivation itself can make the problem worse. Every little thing feels more severe because you are overtired and your ability to cope is decreased. CAUSES   Stress, anxiety, and depression.   Poor sleeping habits.   Distractions such as TV in the bedroom.   Naps close to bedtime.   Engaging in emotionally charged conversations before bed.   Technical reading before sleep.   Alcohol and other sedatives. They may make the problem worse. They can hurt normal sleep patterns and normal dream activity.   Stimulants such as caffeine for several hours prior to bedtime.   Pain syndromes and shortness of breath can cause insomnia.   Exercise late at night.   Changing time zones may cause sleeping problems (jet lag).  It is sometimes helpful to have someone observe your sleeping patterns. They should look for periods of not breathing during the night (sleep apnea). They should also look to see how long  those periods last. If you live alone or observers are uncertain, you can also be observed at a sleep clinic where your sleep patterns will be professionally monitored. Sleep apnea requires a checkup and treatment. Give your caregivers your medical history. Give your caregivers observations your family has made about your sleep.  SYMPTOMS   Not feeling rested in the morning.   Anxiety and restlessness at bedtime.   Difficulty falling and staying asleep.  TREATMENT   Your caregiver may prescribe treatment for an underlying medical disorders. Your caregiver can give advice or help if you are using alcohol or other drugs for self-medication. Treatment of underlying problems will usually eliminate insomnia problems.   Medications can be prescribed for short time use. They are generally not recommended for lengthy use.   Over-the-counter sleep medicines are not recommended for lengthy use. They can be habit forming.   You can promote easier sleeping by making lifestyle changes such as:   Using relaxation techniques that help with breathing and reduce muscle tension.   Exercising earlier in the day.   Changing your diet and the time of your last meal. No night time snacks.   Establish a regular time to go to bed.   Counseling can help with stressful problems and worry.   Soothing music and white noise may be helpful if there are background noises you cannot remove.   Stop tedious detailed work at least one hour before bedtime.  HOME CARE  INSTRUCTIONS   Keep a diary. Inform your caregiver about your progress. This includes any medication side effects. See your caregiver regularly. Take note of:   Times when you are asleep.   Times when you are awake during the night.   The quality of your sleep.   How you feel the next day.  This information will help your caregiver care for you.  Get out of bed if you are still awake after 15 minutes. Read or do some quiet activity. Keep the  lights down. Wait until you feel sleepy and go back to bed.   Keep regular sleeping and waking hours. Avoid naps.   Exercise regularly.   Avoid distractions at bedtime. Distractions include watching television or engaging in any intense or detailed activity like attempting to balance the household checkbook.   Develop a bedtime ritual. Keep a familiar routine of bathing, brushing your teeth, climbing into bed at the same time each night, listening to soothing music. Routines increase the success of falling to sleep faster.   Use relaxation techniques. This can be using breathing and muscle tension release routines. It can also include visualizing peaceful scenes. You can also help control troubling or intruding thoughts by keeping your mind occupied with boring or repetitive thoughts like the old concept of counting sheep. You can make it more creative like imagining planting one beautiful flower after another in your backyard garden.   During your day, work to eliminate stress. When this is not possible use some of the previous suggestions to help reduce the anxiety that accompanies stressful situations.  MAKE SURE YOU:   Understand these instructions.   Will watch your condition.   Will get help right away if you are not doing well or get worse.  Document Released: 05/30/2000 Document Revised: 05/22/2011 Document Reviewed: 06/30/2007 [facility] Patient Information 2012 [place],

## 2012-03-01 ENCOUNTER — Encounter
Payer: Medicare Other | Attending: Physical Medicine and Rehabilitation | Admitting: Physical Medicine and Rehabilitation

## 2012-03-01 ENCOUNTER — Encounter: Payer: Self-pay | Admitting: Physical Medicine and Rehabilitation

## 2012-03-01 VITALS — BP 134/78 | HR 78 | Resp 18 | Ht 62.0 in | Wt 368.0 lb

## 2012-03-01 DIAGNOSIS — M25569 Pain in unspecified knee: Secondary | ICD-10-CM | POA: Insufficient documentation

## 2012-03-01 DIAGNOSIS — M549 Dorsalgia, unspecified: Secondary | ICD-10-CM

## 2012-03-01 DIAGNOSIS — J4489 Other specified chronic obstructive pulmonary disease: Secondary | ICD-10-CM | POA: Insufficient documentation

## 2012-03-01 DIAGNOSIS — J449 Chronic obstructive pulmonary disease, unspecified: Secondary | ICD-10-CM | POA: Insufficient documentation

## 2012-03-01 DIAGNOSIS — M48061 Spinal stenosis, lumbar region without neurogenic claudication: Secondary | ICD-10-CM | POA: Insufficient documentation

## 2012-03-01 DIAGNOSIS — M4800 Spinal stenosis, site unspecified: Secondary | ICD-10-CM

## 2012-03-01 DIAGNOSIS — R269 Unspecified abnormalities of gait and mobility: Secondary | ICD-10-CM | POA: Insufficient documentation

## 2012-03-01 DIAGNOSIS — M76899 Other specified enthesopathies of unspecified lower limb, excluding foot: Secondary | ICD-10-CM | POA: Insufficient documentation

## 2012-03-01 MED ORDER — HYDROCODONE-ACETAMINOPHEN 7.5-325 MG PO TABS: 1.0000 | ORAL_TABLET | Freq: Every day | ORAL | Status: DC | PRN

## 2012-03-01 NOTE — Progress Notes (Signed)
Subjective:    Patient ID: [name], female    DOB: 02-28-1981, 31 y.o.   MRN: [id number]  HPI The patient is a 31 year old female female, who presents with LBP and bilateral knee pain . The symptoms started over 10 years ago. The patient complains about moderate pain, which radiate to the right LE, on the posterior side, down to the back of her right knee. She describes the pain as intermittend sharp shooting pain in her right leg, and constant dull pain in her low back  . Applying ice, taking medications , changing positions alleviate the symptoms. Prolonged sitting and standing    aggrevates the symptoms. The patient grades his pain as a 5 /10. COPD on O2 since 8 month for 24 hr per day. Patient states that she walks at [employer] leaning on the shopping cart for almost an hr, once a week. Pain Inventory Average Pain 7 Pain Right Now 5 My pain is constant, sharp, stabbing and aching  In the last 24 hours, has pain interfered with the following? General activity 5 Relation with others 5 Enjoyment of life 5 What TIME of day is your pain at its worst? All Day Sleep (in general) Fair  Pain is worse with: walking, bending, sitting and standing Pain improves with: heat/ice, medication and injections Relief from Meds: 7  Mobility walk without assistance use a cane use a walker ability to climb steps?  yes do you drive?  no  Function disabled: date disabled  I need assistance with the following:  dressing, bathing, toileting, meal prep, household duties and shopping  Neuro/Psych No problems in this area  Prior Studies Any changes since last visit?  no  Physicians involved in your care Any changes since last visit?  no   Family History  Problem Relation Age of Onset  . Bipolar disorder Brother   . Other Brother     PTSD  . Bipolar disorder Maternal Aunt    History   Social History  . Marital Status: Single    Spouse Name: N/A    Number of Children: N/A  . Years of  Education: N/A   Social History Main Topics  . Smoking status: Former Smoker -- 0.2 packs/day    Quit date: 01/02/2011  . Smokeless tobacco: Never Used  . Alcohol Use: No  . Drug Use: No  . Sexually Active: No   Other Topics Concern  . None   Social History Narrative  . None   Past Surgical History  Procedure Date  . Abdominal hysterectomy   . Mouth surgery     Wisdom tooth extracted  . Dnc   . Dilation and curettage of uterus   . Orthroscopic surgery     Right knee  . Carpel tunnel release surgery    Past Medical History  Diagnosis Date  . Fibromyalgia   . Degenerative joint disease   . Hypertension   . Sleep apnea   . Cancer April 2011    Uterine Cancer  . Diabetes mellitus type II   . Lumbago   . Lack of coordination   . Disturbance of skin sensation   . Chronic pain syndrome   . Pain in limb   . Spinal stenosis, lumbar region, without neurogenic claudication   . Enthesopathy of hip region   . Pain in joint, lower leg   . COPD (chronic obstructive pulmonary disease)    BP 134/78  Pulse 78  Resp 18  Ht 5\' 2"  (  1.575 m)  Wt 368 lb (166.924 kg)  BMI 67.31 kg/m2  SpO2 98%     Review of Systems  Constitutional: Negative.   HENT: Negative.   Eyes: Negative.   Respiratory: Positive for shortness of breath.   Cardiovascular: Negative.   Gastrointestinal: Negative.   Genitourinary: Negative.   Musculoskeletal: Positive for back pain.  Skin: Negative.   Neurological: Negative.   Hematological: Negative.   Psychiatric/Behavioral: Negative.        Objective:   Physical Exam  Constitutional: She is oriented to person, place, and time. She appears well-developed and well-nourished.       Morbidly obese  HENT:  Head: Normocephalic.  Neck: Neck supple.  Musculoskeletal: She exhibits tenderness.  Neurological: She is alert and oriented to person, place, and time.  Skin: Skin is warm and dry.  Psychiatric: She has a normal mood and affect.     Symmetric normal motor tone is noted throughout. Normal muscle bulk. Muscle testing reveals 5/5 muscle strength of the upper extremity, and 5/5 of the lower extremity. Full range of motion in upper and lower extremities. ROM of spine is restricted. Fine motor movements are normal in both hands. Sensory is intact and symmetric to light touch, pinprick and proprioception. DTR in the upper and lower extremity are present and symmetric 1+. No clonus is noted.  Patient arises from chair with difficulty. Wide based gait with a cane , able to stand on heels and toes .  No pronator drift. Rhomberg negative. On O2 24 hrs.       Assessment & Plan:  1. L4-5 significant spinal stenosis, 8 mm at this level; also stenosis  at L5-S1.  2. Mild gait disorder.  3. History of trochanteric bursitis, stable.  4. Morbid obesity.  5. Bilateral knee pain consistent with osteoarthritis.  6. COPD, on O2 24 hrs. The Opioid medication pill count was appropriate. No reported significant side effects of Opioid medications noted. No aberrant behavior noted. Patient cautioned regarding operation of machinery or vehicles. Patient understands risk and benefits of these medications. There is no indication or report of risk to self or others.  Epidural steroid injections did not provide release more than 2 weeks and we will not continue to repeat them.  I continue to encourage her to walk at lest 3 times per week for at least 30 min, and do the exercises she learned from PT, which were not hurting her.  Emphasis on minimizing narcotic pain medication and promoting activity.  She uses one 7.5 mg /day Vicodin per day, 1/2 a tablet am and 1/2 at hs.

## 2012-03-01 NOTE — Patient Instructions (Signed)
Continue with your walking program, try to walk at least 3 times a week for 30 min. Try to do the exercises you learned at PT, which are not hurting you.

## 2012-03-30 ENCOUNTER — Ambulatory Visit (HOSPITAL_COMMUNITY): Payer: Self-pay | Admitting: Psychiatry

## 2012-03-31 ENCOUNTER — Encounter: Payer: Self-pay | Admitting: Physical Medicine and Rehabilitation

## 2012-03-31 ENCOUNTER — Encounter
Payer: Medicare Other | Attending: Physical Medicine and Rehabilitation | Admitting: Physical Medicine and Rehabilitation

## 2012-03-31 VITALS — BP 157/80 | HR 81 | Resp 18 | Ht 62.0 in | Wt 373.0 lb

## 2012-03-31 DIAGNOSIS — M48061 Spinal stenosis, lumbar region without neurogenic claudication: Secondary | ICD-10-CM

## 2012-03-31 DIAGNOSIS — R269 Unspecified abnormalities of gait and mobility: Secondary | ICD-10-CM | POA: Insufficient documentation

## 2012-03-31 DIAGNOSIS — M545 Low back pain, unspecified: Secondary | ICD-10-CM

## 2012-03-31 DIAGNOSIS — J4489 Other specified chronic obstructive pulmonary disease: Secondary | ICD-10-CM | POA: Insufficient documentation

## 2012-03-31 DIAGNOSIS — M25569 Pain in unspecified knee: Secondary | ICD-10-CM | POA: Insufficient documentation

## 2012-03-31 DIAGNOSIS — J449 Chronic obstructive pulmonary disease, unspecified: Secondary | ICD-10-CM | POA: Insufficient documentation

## 2012-03-31 DIAGNOSIS — M76899 Other specified enthesopathies of unspecified lower limb, excluding foot: Secondary | ICD-10-CM | POA: Insufficient documentation

## 2012-03-31 MED ORDER — HYDROCODONE-ACETAMINOPHEN 7.5-325 MG PO TABS: 1.0000 | ORAL_TABLET | Freq: Every day | ORAL | Status: DC | PRN

## 2012-03-31 NOTE — Patient Instructions (Signed)
Try to do some of the exercises you learned from PT, try to restart your walking program.

## 2012-03-31 NOTE — Progress Notes (Signed)
Subjective:    Patient ID: [name], female    DOB: 02/18/1981, 31 y.o.   MRN: [id number]  HPI The patient is a 31 year old female female, who presents with LBP and bilateral knee pain . The symptoms started over 10 years ago. The patient complains about moderate pain, which radiate to the right LE, on the posterior side, down to the back of her right knee. She describes the pain as intermittend sharp shooting pain in her right leg, and constant dull pain in her low back . Applying ice, taking medications , changing positions alleviate the symptoms. Prolonged sitting and standing aggrevates the symptoms. The patient grades his pain as a 5 /10. COPD on O2 since 8 month for 24 hr per day. Patient states that she did not walk since her last visit, because she had severe bronchitis, for which she is treated with Ax and prednisone.   Pain Inventory Average Pain 8 Pain Right Now 5 My pain is sharp, burning, stabbing and aching  In the last 24 hours, has pain interfered with the following? General activity 7 Relation with others 7 Enjoyment of life 7 What TIME of day is your pain at its worst? All Day Sleep (in general) Fair  Pain is worse with: walking, bending, sitting, inactivity and standing Pain improves with: heat/ice, medication and injections Relief from Meds: 5  Mobility use a cane use a walker ability to climb steps?  yes do you drive?  no use a wheelchair  Function I need assistance with the following:  dressing, bathing, toileting, meal prep, household duties and shopping  Neuro/Psych No problems in this area  Prior Studies Any changes since last visit?  no  Physicians involved in your care Any changes since last visit?  no   Family History  Problem Relation Age of Onset  . Bipolar disorder Brother   . Other Brother     PTSD  . Bipolar disorder Maternal Aunt    History   Social History  . Marital Status: Single    Spouse Name: N/A    Number of Children:  N/A  . Years of Education: N/A   Social History Main Topics  . Smoking status: Former Smoker -- 0.2 packs/day    Quit date: 01/02/2011  . Smokeless tobacco: Never Used  . Alcohol Use: No  . Drug Use: No  . Sexually Active: No   Other Topics Concern  . None   Social History Narrative  . None   Past Surgical History  Procedure Date  . Abdominal hysterectomy   . Mouth surgery     Wisdom tooth extracted  . Dnc   . Dilation and curettage of uterus   . Orthroscopic surgery     Right knee  . Carpel tunnel release surgery    Past Medical History  Diagnosis Date  . Fibromyalgia   . Degenerative joint disease   . Hypertension   . Sleep apnea   . Cancer April 2011    Uterine Cancer  . Diabetes mellitus type II   . Lumbago   . Lack of coordination   . Disturbance of skin sensation   . Chronic pain syndrome   . Pain in limb   . Spinal stenosis, lumbar region, without neurogenic claudication   . Enthesopathy of hip region   . Pain in joint, lower leg   . COPD (chronic obstructive pulmonary disease)    BP 157/80  Pulse 81  Resp 18  Ht 5'  2" (1.575 m)  Wt 373 lb (169.192 kg)  BMI 68.22 kg/m2  SpO2 91%      Review of Systems  Constitutional: Negative.   HENT: Negative.   Eyes: Negative.   Respiratory: Negative.   Cardiovascular: Negative.   Gastrointestinal: Negative.   Genitourinary: Negative.   Musculoskeletal: Positive for myalgias, back pain, arthralgias and gait problem.  Skin: Negative.   Neurological: Negative.   Hematological: Negative.   Psychiatric/Behavioral: Negative.        Objective:   Physical Exam Constitutional: She is oriented to person, place, and time. She appears well-developed and well-nourished.  Morbidly obese  HENT:  Head: Normocephalic.  Neck: Neck supple.  Musculoskeletal: She exhibits tenderness.  Neurological: She is alert and oriented to person, place, and time.  Skin: Skin is warm and dry.  Psychiatric: She has a  normal mood and affect.   Symmetric normal motor tone is noted throughout. Normal muscle bulk. Muscle testing reveals 5/5 muscle strength of the upper extremity, and 5/5 of the lower extremity. Full range of motion in upper and lower extremities. ROM of spine is restricted. Fine motor movements are normal in both hands.  Sensory is intact and symmetric to light touch, pinprick and proprioception.  DTR in the upper and lower extremity are present and symmetric 1+. No clonus is noted.  Patient arises from chair with difficulty. Wide based gait with a cane , able to stand on heels and toes . No pronator drift. Rhomberg negative.  On O2 24 hrs.        Assessment & Plan:  1. L4-5 significant spinal stenosis, 8 mm at this level; also stenosis  at L5-S1.  2. Mild gait disorder.  3. History of trochanteric bursitis, stable.  4. Morbid obesity.  5. Bilateral knee pain consistent with osteoarthritis.  6. COPD, on O2 24 hrs.  The Opioid medication pill count was appropriate. No reported significant side effects of Opioid medications noted. No aberrant behavior noted. Patient cautioned regarding operation of machinery or vehicles. Patient understands risk and benefits of these medications. There is no indication or report of risk to self or others.  Epidural steroid injections did not provide release more than 2 weeks and we will not continue to repeat them.  I continue to encourage her to restart her walking program, when she feels better and do the exercises she learned from PT, which were not hurting her.  Emphasis on minimizing narcotic pain medication and promoting activity.  She uses one 7.5 mg /day Vicodin per day, 1/2 a tablet am and 1/2 at hs.

## 2012-04-08 ENCOUNTER — Ambulatory Visit (INDEPENDENT_AMBULATORY_CARE_PROVIDER_SITE_OTHER): Payer: Medicare Other | Admitting: Psychiatry

## 2012-04-08 ENCOUNTER — Encounter (HOSPITAL_COMMUNITY): Payer: Self-pay | Admitting: Psychiatry

## 2012-04-08 VITALS — Wt 366.0 lb

## 2012-04-08 DIAGNOSIS — F4310 Post-traumatic stress disorder, unspecified: Secondary | ICD-10-CM

## 2012-04-08 DIAGNOSIS — F329 Major depressive disorder, single episode, unspecified: Secondary | ICD-10-CM

## 2012-04-08 DIAGNOSIS — F323 Major depressive disorder, single episode, severe with psychotic features: Secondary | ICD-10-CM

## 2012-04-08 MED ORDER — ARIPIPRAZOLE 30 MG PO TABS: 30.0000 mg | ORAL_TABLET | Freq: Every day | ORAL | Status: DC

## 2012-04-08 MED ORDER — FLUOXETINE HCL 40 MG PO CAPS: 40.0000 mg | ORAL_CAPSULE | Freq: Every evening | ORAL | Status: DC

## 2012-04-08 MED ORDER — HYDROXYZINE PAMOATE 25 MG PO CAPS: 25.0000 mg | ORAL_CAPSULE | Freq: Two times a day (BID) | ORAL | Status: DC
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
## 2012-04-09 ENCOUNTER — Emergency Department (HOSPITAL_COMMUNITY): Payer: Medicare Other

## 2012-04-09 ENCOUNTER — Emergency Department (HOSPITAL_COMMUNITY)
Admission: EM | Admit: 2012-04-09 | Discharge: 2012-04-09 | Disposition: A | Discharge status: Home / Self Care | Payer: Medicare Other | Attending: Emergency Medicine | Admitting: Emergency Medicine

## 2012-04-09 ENCOUNTER — Encounter (HOSPITAL_COMMUNITY): Payer: Self-pay

## 2012-04-09 DIAGNOSIS — R279 Unspecified lack of coordination: Secondary | ICD-10-CM | POA: Insufficient documentation

## 2012-04-09 DIAGNOSIS — M545 Low back pain, unspecified: Secondary | ICD-10-CM | POA: Insufficient documentation

## 2012-04-09 DIAGNOSIS — Z8542 Personal history of malignant neoplasm of other parts of uterus: Secondary | ICD-10-CM | POA: Insufficient documentation

## 2012-04-09 DIAGNOSIS — R209 Unspecified disturbances of skin sensation: Secondary | ICD-10-CM | POA: Insufficient documentation

## 2012-04-09 DIAGNOSIS — J4489 Other specified chronic obstructive pulmonary disease: Secondary | ICD-10-CM | POA: Insufficient documentation

## 2012-04-09 DIAGNOSIS — M48061 Spinal stenosis, lumbar region without neurogenic claudication: Secondary | ICD-10-CM | POA: Insufficient documentation

## 2012-04-09 DIAGNOSIS — E119 Type 2 diabetes mellitus without complications: Secondary | ICD-10-CM | POA: Insufficient documentation

## 2012-04-09 DIAGNOSIS — M25569 Pain in unspecified knee: Secondary | ICD-10-CM | POA: Insufficient documentation

## 2012-04-09 DIAGNOSIS — G894 Chronic pain syndrome: Secondary | ICD-10-CM | POA: Insufficient documentation

## 2012-04-09 DIAGNOSIS — IMO0001 Reserved for inherently not codable concepts without codable children: Secondary | ICD-10-CM | POA: Insufficient documentation

## 2012-04-09 DIAGNOSIS — Z79899 Other long term (current) drug therapy: Secondary | ICD-10-CM | POA: Insufficient documentation

## 2012-04-09 DIAGNOSIS — J449 Chronic obstructive pulmonary disease, unspecified: Secondary | ICD-10-CM | POA: Insufficient documentation

## 2012-04-09 DIAGNOSIS — I10 Essential (primary) hypertension: Secondary | ICD-10-CM | POA: Insufficient documentation

## 2012-04-09 DIAGNOSIS — R748 Abnormal levels of other serum enzymes: Secondary | ICD-10-CM

## 2012-04-09 DIAGNOSIS — M76899 Other specified enthesopathies of unspecified lower limb, excluding foot: Secondary | ICD-10-CM | POA: Insufficient documentation

## 2012-04-09 DIAGNOSIS — R079 Chest pain, unspecified: Secondary | ICD-10-CM | POA: Insufficient documentation

## 2012-04-09 DIAGNOSIS — G4733 Obstructive sleep apnea (adult) (pediatric): Secondary | ICD-10-CM | POA: Insufficient documentation

## 2012-04-09 DIAGNOSIS — E669 Obesity, unspecified: Secondary | ICD-10-CM | POA: Insufficient documentation

## 2012-04-09 DIAGNOSIS — R109 Unspecified abdominal pain: Secondary | ICD-10-CM | POA: Insufficient documentation

## 2012-04-09 DIAGNOSIS — Z87891 Personal history of nicotine dependence: Secondary | ICD-10-CM | POA: Insufficient documentation

## 2012-04-09 LAB — COMPREHENSIVE METABOLIC PANEL
AST: 19 U/L (ref 0–37)
CO2: 33 mEq/L — ABNORMAL HIGH (ref 19–32)
Calcium: 9.8 mg/dL (ref 8.4–10.5)
Creatinine, Ser: 0.94 mg/dL (ref 0.50–1.10)
GFR calc non Af Amer: 79 mL/min — ABNORMAL LOW (ref >90)

## 2012-04-09 LAB — CBC WITH DIFFERENTIAL/PLATELET
Basophils Absolute: 0 10*3/uL (ref 0.0–0.1)
Basophils Relative: 0 % (ref 0–1)
Eosinophils Relative: 2 % (ref 0–5)
HCT: 39.1 % (ref 36.0–46.0)
MCHC: 32.5 g/dL (ref 30.0–36.0)
MCV: 85.2 fL (ref 78.0–100.0)
Monocytes Absolute: 0.8 10*3/uL (ref 0.1–1.0)
RDW: 14.9 % (ref 11.5–15.5)

## 2012-04-09 LAB — LIPASE, BLOOD: Lipase: 122 U/L — ABNORMAL HIGH (ref 11–59)

## 2012-04-09 MED ORDER — IOHEXOL 300 MG/ML  SOLN
100.0000 mL | Freq: Once | INTRAMUSCULAR | Status: AC | PRN
  Administered 2012-04-09: 100 mL via INTRAVENOUS

## 2012-04-09 MED ORDER — PANTOPRAZOLE SODIUM 40 MG IV SOLR
40.0000 mg | Freq: Once | INTRAVENOUS | Status: AC
  Administered 2012-04-09: 40 mg via INTRAVENOUS
  Filled 2012-04-09: qty 40

## 2012-04-09 MED ORDER — HYDROMORPHONE HCL PF 1 MG/ML IJ SOLN
1.0000 mg | Freq: Once | INTRAMUSCULAR | Status: AC
  Administered 2012-04-09: 1 mg via INTRAVENOUS
  Filled 2012-04-09: qty 1

## 2012-04-09 MED ORDER — ONDANSETRON HCL 4 MG/2ML IJ SOLN
4.0000 mg | Freq: Once | INTRAMUSCULAR | Status: AC
  Administered 2012-04-09: 4 mg via INTRAVENOUS
  Filled 2012-04-09: qty 2

## 2012-04-09 MED ORDER — GI COCKTAIL ~~LOC~~
30.0000 mL | Freq: Once | ORAL | Status: AC
  Administered 2012-04-09: 30 mL via ORAL
  Filled 2012-04-09: qty 30

## 2012-04-09 MED ORDER — PANTOPRAZOLE SODIUM 20 MG PO TBEC: 20.0000 mg | DELAYED_RELEASE_TABLET | Freq: Every day | ORAL | Status: DC

## 2012-04-09 NOTE — ED Notes (Signed)
Pt reports went to Urgent Care because she was having a flare up of her fibromyalgia.  Was sent here because was c/o pain in upper abd and chest x 1 week.  Says can't stand her clothing to touch her chest.

## 2012-04-09 NOTE — ED Provider Notes (Cosign Needed)
History   This chart was scribed for Kelli Lennert, MD by [name]. The patient was seen in room APA15/APA15. Patient's care was started at 1551.  CSN: [id number]  Arrival date & time 04/09/12  1551   First MD Initiated Contact with Patient 04/09/12 1604      Chief Complaint  Patient presents with  . Chest Pain   Patient is a 31 y.o. female presenting with abdominal pain. The history is provided by the patient. No language interpreter was used.  Abdominal Pain The primary symptoms of the illness include abdominal pain. The current episode started 1 to 2 hours ago. The onset of the illness was sudden. The problem has been gradually improving.  The patient states that she believes she is currently not pregnant. The patient has not had a change in bowel habit. Risk factors for an acute abdominal problem include a history of abdominal surgery (obesity). Symptoms associated with the illness do not include chills, anorexia, diaphoresis, constipation, hematuria or frequency. Significant associated medical issues include diabetes. Associated medical issues comments: HTN.   [name] is a 31 y.o. female who presents to the Emergency Department complaining of 2 hours of new, sudden onset, severe, constant, gradually improving epigastric pain with associated diffuse myalgia. Pain is described similar to fibromyalgia flare-up, aggravated by palpation, and alleviated by nothing. Pt denotes onset at rest and denies injury mechanism. She was referd to the Emergency Department from an urgent care facility, and she has not treated pain PTA. Pt denies fever, chills, cough, congestion, rhinorrhea, chest pain, and palpations. Pt denies SOB after compleing Pretension treatment for bronchitis 2 weeks ago. She lists a h/o hysterectomy, fibromyalgia, cancer, COPD, Diabetes mellitus, and orthoscopic surgery.    Past Medical History  Diagnosis Date  . Fibromyalgia   . Degenerative joint disease   .  Hypertension   . Sleep apnea   . Cancer April 2011    Uterine Cancer  . Diabetes mellitus type II   . Lumbago   . Lack of coordination   . Disturbance of skin sensation   . Chronic pain syndrome   . Pain in limb   . Spinal stenosis, lumbar region, without neurogenic claudication   . Enthesopathy of hip region   . Pain in joint, lower leg   . COPD (chronic obstructive pulmonary disease)    Past Surgical History  Procedure Date  . Abdominal hysterectomy   . Mouth surgery     Wisdom tooth extracted  . Dnc   . Dilation and curettage of uterus   . Orthroscopic surgery     Right knee  . Carpel tunnel release surgery    Family History  Problem Relation Age of Onset  . Bipolar disorder Brother   . Other Brother     PTSD  . Bipolar disorder Maternal Aunt    History  Substance Use Topics  . Smoking status: Former Smoker -- 0.2 packs/day    Quit date: 01/02/2011  . Smokeless tobacco: Never Used  . Alcohol Use: No   Review of Systems  Constitutional: Negative for chills and diaphoresis.  Gastrointestinal: Positive for abdominal pain. Negative for constipation and anorexia.  Genitourinary: Negative for frequency and hematuria.  Musculoskeletal: Positive for myalgias.  All other systems reviewed and are negative.   Allergies  Avocado; Flexeril; Marflex; Tramadol; and Codeine  Home Medications   Current Outpatient Rx  Name Route Sig Dispense Refill  . ARIPIPRAZOLE 30 MG PO TABS Oral Take 30 mg  by mouth at bedtime.    . ATORVASTATIN CALCIUM 80 MG PO TABS Oral Take 80 mg by mouth every evening.     Marland Kitchen DETROL LA 4 MG PO CP24 Oral Take 4 mg by mouth every morning.     Marland Kitchen ESTROGENS CONJUGATED 0.45 MG PO TABS Oral Take 0.45 mg by mouth every morning.    Marland Kitchen EXENATIDE 5 MCG/0.02ML [place] SOLN Subcutaneous Inject into the skin 2 (two) times daily with a meal.    . FLUOXETINE HCL 40 MG PO CAPS Oral Take 1 capsule (40 mg total) by mouth every evening. 30 capsule 2  .  HYDROCODONE-ACETAMINOPHEN 7.5-325 MG PO TABS Oral Take 0.5 tablets by mouth 2 (two) times daily. For pain    . HYDROXYZINE PAMOATE 25 MG PO CAPS Oral Take 1 capsule (25 mg total) by mouth 2 (two) times daily. 60 capsule 2  . LISINOPRIL 10 MG PO TABS Oral Take 10 mg by mouth every morning.     Marland Kitchen METOPROLOL TARTRATE 50 MG PO TABS Oral Take 50 mg by mouth 2 (two) times daily.      . NYSTATIN 100000 UNIT/GM EX POWD Topical Apply 1 application topically Daily. Applied to folds and under breasts as needed for irritation and rash    . PREGABALIN 50 MG PO CAPS Oral Take 1 capsule (50 mg total) by mouth 2 (two) times daily. 60 capsule 2  . ALBUTEROL SULFATE HFA 108 (90 BASE) MCG/ACT IN AERS Inhalation Inhale 2 puffs into the lungs every 6 (six) hours as needed. For rescue    . BUDESONIDE-FORMOTEROL FUMARATE 160-4.5 MCG/ACT IN AERO Inhalation Inhale 2 puffs into the lungs 2 (two) times daily.      Marland Kitchen NITROGLYCERIN 0.4 MG/HR TD PT24 Transdermal Place 1 patch onto the skin daily.      Marland Kitchen NITROGLYCERIN 0.4 MG SL SUBL Sublingual Place 0.4 mg under the tongue every 5 (five) minutes as needed.     BP 119/75  Pulse 91  Temp 99.1 F (37.3 C) (Oral)  Resp 20  Ht 5\' 2"  (1.575 m)  Wt 368 lb (166.924 kg)  BMI 67.31 kg/m2  SpO2 99%  Physical Exam  Constitutional: She is oriented to person, place, and time. She appears well-developed.       Obese.    HENT:  Head: Normocephalic and atraumatic.  Eyes: Conjunctivae normal and EOM are normal. No scleral icterus.  Neck: Neck supple. No thyromegaly present.  Cardiovascular: Normal rate and regular rhythm.  Exam reveals no gallop and no friction rub.   No murmur heard. Pulmonary/Chest: No stridor. She has no wheezes. She has no rales. She exhibits no tenderness.       Mild epigastric tenderness.  Abdominal: She exhibits no distension. There is no rebound.  Musculoskeletal: Normal range of motion. She exhibits no edema.  Lymphadenopathy:    She has no cervical  adenopathy.  Neurological: She is oriented to person, place, and time. Coordination normal.  Skin: No rash noted. No erythema.  Psychiatric: She has a normal mood and affect. Her behavior is normal.    ED Course  Procedures DIAGNOSTIC STUDIES: Oxygen Saturation is 99% on room air, normal by my interpretation.    COORDINATION OF CARE: 16:07- Evaluated Pt. Pt is awake, alert, and oriented. 16:13- Ordered DG Abd Acute W/Chest 1 time imaging .  Labs Reviewed  COMPREHENSIVE METABOLIC PANEL - Abnormal; Notable for the following:    CO2 33 (*)     Glucose, Bld 150 (*)  Albumin 3.4 (*)     GFR calc non Af Amer 79 (*)     All other components within normal limits  LIPASE, BLOOD - Abnormal; Notable for the following:    Lipase 122 (*)     All other components within normal limits  CBC WITH DIFFERENTIAL   Dg Abd Acute W/chest  04/09/2012  *RADIOLOGY REPORT*  Clinical Data: Epigastric pain and chest pain  ACUTE ABDOMEN SERIES (ABDOMEN 2 VIEW & CHEST 1 VIEW)  Comparison: 08/13/2011  Findings: The heart size and mediastinal contours are within normal limits.  Both lungs are clear.  The visualized skeletal structures are unremarkable.  The bowel gas pattern is nonspecific.  Within the right lower quadrant of the abdomen there is an air-filled loop of small bowel which measures up to 3.2 mm.  Gas is noted within the colon.  No free intraperitoneal air noted.  IMPRESSION:  1.  Non specific bowel gas pattern. 2.  Right lower quadrant small bowel loops are mildly dilated and may represent focal ileus or partial obstruction.   Original Report Authenticated By: Rosealee Albee, M.D.      No diagnosis found.    Date: 04/09/2012  Rate: 95  Rhythm: normal sinus rhythm  QRS Axis: normal  Intervals: normal  ST/T Wave abnormalities: nonspecific ST changes  Conduction Disutrbances:none  Narrative Interpretation:   Old EKG Reviewed: none available   MDM  The chart was scribed for me under my  direct supervision.  I personally performed the history, physical, and medical decision making and all procedures in the evaluation of this patient.Kelli Lennert, MD 04/09/12 2011

## 2012-04-12 ENCOUNTER — Telehealth: Payer: Self-pay | Admitting: Physical Medicine and Rehabilitation

## 2012-04-12 ENCOUNTER — Ambulatory Visit (INDEPENDENT_AMBULATORY_CARE_PROVIDER_SITE_OTHER): Payer: Medicare Other | Admitting: Psychiatry

## 2012-04-12 DIAGNOSIS — F329 Major depressive disorder, single episode, unspecified: Secondary | ICD-10-CM

## 2012-04-12 NOTE — Patient Instructions (Signed)
Discussed orally 

## 2012-04-12 NOTE — Telephone Encounter (Signed)
Patient went to ED on Friday.  Inflamed Pancreas.  ED MD told her to take more of her Hydrocodone.  Is this ok with Dr Pamelia Hoit?  Or can she give something else?

## 2012-04-12 NOTE — Progress Notes (Signed)
Patient:  [name]   DOB: 1981/04/15  MR Number: [id number]  [facility]:  [address] [place],  [place], [address]  Start: Monday 04/12/2012 10:10 AM End: Monday 04/12/2012 10:55 AM  Provider/Observer:     Florencia Reasons, MSW, LCSW   Chief Complaint:      Chief Complaint  Patient presents with  . Depression    Reason For Service:     The patient was referred for services by psychiatrist Dr. Lolly Mustache to address trauma history and improve coping skills. The patient has a long-standing history of symptoms of anxiety and depression beginning in adolescence with symptoms worsening in the past several years. Patient has multiple health issues and states being diagnosed with uterine cancer in March 2011 resulting in patient having a total hysterectomy in April 2011. Patient also reports a history of violent thoughts towards self and periods of rage and anger. Patient is seen for follow up appointment today.  Interventions Strategy:  Supportive therapy, cognitive behavioral therapy  Participation Level:   Active  Participation Quality:  Appropriate      Behavioral Observation:  Well Groomed, appropriate, pleasant  Current Psychosocial Factors: The patient reports 2 friends recently died. She also reports continued chronic health issues and increased pain.  Content of Session:   Reviewing symptoms, processing feelings, reviewing coping and relaxation techniques, reinforcing patient's use of assertiveness skills, discussing possible termination at next session  Current Status:   Patient reports improved mood, decreased anxiety, and decreased worry. She denies crying spells as well as suicidal and homicidal ideations.  Patient Progress:   Good. Patient reports is continuing to experience chronic health issues along with increased pain. However she has been doing very well emotionally and mentally. Patient reports absence of suicidal and homicidal thoughts as well as  crying spells. She states wanting to live life to the fullest each day. She has increased her involvement in activities and tries to attend church regularly. She reports becoming more connected with friends and reports increased social involvement. Her parents continued to have marital issues the patient has been able to set and maintain boundaries. She also reports improved use of assertiveness skills in her relationship with her mother.  Target Goals:   1. Improve mood as evidenced by smiling more, resuming normal interest in activities, and decreasing emotional outbursts. 2. Improve ability to set and maintain boundaries as well as assertiveness skills. 3 improve coping skills.  Last Reviewed:   04/13/2011  Goals Addressed Today:    Improve coping skills, decrease anxiety, improve assertiveness skills and ways to set and maintain boundaries  Impression/Diagnosis:   Patient presents with a long-standing history of anxiety, depression, and mood swings along with periods of rage. She also has experienced social withdrawal, crying spells and negative thoughts. Symptoms have decreased significantly in the past several months. Patient also has multiple health problems including degenerative disc disease and fibromyalgia. Diagnosis: major depressive disorde  Diagnosis:  Axis I:  1. Major depressive disorder             Axis II: Deferred

## 2012-04-12 NOTE — Telephone Encounter (Signed)
Pateint will schedule appt to discuss medications.

## 2012-04-14 ENCOUNTER — Encounter (HOSPITAL_BASED_OUTPATIENT_CLINIC_OR_DEPARTMENT_OTHER): Payer: Medicare Other | Admitting: Physical Medicine and Rehabilitation

## 2012-04-14 ENCOUNTER — Encounter (HOSPITAL_COMMUNITY): Payer: Self-pay | Admitting: Emergency Medicine

## 2012-04-14 ENCOUNTER — Encounter: Payer: Self-pay | Admitting: Physical Medicine and Rehabilitation

## 2012-04-14 ENCOUNTER — Emergency Department (HOSPITAL_COMMUNITY): Payer: Medicare Other

## 2012-04-14 ENCOUNTER — Emergency Department (HOSPITAL_COMMUNITY)
Admission: EM | Admit: 2012-04-14 | Discharge: 2012-04-14 | Disposition: A | Discharge status: Home / Self Care | Payer: Medicare Other | Attending: Emergency Medicine | Admitting: Emergency Medicine

## 2012-04-14 VITALS — BP 164/100 | HR 91 | Resp 16 | Ht 62.0 in | Wt 364.0 lb

## 2012-04-14 DIAGNOSIS — IMO0001 Reserved for inherently not codable concepts without codable children: Secondary | ICD-10-CM | POA: Insufficient documentation

## 2012-04-14 DIAGNOSIS — M797 Fibromyalgia: Secondary | ICD-10-CM

## 2012-04-14 DIAGNOSIS — E1169 Type 2 diabetes mellitus with other specified complication: Secondary | ICD-10-CM | POA: Insufficient documentation

## 2012-04-14 DIAGNOSIS — Z87891 Personal history of nicotine dependence: Secondary | ICD-10-CM | POA: Insufficient documentation

## 2012-04-14 DIAGNOSIS — M48061 Spinal stenosis, lumbar region without neurogenic claudication: Secondary | ICD-10-CM

## 2012-04-14 DIAGNOSIS — R269 Unspecified abnormalities of gait and mobility: Secondary | ICD-10-CM

## 2012-04-14 DIAGNOSIS — Z90710 Acquired absence of both cervix and uterus: Secondary | ICD-10-CM | POA: Insufficient documentation

## 2012-04-14 DIAGNOSIS — R2689 Other abnormalities of gait and mobility: Secondary | ICD-10-CM

## 2012-04-14 DIAGNOSIS — J449 Chronic obstructive pulmonary disease, unspecified: Secondary | ICD-10-CM | POA: Insufficient documentation

## 2012-04-14 DIAGNOSIS — G8929 Other chronic pain: Secondary | ICD-10-CM

## 2012-04-14 DIAGNOSIS — Z8542 Personal history of malignant neoplasm of other parts of uterus: Secondary | ICD-10-CM | POA: Insufficient documentation

## 2012-04-14 DIAGNOSIS — G894 Chronic pain syndrome: Secondary | ICD-10-CM | POA: Insufficient documentation

## 2012-04-14 DIAGNOSIS — Z8669 Personal history of other diseases of the nervous system and sense organs: Secondary | ICD-10-CM | POA: Insufficient documentation

## 2012-04-14 DIAGNOSIS — M25569 Pain in unspecified knee: Secondary | ICD-10-CM

## 2012-04-14 DIAGNOSIS — R5381 Other malaise: Secondary | ICD-10-CM | POA: Insufficient documentation

## 2012-04-14 DIAGNOSIS — J4489 Other specified chronic obstructive pulmonary disease: Secondary | ICD-10-CM | POA: Insufficient documentation

## 2012-04-14 DIAGNOSIS — I10 Essential (primary) hypertension: Secondary | ICD-10-CM | POA: Insufficient documentation

## 2012-04-14 DIAGNOSIS — Z79899 Other long term (current) drug therapy: Secondary | ICD-10-CM | POA: Insufficient documentation

## 2012-04-14 DIAGNOSIS — R109 Unspecified abdominal pain: Secondary | ICD-10-CM | POA: Insufficient documentation

## 2012-04-14 DIAGNOSIS — R739 Hyperglycemia, unspecified: Secondary | ICD-10-CM

## 2012-04-14 LAB — CBC WITH DIFFERENTIAL/PLATELET
Basophils Absolute: 0 10*3/uL (ref 0.0–0.1)
Basophils Relative: 1 % (ref 0–1)
Eosinophils Absolute: 0.1 10*3/uL (ref 0.0–0.7)
Eosinophils Relative: 1 % (ref 0–5)
HCT: 37.3 % (ref 36.0–46.0)
MCH: 27 pg (ref 26.0–34.0)
MCHC: 32.2 g/dL (ref 30.0–36.0)
MCV: 83.8 fL (ref 78.0–100.0)
Monocytes Absolute: 0.5 10*3/uL (ref 0.1–1.0)
RDW: 14.5 % (ref 11.5–15.5)

## 2012-04-14 LAB — COMPREHENSIVE METABOLIC PANEL
ALT: 31 U/L (ref 0–35)
AST: 24 U/L (ref 0–37)
Albumin: 3.4 g/dL — ABNORMAL LOW (ref 3.5–5.2)
Alkaline Phosphatase: 71 U/L (ref 39–117)
GFR calc Af Amer: >90 mL/min (ref >90)
Glucose, Bld: 171 mg/dL — ABNORMAL HIGH (ref 70–99)
Potassium: 4.2 mEq/L (ref 3.5–5.1)
Sodium: 138 mEq/L (ref 135–145)
Total Protein: 7.3 g/dL (ref 6.0–8.3)

## 2012-04-14 LAB — URINALYSIS, ROUTINE W REFLEX MICROSCOPIC
Bilirubin Urine: NEGATIVE
Hgb urine dipstick: NEGATIVE
Ketones, ur: NEGATIVE mg/dL
Nitrite: NEGATIVE
Urobilinogen, UA: 0.2 mg/dL (ref 0.0–1.0)

## 2012-04-14 MED ORDER — HYDROCODONE-ACETAMINOPHEN 7.5-325 MG PO TABS: 0.5000 | ORAL_TABLET | Freq: Four times a day (QID) | ORAL | Status: DC | PRN

## 2012-04-14 MED ORDER — HYDROCODONE-ACETAMINOPHEN 5-325 MG PO TABS: 1.0000 | ORAL_TABLET | Freq: Four times a day (QID) | ORAL | Status: DC | PRN

## 2012-04-14 MED ORDER — HYDROMORPHONE HCL PF 2 MG/ML IJ SOLN
2.0000 mg | Freq: Once | INTRAMUSCULAR | Status: AC
  Administered 2012-04-14: 2 mg via INTRAMUSCULAR
  Filled 2012-04-14: qty 1

## 2012-04-14 NOTE — Patient Instructions (Addendum)
Your BP is elevated again today (164/100). Please let your PCP know.  Recheck 151/99 at end of visit.  I understand you did not take your medication this AM and it is now 11AM. Please take your medication and recheck your BP.   I understand you were seen in the emergency room last Friday and were told you have an elevated lipase.  You will need to followup with your primary care for management of this problem.  You mentioned the emergency room doctor told you to increase your pain medication.  I will see you back next Wednesday. I have given you a week supply of Vicodin 5/325 up to 4 times per day.  Please see the emergency room, urgent care or if you can get into your PCP if you have worsening abdominal pain.

## 2012-04-14 NOTE — ED Provider Notes (Signed)
MSE was initiated and I personally evaluated the patient and placed orders (if any) at  11:52 AM on April 14, 2012.  The patient appears stable so that the remainder of the MSE may be completed by another provider.  Patient is here with c/o htn, RUQ/Epigastric abdominal pain. Evaluated 5 days ago with CT and ABD xray without finding. Denies n/v. Pain 10/10. Lipase 122.  Low 02. Place on oxygen via nasal cannula. Will order screening labs. And move to higher acuity.      Arthor Captain, PA-C 04/14/12 1156

## 2012-04-14 NOTE — Progress Notes (Signed)
Subjective:    Patient ID: [name], female    DOB: 12/11/1980, 31 y.o.   MRN: [id number]  HPI The patient is a 31 year old woman who is here at her [facility] for chronic pain complaints are related to her low back in her lower extremities. Her mother is present in the room with the permission of the patient. Pain in her lower extremities when she walks more than 2 minutes. She underwent lumbar epidural steroid injection 07/17/2011. She reports approximately 2 weeks of relief after the injection. Her ability to ambulate improved up to 5-10 minutes.   She continues to have multiple pain complaints which include low back pain, bilateral leg pain, bilateral knee pain, history of fibromyalgia.   She was recently in the emergency room for abdominal pain. She tells me she was diagnosed with pancreatitis. She was told she could increase her pain medications until she was seen by her primary care physician.  Her visit with her primary care Dr. is next week.  She is here for a refill of her pain medication.  Pain Inventory Average Pain 7 Pain Right Now 9 My pain is constant and sharp  In the last 24 hours, has pain interfered with the following? General activity 10 Relation with others 10 Enjoyment of life 10 What TIME of day is your pain at its worst? all the time Sleep (in general) Fair  Pain is worse with: walking, bending, sitting and standing Pain improves with: medication and injections Relief from Meds: 9  Mobility use a cane use a walker how many minutes can you walk? 5 ability to climb steps?  yes do you drive?  no use a wheelchair transfers alone Do you have any goals in this area?  no  Function disabled: date disabled 2009 I need assistance with the following:  dressing, bathing, toileting, meal prep, household duties and shopping Do you have any goals in this area?  no  Neuro/Psych No problems in this area  Prior Studies Any  changes since last visit?  no  Physicians involved in your care Any changes since last visit?  no   Family History  Problem Relation Age of Onset  . Bipolar disorder Brother   . Other Brother     PTSD  . Bipolar disorder Maternal Aunt    History   Social History  . Marital Status: Single    Spouse Name: N/A    Number of Children: N/A  . Years of Education: N/A   Social History Main Topics  . Smoking status: Former Smoker -- 0.2 packs/day    Quit date: 01/02/2011  . Smokeless tobacco: Never Used  . Alcohol Use: No  . Drug Use: No  . Sexually Active: No   Other Topics Concern  . None   Social History Narrative  . None   Past Surgical History  Procedure Date  . Abdominal hysterectomy   . Mouth surgery     Wisdom tooth extracted  . Dnc   . Dilation and curettage of uterus   . Orthroscopic surgery     Right knee  . Carpel tunnel release surgery    Past Medical History  Diagnosis Date  . Fibromyalgia   . Degenerative joint disease   . Hypertension   . Sleep apnea   . Cancer April 2011    Uterine Cancer  . Diabetes mellitus type II   . Lumbago   . Lack of coordination   . Disturbance of skin  sensation   . Chronic pain syndrome   . Pain in limb   . Spinal stenosis, lumbar region, without neurogenic claudication   . Enthesopathy of hip region   . Pain in joint, lower leg   . COPD (chronic obstructive pulmonary disease)    BP 164/100  Pulse 91  Resp 16  Ht 5\' 2"  (1.575 m)  Wt 364 lb (165.109 kg)  BMI 66.58 kg/m2  SpO2 97%     Review of Systems  Musculoskeletal: Positive for back pain and gait problem.  All other systems reviewed and are negative.       Objective:   Physical Exam Patient is alert oriented cooperative pleasant follows commands without difficulty answers questions appropriately  Cranial nerves and coordination are intact  Reflexes are diminished below the knees  Decreased sensation as noted below the knees especially over the  dorsum of the right foot.  Motor strength is good and the lower extremities bilaterally 5 over 5 and hip flexors and extensors dorsiflexors plantar flexors.  Straight leg raise is negative  Transition slowly from sitting to standing.  Gait is slightly wide based with short stride length. She uses a cane for ambulation.  Difficulty with tandem gait. Right lower extremity is externally rotated during stance phase.  Full range of motion is noted at the knees. Effusion is not appreciated. Left medial joint line tenderness is noted. Medial and lateral joint line tenderness is noted on right knee. There's no crepitus noted today with flexion and extension at the knees.  Lumbar motion is limited in all planes.         Assessment & Plan:  1. L4-5 significant spinal stenosis, 8 mm at this level; also stenosis  at L5-S1.  2. Mild gait disorder.  3. History of trochanteric bursitis, stable.  4. Morbid obesity.  5. Bilateral knee pain consistent with osteoarthritis.   6. Opioid dependence/Narcotic Monintering:  Pill count was appropriate  03/11/2012 urine drug screen was consistent  Evidence of aberrant behavior noted  Cautioned regarding operating machinery and vehicles on medication  No indication or report of risk to self or others  Reports good pain control with medication  No significant side effects from pain medication appreciated.  Will increase pain medication from Vicodin 7.5/325 one half tablet twice a day to Vicodin 5/325 one per os 4 times a day. #28. At this time she reports no problems with constipation her last bowel movement was this morning and was soft. Yesterday she had normal bowel movements as well. She states she has not had any problems with constipation.  Her mother verified this as well.  I will see her next week after she has seen her PCP.

## 2012-04-14 NOTE — ED Provider Notes (Signed)
History     CSN: [id number]  Arrival date & time 04/14/12  1104   First MD Initiated Contact with Patient 04/14/12 1417      Chief Complaint  Patient presents with  . Abdominal Pain  . Hypertension    (Consider location/radiation/quality/duration/timing/severity/associated sxs/prior treatment) Patient is a 31 y.o. female presenting with abdominal pain and hypertension. The history is provided by the patient.  Abdominal Pain The primary symptoms of the illness include abdominal pain and shortness of breath (chronic). The primary symptoms of the illness do not include fever, fatigue, nausea, vomiting, diarrhea, hematemesis, hematochezia, dysuria, vaginal discharge or vaginal bleeding. The current episode started more than 2 days ago (2 weeks ago ). The onset of the illness was gradual. The problem has not changed since onset. The patient states that she believes she is currently not pregnant. The patient has not had a change in bowel habit. Additional symptoms associated with the illness include heartburn. Symptoms associated with the illness do not include chills, anorexia, diaphoresis, urgency, hematuria, frequency or back pain. Significant associated medical issues include GERD and diabetes. Significant associated medical issues do not include PUD, inflammatory bowel disease, sickle cell disease, gallstones, liver disease, substance abuse, diverticulitis, HIV or cardiac disease.  Hypertension Associated symptoms include abdominal pain, arthralgias (chronic fibromyalgia) and myalgias (chronic). Pertinent negatives include no anorexia, change in bowel habit, chest pain, chills, congestion, coughing, diaphoresis, fatigue, fever, headaches, nausea, neck pain, numbness, rash, sore throat, swollen glands, urinary symptoms, vertigo, visual change, vomiting or weakness.    Past Medical History  Diagnosis Date  . Fibromyalgia   . Degenerative joint disease   . Hypertension   . Sleep apnea   .  Cancer April 2011    Uterine Cancer  . Diabetes mellitus type II   . Lumbago   . Lack of coordination   . Disturbance of skin sensation   . Chronic pain syndrome   . Pain in limb   . Spinal stenosis, lumbar region, without neurogenic claudication   . Enthesopathy of hip region   . Pain in joint, lower leg   . COPD (chronic obstructive pulmonary disease)     Past Surgical History  Procedure Date  . Abdominal hysterectomy   . Mouth surgery     Wisdom tooth extracted  . Dnc   . Dilation and curettage of uterus   . Orthroscopic surgery     Right knee  . Carpel tunnel release surgery     Family History  Problem Relation Age of Onset  . Bipolar disorder Brother   . Other Brother     PTSD  . Bipolar disorder Maternal Aunt     History  Substance Use Topics  . Smoking status: Former Smoker -- 0.2 packs/day    Quit date: 01/02/2011  . Smokeless tobacco: Never Used  . Alcohol Use: No    OB History    Grav Para Term Preterm Abortions TAB SAB Ect Mult Living                  Review of Systems  Constitutional: Negative for fever, chills, diaphoresis and fatigue.  HENT: Negative for congestion, sore throat and neck pain.   Respiratory: Positive for shortness of breath (chronic). Negative for cough.   Cardiovascular: Negative for chest pain.  Gastrointestinal: Positive for heartburn and abdominal pain. Negative for nausea, vomiting, diarrhea, hematochezia, anorexia, change in bowel habit and hematemesis.  Genitourinary: Negative for dysuria, urgency, frequency, hematuria, vaginal bleeding and vaginal discharge.  Musculoskeletal: Positive for myalgias (chronic) and arthralgias (chronic fibromyalgia). Negative for back pain.  Skin: Negative for rash.  Neurological: Negative for vertigo, weakness, numbness and headaches.  All other systems reviewed and are negative.    Allergies  Avocado; Flexeril; Marflex; Tramadol; and Codeine  Home Medications   Current Outpatient  Rx  Name Route Sig Dispense Refill  . ALBUTEROL SULFATE HFA 108 (90 BASE) MCG/ACT IN AERS Inhalation Inhale 2 puffs into the lungs every 6 (six) hours as needed. For rescue    . ARIPIPRAZOLE 30 MG PO TABS Oral Take 30 mg by mouth at bedtime.    . ATORVASTATIN CALCIUM 80 MG PO TABS Oral Take 80 mg by mouth every evening.     Marland Kitchen DETROL LA 4 MG PO CP24 Oral Take 4 mg by mouth every morning.     Marland Kitchen ESTROGENS CONJUGATED 0.45 MG PO TABS Oral Take 0.45 mg by mouth every morning.    Marland Kitchen EXENATIDE 5 MCG/0.02ML [place] SOLN Subcutaneous Inject into the skin 2 (two) times daily with a meal.    . FLUOXETINE HCL 40 MG PO CAPS Oral Take 1 capsule (40 mg total) by mouth every evening. 30 capsule 2  . HYDROCODONE-ACETAMINOPHEN 5-325 MG PO TABS Oral Take 1 tablet by mouth every 6 (six) hours as needed for pain. 30 tablet 0  . HYDROXYZINE PAMOATE 25 MG PO CAPS Oral Take 1 capsule (25 mg total) by mouth 2 (two) times daily. 60 capsule 2  . LISINOPRIL 10 MG PO TABS Oral Take 10 mg by mouth every morning.     Marland Kitchen METOPROLOL TARTRATE 50 MG PO TABS Oral Take 50 mg by mouth 2 (two) times daily.      . NYSTATIN 100000 UNIT/GM EX POWD Topical Apply 1 application topically Daily. Applied to folds and under breasts as needed for irritation and rash    . PANTOPRAZOLE SODIUM 20 MG PO TBEC Oral Take 1 tablet (20 mg total) by mouth daily. 30 tablet 0  . PREGABALIN 50 MG PO CAPS Oral Take 1 capsule (50 mg total) by mouth 2 (two) times daily. 60 capsule 2  . NITROGLYCERIN 0.4 MG SL SUBL Sublingual Place 0.4 mg under the tongue every 5 (five) minutes as needed.      BP 117/79  Pulse 93  Temp 98.3 F (36.8 C) (Oral)  Resp 20  SpO2 98%  Physical Exam  Nursing note and vitals reviewed. Constitutional: She is oriented to person, place, and time. She appears well-developed and well-nourished. No distress.  HENT:  Head: Normocephalic and atraumatic.       Mucous membranes moist, uvula midline, no exudate  Eyes: Conjunctivae normal and  EOM are normal.  Neck: Normal range of motion.       No lymphadenopathy  Cardiovascular:       RRR, intact distal pulses  Pulmonary/Chest: Effort normal.       Chronic wheezing, 2LNC w pulse ox at 100% (pts baseline), no respiratory distress  Abdominal:       Morbidly obese, diffuse abdominal ttp. No peritoneal signs. No masses or pulsatile aorta  Musculoskeletal: Normal range of motion.       Diffuse muscular ttp of all extremities. Normal ROM  Neurological: She is alert and oriented to person, place, and time.  Skin: Skin is warm and dry. No rash noted. She is not diaphoretic.  Psychiatric: She has a normal mood and affect. Her behavior is normal.    ED Course  Procedures (including critical care  time)  Labs Reviewed  COMPREHENSIVE METABOLIC PANEL - Abnormal; Notable for the following:    CO2 33 (*)     Glucose, Bld 171 (*)     Albumin 3.4 (*)     All other components within normal limits  CBC WITH DIFFERENTIAL  URINALYSIS, ROUTINE W REFLEX MICROSCOPIC  LIPASE, BLOOD   US Abdomen Complete  04/14/2012  *RADIOLOGY REPORT*  Clinical Data:  Right upper quadrant pain.  Diabetic.  COMPLETE ABDOMINAL ULTRASOUND  Comparison:  04/09/2012 CT.  Findings:  Gallbladder:  No gallstones, gallbladder wall thickening, or pericholecystic fluid.  Common bile duct:  3.9 mm.  Liver:  Mild fatty infiltration without focal mass.  IVC:  Appears normal.  Pancreas:  Poorly delineated secondary to bowel gas.  Spleen:  9.5 cm.  No focal mass.  Right Kidney:  12.9 cm. No hydronephrosis or renal mass.  Left Kidney:  11.8 cm. No hydronephrosis or renal mass.  Abdominal aorta:  Mid and distal aspect not visualized secondary to bowel gas.  Proximal aspect without abdominal aortic aneurysm.  IMPRESSION: No gallstones or findings of cholecystitis.  Mild fatty infiltration of the liver.  Limited evaluation of the pancreas and abdominal aorta secondary to bowel gas and patient's habitus.   Original Report Authenticated  By: Fuller Canada, M.D.      No diagnosis found.  BP 117/79  Pulse 93  Temp 98.3 F (36.8 C) (Oral)  Resp 20  SpO2 98%   MDM  Abdominal pain  31 year old morbidly obese diabetic patient with a history of fibromyalgia & COPD presents emergency department complaining of abdominal pain onset 2 weeks ago.  Patient was evaluated for similar presentation 5 days ago at [facility] and an acute abdominal series & CT abdomen were performed showing no acute abdominal process.  Today ultrasound was performed with no evidence of cholecystitis. labs reviewed without abnormality.  Patient advised to followup with gastroenterologist for unknown etiology and with her primary care physician for high blood pressure.  Pain managed well in the emergency department.  Vital signs normal and patient in no distress prior to discharge.          Jaci Carrel, [place] 04/14/12 1627

## 2012-04-14 NOTE — ED Notes (Signed)
Family at bedside. 

## 2012-04-14 NOTE — ED Provider Notes (Signed)
Medical screening examination/treatment/procedure(s) were performed by non-physician practitioner and as supervising physician I was immediately available for consultation/collaboration.    Shakeerah Gradel R Vianny Schraeder, MD 04/14/12 1635 

## 2012-04-14 NOTE — ED Notes (Signed)
Pt reports recent hx of abd pain. Denies N/V , constipation. Seen in ED  4 days ago. Referred to PCP for follow up. Pt c/o persistant pain and seen today at pain management clinic. Dx of elevated BP in clinic

## 2012-04-14 NOTE — ED Provider Notes (Signed)
Medical screening examination/treatment/procedure(s) were performed by non-physician practitioner and as supervising physician I was immediately available for consultation/collaboration.   Glynn Octave, MD 04/14/12 1640

## 2012-04-20 ENCOUNTER — Other Ambulatory Visit (HOSPITAL_COMMUNITY): Payer: Self-pay | Admitting: Internal Medicine

## 2012-04-20 DIAGNOSIS — R1011 Right upper quadrant pain: Secondary | ICD-10-CM

## 2012-04-21 ENCOUNTER — Encounter: Payer: Medicare Other | Attending: Neurosurgery | Admitting: Physical Medicine and Rehabilitation

## 2012-04-21 ENCOUNTER — Encounter: Payer: Self-pay | Admitting: Physical Medicine and Rehabilitation

## 2012-04-21 VITALS — BP 137/81 | HR 93 | Resp 20 | Ht 62.0 in | Wt 374.2 lb

## 2012-04-21 DIAGNOSIS — M25569 Pain in unspecified knee: Secondary | ICD-10-CM | POA: Insufficient documentation

## 2012-04-21 DIAGNOSIS — Z760 Encounter for issue of repeat prescription: Secondary | ICD-10-CM

## 2012-04-21 DIAGNOSIS — R269 Unspecified abnormalities of gait and mobility: Secondary | ICD-10-CM | POA: Insufficient documentation

## 2012-04-21 DIAGNOSIS — G8929 Other chronic pain: Secondary | ICD-10-CM

## 2012-04-21 DIAGNOSIS — M48061 Spinal stenosis, lumbar region without neurogenic claudication: Secondary | ICD-10-CM | POA: Insufficient documentation

## 2012-04-21 MED ORDER — HYDROCODONE-ACETAMINOPHEN 5-325 MG PO TABS: 1.0000 | ORAL_TABLET | Freq: Four times a day (QID) | ORAL | Status: DC | PRN

## 2012-04-21 NOTE — Progress Notes (Signed)
Subjective:    Patient ID: [name], female    DOB: 1981/04/05, 31 y.o.   MRN: [id number]  HPI The patient is a 31 year old woman who is here at her [facility] for chronic pain complaints are related to her low back in her lower extremities. Her mother is present in the room with the permission of the patient.    Pain in her lower extremities when she walks more than 2 minutes. She underwent lumbar epidural steroid injection 07/17/2011. She reports approximately 2 weeks of relief after the injection. Her ability to ambulate improved up to 5-10 minutes.    She continues to have multiple pain complaints which include low back pain, bilateral leg pain, bilateral knee pain, history of fibromyalgia.   She was recently in the emergency room for abdominal pain. She tells me her pcp would like her to continue her current pain medication dosage.  Her visit with her primary care Dr. is next week.  She reports no problems with constipation, bowel movements are daily and soft. Patient reports eating 2-3 times per day without difficulty. No complaints of dysuria.  She is here for a refill of her pain medication.    Pain Inventory Average Pain 7 Pain Right Now 7 My pain is constant, sharp, burning, stabbing and aching  In the last 24 hours, has pain interfered with the following? General activity 8 Relation with others 8 Enjoyment of life 8 What TIME of day is your pain at its worst? all the time Sleep (in general) Fair  Pain is worse with: walking, bending, sitting and standing Pain improves with: heat/ice, medication and injections Relief from Meds: 8  Mobility walk without assistance use a cane use a walker how many minutes can you walk? 5 minutes ability to climb steps?  yes do you drive?  no use a wheelchair transfers alone Do you have any goals in this area?  no  Function disabled: date disabled 2010 or 2011  Neuro/Psych No problems in  this area  Prior Studies Any changes since last visit?  yes ultrasound of abdomen  Physicians involved in your care Any changes since last visit?  no   Family History  Problem Relation Age of Onset  . Bipolar disorder Brother   . Other Brother     PTSD  . Bipolar disorder Maternal Aunt    History   Social History  . Marital Status: Single    Spouse Name: N/A    Number of Children: N/A  . Years of Education: N/A   Social History Main Topics  . Smoking status: Former Smoker -- 0.2 packs/day    Quit date: 01/02/2011  . Smokeless tobacco: Never Used  . Alcohol Use: No  . Drug Use: No  . Sexually Active: No   Other Topics Concern  . None   Social History Narrative  . None   Past Surgical History  Procedure Date  . Abdominal hysterectomy   . Mouth surgery     Wisdom tooth extracted  . Dnc   . Dilation and curettage of uterus   . Orthroscopic surgery     Right knee  . Carpel tunnel release surgery    Past Medical History  Diagnosis Date  . Fibromyalgia   . Degenerative joint disease   . Hypertension   . Sleep apnea   . Cancer April 2011    Uterine Cancer  . Diabetes mellitus type II   . Lumbago   . Lack  of coordination   . Disturbance of skin sensation   . Chronic pain syndrome   . Pain in limb   . Spinal stenosis, lumbar region, without neurogenic claudication   . Enthesopathy of hip region   . Pain in joint, lower leg   . COPD (chronic obstructive pulmonary disease)    BP 137/81  Pulse 93  Resp 20  Ht 5\' 2"  (1.575 m)  Wt 374 lb 3.2 oz (169.736 kg)  BMI 68.44 kg/m2  SpO2 95%    Review of Systems  Gastrointestinal: Positive for abdominal pain.  Musculoskeletal: Positive for back pain.       Objective:   Physical Exam non icteric sclera The patient does not appear in any distress. Smiling at times. She sat on the exam table without difficulty.  Patient is alert oriented cooperative pleasant follows commands without difficulty answers  questions appropriately  Cranial nerves and coordination are intact  Reflexes are diminished below the knees  Decreased sensation as noted below the knees especially over the dorsum of the right foot.  Motor strength is good and the lower extremities bilaterally 5 over 5 and hip flexors and extensors dorsiflexors plantar flexors.  Straight leg raise is negative  Transition slowly from sitting to standing.  Gait is slightly wide based with short stride length. She uses a cane for ambulation.  Difficulty with tandem gait. Right lower extremity is externally rotated during stance phase.  Full range of motion is noted at the knees. Effusion is not appreciated. Left medial joint line tenderness is noted. Medial and lateral joint line tenderness is noted on right knee. There's no crepitus noted today with flexion and extension at the knees.  Lumbar motion is limited in all planes.   Limited abdominal exam.  Abdomen obese, soft, BS+      Assessment & Plan:  1. L4-5 significant spinal stenosis, 8 mm at this level; also stenosis  at L5-S1.  2. Mild gait disorder.  3. History of trochanteric bursitis, stable.  4. Morbid obesity.  5. Bilateral knee pain consistent with osteoarthritis.   6. Opioid dependence/Narcotic Monintering:  Pill count was appropriate  03/11/2012 urine drug screen was consistent  Evidence of aberrant behavior noted  Cautioned regarding operating machinery and vehicles on medication  No indication or report of risk to self or others  Reports good pain control with medication  No significant side effects from pain medication appreciated.  Will increase pain medication from Vicodin 7.5/325 one half tablet twice a day to Vicodin 5/325 one per os 4 times a day. #28. At this time she reports no problems with constipation her last bowel movement was this morning and was soft. Yesterday she had normal bowel movements as well. She states she has not had any problems with  constipation. Her mother verified this as well.  I will see her next week after she has seen her PCP.   She tells me her pcp has biliary scan ordered.  I have called Dr. Karleen Hampshire office while the patient was here today to clarify plan regarding her abdominal pain.  Phone message stated that the office was closed today.[phone].    I will see her in 5 days.  If she has worsening of her pain in the interim she will followup at ER/urgent care or if she can get in her primary care Dr.

## 2012-04-21 NOTE — Patient Instructions (Addendum)
Follow up Monday 04/26/12. Maintain contact with primary care.  I understand your pcp scheduled you for a scan.  If your abd pain worsens please see urgent care/ED or PCP.  I have tried to call dr. Ouida Sills today to clarify plan regarding your new abdominal pain. You mentioned that he has requested that I refill your pain medication. I have refilled your pain medication.

## 2012-04-22 ENCOUNTER — Encounter (HOSPITAL_COMMUNITY): Payer: Self-pay

## 2012-04-22 ENCOUNTER — Encounter (HOSPITAL_COMMUNITY)
Admission: RE | Admit: 2012-04-22 | Discharge: 2012-04-22 | Disposition: A | Discharge status: Home / Self Care | Payer: Medicare Other | Source: Ambulatory Visit | Attending: Internal Medicine | Admitting: Internal Medicine

## 2012-04-22 DIAGNOSIS — R1011 Right upper quadrant pain: Secondary | ICD-10-CM

## 2012-04-22 HISTORY — DX: Unspecified asthma, uncomplicated: J45.909

## 2012-04-22 MED ORDER — TECHNETIUM TC 99M MEBROFENIN IV KIT
5.0000 | PACK | Freq: Once | INTRAVENOUS | Status: AC | PRN
  Administered 2012-04-22: 5.5 via INTRAVENOUS

## 2012-04-26 ENCOUNTER — Encounter (HOSPITAL_BASED_OUTPATIENT_CLINIC_OR_DEPARTMENT_OTHER): Payer: Medicare Other | Admitting: Physical Medicine and Rehabilitation

## 2012-04-26 ENCOUNTER — Encounter: Payer: Self-pay | Admitting: Physical Medicine and Rehabilitation

## 2012-04-26 VITALS — BP 140/90 | HR 79 | Resp 20 | Ht 62.0 in | Wt 374.2 lb

## 2012-04-26 DIAGNOSIS — R269 Unspecified abnormalities of gait and mobility: Secondary | ICD-10-CM

## 2012-04-26 DIAGNOSIS — G8929 Other chronic pain: Secondary | ICD-10-CM

## 2012-04-26 DIAGNOSIS — M25569 Pain in unspecified knee: Secondary | ICD-10-CM

## 2012-04-26 DIAGNOSIS — R2689 Other abnormalities of gait and mobility: Secondary | ICD-10-CM

## 2012-04-26 DIAGNOSIS — M48061 Spinal stenosis, lumbar region without neurogenic claudication: Secondary | ICD-10-CM

## 2012-04-26 MED ORDER — PREGABALIN 75 MG PO CAPS: 75.0000 mg | ORAL_CAPSULE | Freq: Two times a day (BID) | ORAL | Status: DC

## 2012-04-26 MED ORDER — HYDROCODONE-ACETAMINOPHEN 7.5-325 MG PO TABS: 0.5000 | ORAL_TABLET | Freq: Two times a day (BID) | ORAL | Status: DC

## 2012-04-26 NOTE — Progress Notes (Signed)
Subjective:    Patient ID: [name], female    DOB: 16-Jul-1980, 31 y.o.   MRN: [id number]  HPI The patient is a 31 year old woman who is here at her [facility] for chronic pain complaints are related to her low back in her lower extremities. Her mother and niece are present in the room with the permission of the patient.  Pain in her lower extremities when she walks more than 2 minutes. She underwent lumbar epidural steroid injection 07/17/2011. She reports approximately 2 weeks of relief after the injection. Her ability to ambulate improved up to 5-10 minutes.   She continues to have multiple pain complaints which include low back pain, bilateral leg pain, bilateral knee pain, history of fibromyalgia.   She has recently had some problems with right upper quadrant abdominal pain and has undergone testing for this. She has had abdominal/pelvis CT with contrast 04/09/2012, abdominal/chest x-ray 10/25 2013, abdominal ultrasound 10/30 2013, 04/21/19/2013 nuclear medicine hepatobiliary imaging.  Prior to her visit today I spoke with her primary care physician ( Dr. Ouida Sills) He tells me he's planning to send her to gastroenterologist. He is comfortable with my plan to decrease her pain medication back to 7.5/325 one half tab bid from 5/325 twice a day.  She reports no problems with constipation, bowel movements are daily and soft. Patient reports eating 2-3 times per day without difficulty. No complaints of dysuria.  She is here for a refill of her pain medication.     Pain Inventory Average Pain 7 Pain Right Now 7 My pain is constant, sharp, burning and stabbing  In the last 24 hours, has pain interfered with the following? General activity 10 Relation with others 10 Enjoyment of life 10 What TIME of day is your pain at its worst? all of the time Sleep (in general) Fair  Pain is worse with: walking, bending, sitting and standing Pain improves with: rest,  heat/ice, medication and injections Relief from Meds: 6  Mobility use a cane use a walker how many minutes can you walk? 5 ability to climb steps?  yes do you drive?  no  Function disabled: date disabled  I need assistance with the following:  dressing, bathing, toileting, meal prep, household duties and shopping  Neuro/Psych No problems in this area  Prior Studies Any changes since last visit?  no  Physicians involved in your care Any changes since last visit?  no   Family History  Problem Relation Age of Onset  . Bipolar disorder Brother   . Other Brother     PTSD  . Bipolar disorder Maternal Aunt    History   Social History  . Marital Status: Single    Spouse Name: N/A    Number of Children: N/A  . Years of Education: N/A   Social History Main Topics  . Smoking status: Former Smoker -- 0.2 packs/day    Quit date: 01/02/2011  . Smokeless tobacco: Never Used  . Alcohol Use: No  . Drug Use: No  . Sexually Active: No   Other Topics Concern  . None   Social History Narrative  . None   Past Surgical History  Procedure Date  . Abdominal hysterectomy   . Mouth surgery     Wisdom tooth extracted  . Dnc   . Dilation and curettage of uterus   . Orthroscopic surgery     Right knee  . Carpel tunnel release surgery    Past Medical History  Diagnosis Date  . Fibromyalgia   . Degenerative joint disease   . Hypertension   . Sleep apnea   . Cancer April 2011    Uterine Cancer  . Diabetes mellitus type II   . Lumbago   . Lack of coordination   . Disturbance of skin sensation   . Chronic pain syndrome   . Pain in limb   . Spinal stenosis, lumbar region, without neurogenic claudication   . Enthesopathy of hip region   . Pain in joint, lower leg   . COPD (chronic obstructive pulmonary disease)   . Asthma    BP 140/90  Pulse 79  Resp 20  Ht 5\' 2"  (1.575 m)  Wt 374 lb 3.2 oz (169.736 kg)  BMI 68.44 kg/m2  SpO2 95%    Review of Systems    Musculoskeletal: Positive for back pain.  All other systems reviewed and are negative.       Objective:   Physical Exam  non icteric sclera  The patient does not appear in any distress. Smiling at times. She sat on the exam table without difficulty.  Patient is alert oriented cooperative pleasant follows commands without difficulty answers questions appropriately  Cranial nerves and coordination are intact  Reflexes are diminished below the knees  Decreased sensation as noted below the knees especially over the dorsum of the right foot.   Thoracic dermatomes are evaluated today and there was no decreased or hypersensitive areas noted. Motor strength is good and the lower extremities bilaterally 5 over 5 and hip flexors and extensors dorsiflexors plantar flexors.  Straight leg raise is negative  Transition slowly from sitting to standing.  Gait is slightly wide based with short stride length. She uses a cane for ambulation.  Difficulty with tandem gait. Right lower extremity is externally rotated during stance phase.  Full range of motion is noted at the knees. Effusion is not appreciated. Left medial joint line tenderness is noted. Medial and lateral joint line tenderness is noted on right knee. There's no crepitus noted today with flexion and extension at the knees.  Lumbar motion is limited in all planes.    Limited abdominal exam. Abdomen obese, soft, BS+        Assessment & Plan:  1. L4-5 significant spinal stenosis, 8 mm at this level; also stenosis  at L5-S1.  2. Mild gait disorder.  3. History of trochanteric bursitis, stable.  4. Morbid obesity.  5. Bilateral knee pain consistent with osteoarthritis.  6. Opioid dependence/Narcotic Monintering:  Pill count was appropriate  03/11/2012 urine drug screen was consistent  Evidence of aberrant behavior noted  Cautioned regarding operating machinery and vehicles on medication  No indication or report of risk to self or  others  Reports good pain control with medication  No significant side effects from pain medication appreciated.   Plan. Will increase Lyrica to 75 milligrams twice a day. Risks and benefits discussed  Will decrease Vicodin 5 mg/325 twice a day to 7.5 mg 0.5 tablets twice a day. #30  Will see patient back in one month  Encourage her to maintain contact with primary care and specialists.  Family is comfortable with the plan

## 2012-04-26 NOTE — Patient Instructions (Signed)
I have increased your Lyrica to 75 mg twice a day.  I have decreased your pain medication Vicodin 5/325 twice a day to 7.5/325 (one half tablet) twice a day.  I have discussed your case with Dr. Ouida Sills and he told me he was planning to refer you to a gastroenterologist.  Please maintain contact with your primary care Dr. as well as your specialists.  I will see you back in one month.

## 2012-04-28 ENCOUNTER — Ambulatory Visit: Payer: Self-pay | Admitting: Physical Medicine and Rehabilitation

## 2012-04-29 ENCOUNTER — Ambulatory Visit (INDEPENDENT_AMBULATORY_CARE_PROVIDER_SITE_OTHER): Payer: Medicare Other | Admitting: Gastroenterology

## 2012-04-29 ENCOUNTER — Encounter: Payer: Self-pay | Admitting: Gastroenterology

## 2012-04-29 VITALS — BP 130/70 | HR 78 | Temp 97.4°F | Ht 62.0 in | Wt 373.6 lb

## 2012-04-29 DIAGNOSIS — R1314 Dysphagia, pharyngoesophageal phase: Secondary | ICD-10-CM

## 2012-04-29 DIAGNOSIS — R1013 Epigastric pain: Secondary | ICD-10-CM | POA: Insufficient documentation

## 2012-04-29 DIAGNOSIS — R1310 Dysphagia, unspecified: Secondary | ICD-10-CM

## 2012-04-29 DIAGNOSIS — Q433 Congenital malformations of intestinal fixation: Secondary | ICD-10-CM

## 2012-04-29 DIAGNOSIS — K219 Gastro-esophageal reflux disease without esophagitis: Secondary | ICD-10-CM

## 2012-04-29 MED ORDER — OMEPRAZOLE 20 MG PO CPDR: 20.0000 mg | DELAYED_RELEASE_CAPSULE | Freq: Every day | ORAL | Status: DC

## 2012-04-29 NOTE — Progress Notes (Signed)
REVIEWED.  

## 2012-04-29 NOTE — Assessment & Plan Note (Signed)
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 Informed patient that her appendix is in the LLQ rather than the RLQ, for FYI purposes.

## 2012-04-29 NOTE — Assessment & Plan Note (Signed)
Recent odynophagia and esophageal dysphagia in setting of chronic GERD/regurgitation. Odynophagia resolved after starting protonix. Still with esophageal dysphagia. 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 She also has one month h/o epigastric pain which is not necessarily related to meals. Etiology not well-defined. She had an isolated mildly elevated lipase of 122 on 04/09/12 which is likely nonspecific. No evidence of pancreatitis on CT. GB w/u was unremarkable. Her UGI tract needs to be evaluated via EGD/ED at this point. Due to polypharmacy we will augment conscious sedation with Phenergan 25mg  IV 30 minutes before the procedure. Her protonix is not covered by insurance. When she completes therapy she will switch to omeprazole 20mg  daily (has been sent to her pharmacy).   Given her morbid obesity, lung disease, sleep apnea she is at increase risk of conscious sedation. Discussed with her and her mother today.  I have discussed the risks, alternatives, benefits of EGD/ED with regards to but not limited to the risk of reaction to medication, bleeding, infection, perforation and the patient is agreeable to proceed. Written consent to be obtained.

## 2012-04-29 NOTE — Progress Notes (Signed)
Faxed to PCP

## 2012-04-29 NOTE — Progress Notes (Signed)
Primary Care Physician:  FAGAN,ROY, MD  Primary Gastroenterologist:  Michael Rourk, MD    Chief Complaint  Patient presents with  . Abdominal Pain    HPI:  [name] is a 31 y.o. female here for further evaluation of abdominal pain.  One month history of odynophagia, epigastric/upper abdominal pain. Seen in ED twice. Work up has included CT A/P, abd u/s, HIDA without explanation for pain. Protonix started after one of her EF visits and has helped odynophagia. Pain in epigastric and both upper quadrants persistent. Not necessarily worse with meals or positional. Some nausea but no vomiting. H/O nocturnal regurgitation but that is better on Protonix. Some dysphagia with meats, has to wash down. BM 3-4 times per day. No melena, brbpr.  Protonix is not covered by her insurance.    Current Outpatient Prescriptions  Medication Sig Dispense Refill  . albuterol (PROAIR HFA) 108 (90 BASE) MCG/ACT inhaler Inhale 2 puffs into the lungs every 6 (six) hours as needed. For rescue      . ARIPiprazole (ABILIFY) 30 MG tablet Take 30 mg by mouth at bedtime.      . atorvastatin (LIPITOR) 80 MG tablet Take 80 mg by mouth every evening.       . DETROL LA 4 MG 24 hr capsule Take 4 mg by mouth every morning.       . estrogens, conjugated, (PREMARIN) 0.45 MG tablet Take 0.45 mg by mouth every morning.      . exenatide (BYETTA) 5 MCG/0.02ML SOLN Inject into the skin 2 (two) times daily with a meal.      . FLUoxetine (PROZAC) 40 MG capsule Take 1 capsule (40 mg total) by mouth every evening.  30 capsule  2  . HYDROcodone-acetaminophen (NORCO) 7.5-325 MG per tablet Take 0.5 tablets by mouth 2 (two) times daily.  30 tablet  0  . hydrOXYzine (VISTARIL) 25 MG capsule Take 1 capsule (25 mg total) by mouth 2 (two) times daily.  60 capsule  2  . lisinopril (PRINIVIL,ZESTRIL) 10 MG tablet Take 10 mg by mouth every morning.       . metoprolol (LOPRESSOR) 50 MG tablet Take 50 mg by mouth 2 (two) times daily.        .  nitroGLYCERIN (NITROSTAT) 0.4 MG SL tablet Place 0.4 mg under the tongue every 5 (five) minutes as needed.      . nystatin (MYCOSTATIN) powder Apply 1 application topically Daily. Applied to folds and under breasts as needed for irritation and rash      . pantoprazole (PROTONIX) 20 MG tablet Take 1 tablet (20 mg total) by mouth daily.  30 tablet  0  . pregabalin (LYRICA) 75 MG capsule Take 1 capsule (75 mg total) by mouth 2 (two) times daily.  60 capsule  1    Allergies as of 04/29/2012 - Review Complete 04/29/2012  Allergen Reaction Noted  . Avocado Anaphylaxis 07/08/2011  . Flexeril (cyclobenzaprine hcl) Anaphylaxis 07/08/2011  . Marflex (orphenadrine citrate) Anaphylaxis 05/03/2011  . Tramadol Itching 05/03/2011  . Codeine Itching 02/20/2009  . Cyclobenzaprine  04/22/2012    Past Medical History  Diagnosis Date  . Fibromyalgia   . Degenerative joint disease   . Hypertension   . Sleep apnea     does not use CPAP, uses oxygen 2L around the clock  . Cancer April 2011    Uterine Cancer  . Diabetes mellitus type II   . Lumbago   . Lack of coordination   . Disturbance   of skin sensation   . Chronic pain syndrome   . Pain in limb   . Spinal stenosis, lumbar region, without neurogenic claudication   . Enthesopathy of hip region   . Pain in joint, lower leg   . COPD (chronic obstructive pulmonary disease)   . Asthma   . Restrictive lung disease   . Depression     Past Surgical History  Procedure Date  . Abdominal hysterectomy   . Mouth surgery     Wisdom tooth extracted  . Dilation and curettage of uterus   . Orthroscopic surgery     Right knee  . Carpel tunnel release surgery     right    Family History  Problem Relation Age of Onset  . Bipolar disorder Brother   . Other Brother     PTSD  . Bipolar disorder Maternal Aunt   . Colon cancer Neg Hx   . Liver disease Neg Hx     History   Social History  . Marital Status: Single    Spouse Name: N/A    Number of  Children: 0  . Years of Education: N/A   Occupational History  . disability    Social History Main Topics  . Smoking status: Former Smoker -- 0.2 packs/day    Types: Cigarettes    Quit date: 01/02/2011  . Smokeless tobacco: Never Used  . Alcohol Use: No  . Drug Use: No  . Sexually Active: No   Other Topics Concern  . Not on file   Social History Narrative  . No narrative on file      ROS:  General: Negative for anorexia, weight loss, fever, chills, fatigue, weakness. Eyes: Negative for vision changes.  ENT: Negative for hoarseness,  nasal congestion. See HPI. CV: Negative for chest pain, angina, palpitations. Chronic dyspnea on exertion, peripheral edema.  Respiratory: Chronic dyspnea at rest, dyspnea on exertion. 2L oxygen constant. Denies cough, sputum, wheezing.  GI: See history of present illness. GU:  Negative for dysuria, hematuria, urinary incontinence, urinary frequency, nocturnal urination.  MS: Chronic joint pain, low back pain.  Derm: Negative for rash or itching.  Neuro: Negative for weakness, abnormal sensation, seizure, frequent headaches, memory loss, confusion.  Psych: Negative for anxiety, depression, suicidal ideation, hallucinations.  Endo: Negative for unusual weight change.  Heme: Negative for bruising or bleeding. Allergy: Negative for rash or hives.    Physical Examination:  Ht 5' 2" (1.575 m)  Wt 373 lb 9.6 oz (169.464 kg)  BMI 68.33 kg/m2   General: Morbidly obese AA female, well-developed in no acute distress. Accompanied by mother.  Head: Normocephalic, atraumatic.   Eyes: Conjunctiva pink, no icterus. Mouth: Oropharyngeal mucosa moist and pink , no lesions erythema or exudate. Neck: Supple without thyromegaly, masses, or lymphadenopathy.  Lungs: Clear to auscultation bilaterally.  Heart: Regular rate and rhythm, no murmurs rubs or gallops.  Abdomen: Bowel sounds are normal, mild diffuse upper abdominal and lower abdominal tenderness  but mostly in epigastrium, no hepatosplenomegaly or masses, no abdominal bruits or hernia , no rebound or guarding. Exam limited due to body habitus.    Rectal: not performed Extremities:1+lower extremity edema bilaterally. No clubbing or deformities.  Neuro: Alert and oriented x 4 , grossly normal neurologically.  Skin: Warm and dry, no rash or jaundice.   Psych: Alert and cooperative, normal mood and affect.  Labs: Lab Results  Component Value Date   CREATININE 0.80 04/14/2012   BUN 12 04/14/2012   NA 138   04/14/2012   K 4.2 04/14/2012   CL 98 04/14/2012   CO2 33* 04/14/2012   Lab Results  Component Value Date   ALT 31 04/14/2012   AST 24 04/14/2012   ALKPHOS 71 04/14/2012   BILITOT 0.4 04/14/2012   Lab Results  Component Value Date   WBC 7.5 04/14/2012   HGB 12.0 04/14/2012   HCT 37.3 04/14/2012   MCV 83.8 04/14/2012   PLT 285 04/14/2012   Lab Results  Component Value Date   LIPASE 31 04/14/2012   Lipase was 122 on 04/09/12.   Imaging Studies: Us Abdomen Complete  04/14/2012  *RADIOLOGY REPORT*  Clinical Data:  Right upper quadrant pain.  Diabetic.  COMPLETE ABDOMINAL ULTRASOUND  Comparison:  04/09/2012 CT.  Findings:  Gallbladder:  No gallstones, gallbladder wall thickening, or pericholecystic fluid.  Common bile duct:  3.9 mm.  Liver:  Mild fatty infiltration without focal mass.  IVC:  Appears normal.  Pancreas:  Poorly delineated secondary to bowel gas.  Spleen:  9.5 cm.  No focal mass.  Right Kidney:  12.9 cm. No hydronephrosis or renal mass.  Left Kidney:  11.8 cm. No hydronephrosis or renal mass.  Abdominal aorta:  Mid and distal aspect not visualized secondary to bowel gas.  Proximal aspect without abdominal aortic aneurysm.  IMPRESSION: No gallstones or findings of cholecystitis.  Mild fatty infiltration of the liver.  Limited evaluation of the pancreas and abdominal aorta secondary to bowel gas and patient's habitus.   Original Report Authenticated By: STEVEN R.  OLSON, M.D.    Ct Abdomen Pelvis W Contrast  04/09/2012  *RADIOLOGY REPORT*  Clinical Data: Pain  CT ABDOMEN AND PELVIS WITH CONTRAST  Technique:  Multidetector CT imaging of the abdomen and pelvis was performed following the standard protocol during bolus administration of intravenous contrast.  Contrast: 100mL OMNIPAQUE IOHEXOL 300 MG/ML  SOLN  Comparison: 10/18/2009  Findings: The lung bases are clear.  No pericardial or pleural effusion.  No focal liver abnormalities.  The gallbladder is normal.  No biliary dilatation.  The pancreas is unremarkable. Normal appearance of the spleen.  Both adrenal glands are normal.  The right kidney is normal.  The left kidney is normal.  Normal appearance of the urinary bladder. No enlarged upper abdominal lymph nodes.  There is no pelvic or inguinal adenopathy identified.  The stomach is normal.  The small bowel loops are normal in caliber.  Malrotation deformity is identified without evidence for mid gut volvulus.  The cecum is noted within the left lower quadrant of the abdomen, image 54.  The appendix is visualized and appears within normal limits.  The colon has a normal caliber.  No free fluid or fluid collections noted within the abdomen or pelvis.  Review visualized osseous structures are significant for degenerative disc disease within the thoracic and lumbar spine.  IMPRESSION:  1.  No acute findings within the abdomen or pelvis. 2.  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  No evidence for mid gut volvulus.   Original Report Authenticated By: TAYLOR H. STROUD, M.D.    Nm Hepato W/eject Fract  04/22/2012  *RADIOLOGY REPORT*  Clinical Data:  Right upper quadrant pain  NUCLEAR MEDICINE HEPATOBILIARY IMAGING WITH GALLBLADDER EF:  Technique: Sequential images of the abdomen were obtained for 60 minutes following intravenous administration of radiopharmaceutical.  Patient then ingested 8 ounces of commercially available  Ensure Plus and imaging was continued for 60 minutes.    A time-activity curve was generated from tracer within the gallbladder following Ensure Plus ingestion, and the gallbladder ejection fraction was calculated.  Radiopharmaceutical:  5.5 mCi Tc-99m mebrofenin; initial dose infiltrated at injection site at right antecubital fossa; repeat injection of 5.0 mCi.  Comparison: None  Findings:  Prompt tracer extraction from bloodstream, indicating normal hepatocellular function. Prompt excretion of tracer into biliary tree. Gallbladder visualized at 12 minutes. Small bowel and gastric tracer visualized beginning at 28 minutes. No hepatic retention of tracer.  Subjectively normal emptying of tracer from gallbladder following fatty meal stimulation. Calculated gallbladder ejection fraction is 50%, normal. Patient increased experienced right upper quadrant pain following fatty meal ingestion.  IMPRESSION: Normal exam as above.  Normal values for gallbladder ejection fraction: > 30% for exams utilizing sincalide (CCK) > 33% for exams utilizing fatty meal stimulation with Ensure Plus   Original Report Authenticated By: Mark Boles, M.D.    Dg Abd Acute W/chest  04/09/2012  *RADIOLOGY REPORT*  Clinical Data: Epigastric pain and chest pain  ACUTE ABDOMEN SERIES (ABDOMEN 2 VIEW & CHEST 1 VIEW)  Comparison: 08/13/2011  Findings: The heart size and mediastinal contours are within normal limits.  Both lungs are clear.  The visualized skeletal structures are unremarkable.  The bowel gas pattern is nonspecific.  Within the right lower quadrant of the abdomen there is an air-filled loop of small bowel which measures up to 3.2 mm.  Gas is noted within the colon.  No free intraperitoneal air noted.  IMPRESSION:  1.  Non specific bowel gas pattern. 2.  Right lower quadrant small bowel loops are mildly dilated and may represent focal ileus or partial obstruction.   Original Report Authenticated By: TAYLOR H. STROUD, M.D.       

## 2012-04-29 NOTE — Patient Instructions (Addendum)
We have scheduled you for an upper endoscopy with Dr. Jena Gauss. Please see separate instructions. When you finish your protonix (pantoprazole), please start omeprazole 20 mg daily before breakfast. Prescription has been sent to [facility].

## 2012-05-04 ENCOUNTER — Encounter (HOSPITAL_COMMUNITY): Payer: Self-pay | Admitting: Pharmacy Technician

## 2012-05-10 ENCOUNTER — Other Ambulatory Visit: Payer: Self-pay

## 2012-05-10 ENCOUNTER — Encounter (HOSPITAL_COMMUNITY): Payer: Self-pay | Admitting: Emergency Medicine

## 2012-05-10 ENCOUNTER — Emergency Department (HOSPITAL_COMMUNITY)
Admission: EM | Admit: 2012-05-10 | Discharge: 2012-05-10 | Disposition: A | Discharge status: Home / Self Care | Payer: Medicare Other | Attending: Emergency Medicine | Admitting: Emergency Medicine

## 2012-05-10 ENCOUNTER — Encounter (HOSPITAL_COMMUNITY): Payer: Self-pay | Admitting: *Deleted

## 2012-05-10 ENCOUNTER — Encounter (HOSPITAL_COMMUNITY)
Admission: RE | Disposition: A | Discharge status: Still In Hospital | Payer: Self-pay | Source: Ambulatory Visit | Attending: Internal Medicine

## 2012-05-10 ENCOUNTER — Ambulatory Visit (HOSPITAL_COMMUNITY)
Admission: RE | Admit: 2012-05-10 | Discharge: 2012-05-10 | Disposition: A | Discharge status: Still In Hospital | Payer: Medicare Other | Source: Ambulatory Visit | Attending: Internal Medicine | Admitting: Internal Medicine

## 2012-05-10 ENCOUNTER — Emergency Department (HOSPITAL_COMMUNITY): Payer: Medicare Other

## 2012-05-10 DIAGNOSIS — R1310 Dysphagia, unspecified: Secondary | ICD-10-CM | POA: Insufficient documentation

## 2012-05-10 DIAGNOSIS — IMO0001 Reserved for inherently not codable concepts without codable children: Secondary | ICD-10-CM | POA: Insufficient documentation

## 2012-05-10 DIAGNOSIS — G8929 Other chronic pain: Secondary | ICD-10-CM | POA: Insufficient documentation

## 2012-05-10 DIAGNOSIS — K209 Esophagitis, unspecified without bleeding: Secondary | ICD-10-CM | POA: Insufficient documentation

## 2012-05-10 DIAGNOSIS — F3289 Other specified depressive episodes: Secondary | ICD-10-CM | POA: Insufficient documentation

## 2012-05-10 DIAGNOSIS — Z8709 Personal history of other diseases of the respiratory system: Secondary | ICD-10-CM | POA: Insufficient documentation

## 2012-05-10 DIAGNOSIS — J45901 Unspecified asthma with (acute) exacerbation: Secondary | ICD-10-CM | POA: Insufficient documentation

## 2012-05-10 DIAGNOSIS — I10 Essential (primary) hypertension: Secondary | ICD-10-CM | POA: Insufficient documentation

## 2012-05-10 DIAGNOSIS — Z87891 Personal history of nicotine dependence: Secondary | ICD-10-CM | POA: Insufficient documentation

## 2012-05-10 DIAGNOSIS — K208 Other esophagitis: Secondary | ICD-10-CM

## 2012-05-10 DIAGNOSIS — F329 Major depressive disorder, single episode, unspecified: Secondary | ICD-10-CM | POA: Insufficient documentation

## 2012-05-10 DIAGNOSIS — R0789 Other chest pain: Secondary | ICD-10-CM

## 2012-05-10 DIAGNOSIS — Z8739 Personal history of other diseases of the musculoskeletal system and connective tissue: Secondary | ICD-10-CM | POA: Insufficient documentation

## 2012-05-10 DIAGNOSIS — G4730 Sleep apnea, unspecified: Secondary | ICD-10-CM | POA: Insufficient documentation

## 2012-05-10 DIAGNOSIS — J45909 Unspecified asthma, uncomplicated: Secondary | ICD-10-CM | POA: Insufficient documentation

## 2012-05-10 DIAGNOSIS — K219 Gastro-esophageal reflux disease without esophagitis: Secondary | ICD-10-CM

## 2012-05-10 DIAGNOSIS — R071 Chest pain on breathing: Secondary | ICD-10-CM | POA: Insufficient documentation

## 2012-05-10 DIAGNOSIS — Z8559 Personal history of malignant neoplasm of other urinary tract organ: Secondary | ICD-10-CM | POA: Insufficient documentation

## 2012-05-10 DIAGNOSIS — E119 Type 2 diabetes mellitus without complications: Secondary | ICD-10-CM | POA: Insufficient documentation

## 2012-05-10 DIAGNOSIS — R1013 Epigastric pain: Secondary | ICD-10-CM

## 2012-05-10 DIAGNOSIS — Z79899 Other long term (current) drug therapy: Secondary | ICD-10-CM | POA: Insufficient documentation

## 2012-05-10 HISTORY — DX: Shortness of breath: R06.02

## 2012-05-10 HISTORY — PX: ESOPHAGOGASTRODUODENOSCOPY (EGD) WITH ESOPHAGEAL DILATION: SHX5812

## 2012-05-10 LAB — COMPREHENSIVE METABOLIC PANEL
Albumin: 3.5 g/dL (ref 3.5–5.2)
Alkaline Phosphatase: 70 U/L (ref 39–117)
BUN: 13 mg/dL (ref 6–23)
CO2: 34 mEq/L — ABNORMAL HIGH (ref 19–32)
Chloride: 94 mEq/L — ABNORMAL LOW (ref 96–112)
Creatinine, Ser: 1.05 mg/dL (ref 0.50–1.10)
GFR calc Af Amer: 80 mL/min — ABNORMAL LOW (ref >90)
GFR calc non Af Amer: 69 mL/min — ABNORMAL LOW (ref >90)
Glucose, Bld: 113 mg/dL — ABNORMAL HIGH (ref 70–99)
Potassium: 4.5 mEq/L (ref 3.5–5.1)
Total Bilirubin: 0.6 mg/dL (ref 0.3–1.2)

## 2012-05-10 LAB — LIPASE, BLOOD: Lipase: 22 U/L (ref 11–59)

## 2012-05-10 LAB — GLUCOSE, CAPILLARY: Glucose-Capillary: 122 mg/dL — ABNORMAL HIGH (ref 70–99)

## 2012-05-10 LAB — CBC WITH DIFFERENTIAL/PLATELET
HCT: 36.8 % (ref 36.0–46.0)
Hemoglobin: 11.8 g/dL — ABNORMAL LOW (ref 12.0–15.0)
Lymphocytes Relative: 34 % (ref 12–46)
Lymphs Abs: 2.4 10*3/uL (ref 0.7–4.0)
MCHC: 32.1 g/dL (ref 30.0–36.0)
Monocytes Absolute: 0.4 10*3/uL (ref 0.1–1.0)
Monocytes Relative: 6 % (ref 3–12)
Neutro Abs: 4.1 10*3/uL (ref 1.7–7.7)
Neutrophils Relative %: 58 % (ref 43–77)
RBC: 4.34 MIL/uL (ref 3.87–5.11)

## 2012-05-10 LAB — POCT I-STAT TROPONIN I: Troponin i, poc: 0.01 ng/mL (ref 0.00–0.08)

## 2012-05-10 SURGERY — ESOPHAGOGASTRODUODENOSCOPY (EGD) WITH ESOPHAGEAL DILATION
Anesthesia: Moderate Sedation

## 2012-05-10 MED ORDER — SODIUM CHLORIDE 0.9 % IJ SOLN
INTRAMUSCULAR | Status: AC
  Filled 2012-05-10: qty 10

## 2012-05-10 MED ORDER — ONDANSETRON HCL 4 MG/2ML IJ SOLN
4.0000 mg | Freq: Once | INTRAMUSCULAR | Status: AC
  Administered 2012-05-10: 4 mg via INTRAVENOUS
  Filled 2012-05-10: qty 2

## 2012-05-10 MED ORDER — ONDANSETRON HCL 4 MG/2ML IJ SOLN
4.0000 mg | Freq: Once | INTRAMUSCULAR | Status: AC
  Administered 2012-05-10: 4 mg via INTRAVENOUS

## 2012-05-10 MED ORDER — PROMETHAZINE HCL 25 MG/ML IJ SOLN: 25.0000 mg | Freq: Once | INTRAMUSCULAR | Status: DC

## 2012-05-10 MED ORDER — MIDAZOLAM HCL 5 MG/5ML IJ SOLN
INTRAMUSCULAR | Status: AC
  Filled 2012-05-10: qty 10

## 2012-05-10 MED ORDER — MIDAZOLAM HCL 5 MG/5ML IJ SOLN
INTRAMUSCULAR | Status: DC | PRN
  Administered 2012-05-10 (×2): 1 mg via INTRAVENOUS

## 2012-05-10 MED ORDER — STERILE WATER FOR IRRIGATION IR SOLN
Status: DC | PRN
  Administered 2012-05-10: 14:00:00

## 2012-05-10 MED ORDER — ONDANSETRON HCL 4 MG/2ML IJ SOLN
INTRAMUSCULAR | Status: AC
  Administered 2012-05-10: 4 mg via INTRAVENOUS
  Filled 2012-05-10: qty 2

## 2012-05-10 MED ORDER — PROMETHAZINE HCL 25 MG/ML IJ SOLN
INTRAMUSCULAR | Status: AC
  Filled 2012-05-10: qty 1

## 2012-05-10 MED ORDER — MEPERIDINE HCL 100 MG/ML IJ SOLN
INTRAMUSCULAR | Status: DC | PRN
  Administered 2012-05-10 (×2): 25 mg via INTRAVENOUS

## 2012-05-10 MED ORDER — BUTAMBEN-TETRACAINE-BENZOCAINE 2-2-14 % EX AERO
INHALATION_SPRAY | CUTANEOUS | Status: DC | PRN
  Administered 2012-05-10: 2 via TOPICAL

## 2012-05-10 MED ORDER — MEPERIDINE HCL 100 MG/ML IJ SOLN
INTRAMUSCULAR | Status: AC
  Filled 2012-05-10: qty 1

## 2012-05-10 MED ORDER — SODIUM CHLORIDE 0.45 % IV SOLN
INTRAVENOUS | Status: DC
  Administered 2012-05-10: 14:00:00 via INTRAVENOUS

## 2012-05-10 MED ORDER — HYDROMORPHONE HCL PF 1 MG/ML IJ SOLN
1.0000 mg | Freq: Once | INTRAMUSCULAR | Status: AC
  Administered 2012-05-10: 1 mg via INTRAVENOUS
  Filled 2012-05-10: qty 1

## 2012-05-10 NOTE — ED Notes (Signed)
Pt arrived by wheelchair from pacu, (pt had endo today) she began having cp post procedure. Stated she has it all the time and has been worked up, her pmd told her it was not cardiac.  Pt alert and able to answer all ?'s.   md to bedside for exam. Pt on all monitors.

## 2012-05-10 NOTE — ED Notes (Signed)
Pt c/o cp while in pacu after endoscopy today

## 2012-05-10 NOTE — ED Notes (Addendum)
Patient states she continues to have nausea and chest pain, MD is aware, no new orders received and pt is aware of same.

## 2012-05-10 NOTE — OR Nursing (Signed)
Dr. Jena Gauss notified of patient having increased shortness of breath with getting undressed. Will hold Phenergan prior to procedure.

## 2012-05-10 NOTE — Progress Notes (Signed)
1550  Pt c/o mid sternal chest pain. No other pain noted.  VSS.  BP 108/80 HR 82 RR 20 Sats 100%.  Pt has nitroglycerin tablets and patches at home and uses it prn.  Dr Jena Gauss notified of chest pain.  Orders given to transfer pt  to ER Triage.  Pt transferred to ER triage.

## 2012-05-10 NOTE — ED Notes (Signed)
Gave patient sprite to drink at RN request

## 2012-05-10 NOTE — ED Provider Notes (Signed)
History   This chart was scribed for Hurman Horn, MD by Charolett Bumpers, ED Scribe. The patient was seen in room APA06/AP[facility]06. Patient's care was started at 1610.   CSN: [id number]  Arrival date & time 05/10/12  1549  First MD Initiated Contact with Patient 05/10/2012 1610.    Chief Complaint  Patient presents with  . Chest Pain    The history is provided by the patient. No language interpreter was used.   [name] is a 31 y.o. female who presents to the Emergency Department complaining of sudden onset, constant, sharp, stabbing mid-sternal chest pain that started an hour ago after having an endoscopy. She also describes the pain as a pressure and states it is typical for her chronic pain. She states that she has chronic generalized pain from fibromyalgia. She states that she gets this type of pain twice weekly, lasting hours to days at a time. She states the chest pain is worse with some positions and improves with other positions. She states the chest pain radiates into her neck and arm in the past, but is non-radiating today. She states the chest pain is not aggravated with exertion and is non-pleuritic. She denies any fever, cough, changes in her abdominal pain, confusion, syncope, vomiting or leg swelling. She also has a h/o DM, asthma, COPD and is on O2 at home. She denies any h/o PE or DVT. She denies any cardiac hx. She is not a smoker. She ambulates with a walker and uses a wheelchair at baseline.  GI: Dr. Jena Gauss Cardiologist: Dr. Dietrich Pates  Past Medical History  Diagnosis Date  . Fibromyalgia   . Degenerative joint disease   . Hypertension   . Sleep apnea     does not use CPAP, uses oxygen 2L around the clock  . Cancer April 2011    Uterine Cancer  . Diabetes mellitus type II   . Lumbago   . Lack of coordination   . Disturbance of skin sensation   . Chronic pain syndrome   . Pain in limb   . Spinal stenosis, lumbar region, without neurogenic claudication     . Enthesopathy of hip region   . Pain in joint, lower leg   . COPD (chronic obstructive pulmonary disease)   . Asthma   . Restrictive lung disease   . Depression   . Shortness of breath     Past Surgical History  Procedure Date  . Abdominal hysterectomy   . Mouth surgery     Wisdom tooth extracted  . Dilation and curettage of uterus   . Orthroscopic surgery     Right knee  . Carpel tunnel release surgery     right  . Esophagogastroduodenoscopy (egd) with esophageal dilation 05/10/2012    Procedure: ESOPHAGOGASTRODUODENOSCOPY (EGD) WITH ESOPHAGEAL DILATION;  Surgeon: Corbin Ade, MD;  Location: [facility];  Service: Endoscopy;  Laterality: N/A;  2:45/GIVE PHENERGAN 25MG  IV 30 MINS PRIOR TO PROCEDURE    Family History  Problem Relation Age of Onset  . Bipolar disorder Brother   . Other Brother     PTSD  . Bipolar disorder Maternal Aunt   . Colon cancer Neg Hx   . Liver disease Neg Hx     History  Substance Use Topics  . Smoking status: Former Smoker -- 0.2 packs/day    Types: Cigarettes    Quit date: 01/02/2011  . Smokeless tobacco: Never Used  . Alcohol Use: No    OB History  Grav Para Term Preterm Abortions TAB SAB Ect Mult Living                  Review of Systems A complete 10 system review of systems was obtained and all systems are negative except as noted in the HPI and PMH.   Allergies  Avocado; Flexeril; Marflex; Tramadol; Codeine; Cyclobenzaprine; and Other  Home Medications   Current Outpatient Rx  Name  Route  Sig  Dispense  Refill  . ALBUTEROL SULFATE HFA 108 (90 BASE) MCG/ACT IN AERS   Inhalation   Inhale 2 puffs into the lungs every 6 (six) hours as needed. For rescue         . ARIPIPRAZOLE 30 MG PO TABS   Oral   Take 30 mg by mouth at bedtime.         . ATORVASTATIN CALCIUM 80 MG PO TABS   Oral   Take 80 mg by mouth every evening.          . BUDESONIDE-FORMOTEROL FUMARATE 160-4.5 MCG/ACT IN AERO   Inhalation    Inhale 2 puffs into the lungs 2 (two) times daily.         Marland Kitchen DETROL LA 4 MG PO CP24   Oral   Take 4 mg by mouth every morning.          Marland Kitchen ESTROGENS CONJUGATED 0.45 MG PO TABS   Oral   Take 0.45 mg by mouth every morning.         Marland Kitchen EXENATIDE 5 MCG/0.02ML  SOLN   Subcutaneous   Inject into the skin 2 (two) times daily with a meal.         . FLUOXETINE HCL 40 MG PO CAPS   Oral   Take 1 capsule (40 mg total) by mouth every evening.   30 capsule   2   . HYDROCODONE-ACETAMINOPHEN 7.5-325 MG PO TABS   Oral   Take 0.5 tablets by mouth 2 (two) times daily.   30 tablet   0   . HYDROXYZINE PAMOATE 25 MG PO CAPS   Oral   Take 1 capsule (25 mg total) by mouth 2 (two) times daily.   60 capsule   2   . KETOCONAZOLE 2 % EX CREA   Topical   Apply 1 application topically daily as needed.         Marland Kitchen LISINOPRIL 10 MG PO TABS   Oral   Take 10 mg by mouth every morning.          Marland Kitchen METOPROLOL TARTRATE 50 MG PO TABS   Oral   Take 50 mg by mouth 2 (two) times daily.           . NYSTATIN 100000 UNIT/GM EX POWD   Topical   Apply 1 application topically Daily. Applied to folds and under breasts as needed for irritation and rash         . PANTOPRAZOLE SODIUM 40 MG PO TBEC   Oral   Take 40 mg by mouth every morning. *4 days remaining, then to take Omeprazole 20mg  daily in the morning following completion of Pantoprazole 40mg *         . PREGABALIN 75 MG PO CAPS   Oral   Take 1 capsule (75 mg total) by mouth 2 (two) times daily.   60 capsule   1   . DICLOFENAC SODIUM 1 % TD GEL   Topical   Apply 2 g topically daily as needed. Knee Pain         .  OMEPRAZOLE 20 MG PO CPDR   Oral   Take 1 capsule (20 mg total) by mouth daily before breakfast. Start when you run out of protonix   30 capsule   11     BP 126/64  Pulse 91  Temp 98.2 F (36.8 C)  Resp 12  Ht 5\' 2"  (1.575 m)  Wt 374 lb (169.645 kg)  BMI 68.41 kg/m2  SpO2 97%  Physical Exam  Nursing note  and vitals reviewed. Constitutional:       Awake, alert, nontoxic appearance. Morbidly obese.   HENT:  Head: Atraumatic.  Eyes: Right eye exhibits no discharge. Left eye exhibits no discharge.  Neck: Neck supple.  Cardiovascular: Normal rate, regular rhythm and normal heart sounds.   No murmur heard. Pulmonary/Chest: Effort normal and breath sounds normal. No respiratory distress. She has no wheezes. She has no rales. She exhibits tenderness.       Chest wall is diffusely tender that reproduces the chest pain. No rash on chest wall  Abdominal: Soft. Bowel sounds are normal. There is tenderness. There is no rebound.       Mild epigastric tenderness but no rebound.   Musculoskeletal: She exhibits no edema and no tenderness.       Baseline ROM, no obvious new focal weakness.  Neurological:       Mental status and motor strength appears baseline for patient and situation.  Skin: No rash noted.  Psychiatric: She has a normal mood and affect.    ED Course  Procedures (including critical care time) ECG: Sinus rhythm, ventricular rate 73, normal axis, normal intervals, no acute ischemic changes noted, impression normal ECG, compared to 04/09/2012 patient no longer has nonspecific inferior ST changes DIAGNOSTIC STUDIES: Oxygen Saturation is 100% on room air, normal by my interpretation.    COORDINATION OF CARE:  16:20-Patient / Family / Caregiver understand and agree with initial ED impression and plan with expectations set for ED visit.  Pt stable, pain unchanged, ordered per Pt request Zofran and Dilaudid which Pt states she usually gets in ED for her pains.1950  9:31: Patient / Family / Caregiver informed of clinical course, understand medical decision-making process, and agree with plan.    Results for orders placed during the hospital encounter of 05/10/12  CBC WITH DIFFERENTIAL      Component Value Range   WBC 7.1  4.0 - 10.5 K/uL   RBC 4.34  3.87 - 5.11 MIL/uL   Hemoglobin  11.8 (*) 12.0 - 15.0 g/dL   HCT 16.1  09.6 - 04.5 %   MCV 84.8  78.0 - 100.0 fL   MCH 27.2  26.0 - 34.0 pg   MCHC 32.1  30.0 - 36.0 g/dL   RDW 40.9  81.1 - 91.4 %   Platelets 311  150 - 400 K/uL   Neutrophils Relative 58  43 - 77 %   Neutro Abs 4.1  1.7 - 7.7 K/uL   Lymphocytes Relative 34  12 - 46 %   Lymphs Abs 2.4  0.7 - 4.0 K/uL   Monocytes Relative 6  3 - 12 %   Monocytes Absolute 0.4  0.1 - 1.0 K/uL   Eosinophils Relative 2  0 - 5 %   Eosinophils Absolute 0.1  0.0 - 0.7 K/uL   Basophils Relative 0  0 - 1 %   Basophils Absolute 0.0  0.0 - 0.1 K/uL  COMPREHENSIVE METABOLIC PANEL      Component Value Range   Sodium  135  135 - 145 mEq/L   Potassium 4.5  3.5 - 5.1 mEq/L   Chloride 94 (*) 96 - 112 mEq/L   CO2 34 (*) 19 - 32 mEq/L   Glucose, Bld 113 (*) 70 - 99 mg/dL   BUN 13  6 - 23 mg/dL   Creatinine, Ser 1.61  0.50 - 1.10 mg/dL   Calcium 9.6  8.4 - 09.6 mg/dL   Total Protein 7.1  6.0 - 8.3 g/dL   Albumin 3.5  3.5 - 5.2 g/dL   AST 16  0 - 37 U/L   ALT 17  0 - 35 U/L   Alkaline Phosphatase 70  39 - 117 U/L   Total Bilirubin 0.6  0.3 - 1.2 mg/dL   GFR calc non Af Amer 69 (*) >90 mL/min   GFR calc Af Amer 80 (*) >90 mL/min  LIPASE, BLOOD      Component Value Range   Lipase 22  11 - 59 U/L  POCT I-STAT TROPONIN I      Component Value Range   Troponin i, poc 0.01  0.00 - 0.08 ng/mL   Comment 3           POCT I-STAT TROPONIN I      Component Value Range   Troponin i, poc 0.07  0.00 - 0.08 ng/mL   Comment 3           POCT I-STAT TROPONIN I      Component Value Range   Troponin i, poc 0.00  0.00 - 0.08 ng/mL   Comment 3             No results found.   1. Chest wall pain       MDM    I personally performed the services described in this documentation, which was scribed in my presence. The recorded information has been reviewed and is accurate.  Pt stable in ED with no significant deterioration in condition.  Patient / Family / Caregiver informed of clinical  course, understand medical decision-making process, and agree with plan.  I doubt any other EMC precluding discharge at this time including, but not necessarily limited to the following:ACS, PE.      Hurman Horn, MD 05/13/12 [phone]

## 2012-05-10 NOTE — Op Note (Signed)
[facility] [address] [place], [address]   ENDOSCOPY PROCEDURE REPORT  PATIENT: [name], [name]  MR#: [id number] BIRTHDATE: 01/02/1981 , 31  yrs. old GENDER: Female ENDOSCOPIST: R.  Roetta Sessions, MD FACP FACG REFERRED BY:  Carylon Perches, M.D. PROCEDURE DATE:  05/10/2012 PROCEDURE:     EGD with Elease Hashimoto dilation  INDICATIONS:    Recent odynophagia/dysphagia in the setting of GERD; symptoms have steadily improved since starting acid suppression therapy in way of Protonix recently.  INFORMED CONSENT:   The risks, benefits, limitations, alternatives and imponderables have been discussed.  The potential for biopsy, esophogeal dilation, etc. have also been reviewed.  Questions have been answered.  All parties agreeable.  Please see the history and physical in the medical record for more information.  MEDICATIONS:   Versed 2 mg IV and Demerol 50 mg IV in divided doses. Cetacaine spray. No therapy given.  DESCRIPTION OF PROCEDURE:   The EG-[id number] ([id number])  endoscope was introduced through the mouth and advanced to the second portion of the duodenum without difficulty or limitations.  The mucosal surfaces were surveyed very carefully during advancement of the scope and upon withdrawal.  Retroflexion view of the proximal stomach and esophagogastric junction was performed.      FINDINGS:   "strips" of hard mucous-like material lining the distal esophagus consistent with esophagitis dissecans. It fairly easily rubbed off with the scope. Underlying mucosa appeared normal. Tubular esophagus patent throughout its course. Stomach empty. Small hiatal hernia. Gastric mucosa appeared normal. Patent pylorus. First and second portion of the duodenum appeared normal.  THERAPEUTIC / DIAGNOSTIC MANEUVERS PERFORMED:  A 54 French Maloney dilator was passed to full insertion easily. A look back revealed no apparent complication with this maneuver.  COMPLICATIONS:   None  IMPRESSION:  Esophagitis dissecans-nonspecific, seen with GERD and pill-induced esophageal injury; status post passage of a Maloney dilator empirically  RECOMMENDATIONS:  Continue acid suppression therapy in the way of omeprazole 20 mg orally daily. Swallowing precautions with medications reviewed. Office visit with [place] in 3 months.    _______________________________ R. Roetta Sessions, MD FACP [facility] eSigned:  R. Roetta Sessions, MD FACP [facility] 05/10/2012 2:22 PM     CC:

## 2012-05-10 NOTE — H&P (View-Only) (Signed)
Primary Care Physician:  Carylon Perches, MD  Primary Gastroenterologist:  Roetta Sessions, MD    Chief Complaint  Patient presents with  . Abdominal Pain    HPI:  [name] is a 31 y.o. female here for further evaluation of abdominal pain.  One month history of odynophagia, epigastric/upper abdominal pain. Seen in ED twice. Work up has included CT A/P, abd u/s, HIDA without explanation for pain. Protonix started after one of her EF visits and has helped odynophagia. Pain in epigastric and both upper quadrants persistent. Not necessarily worse with meals or positional. Some nausea but no vomiting. H/O nocturnal regurgitation but that is better on Protonix. Some dysphagia with meats, has to wash down. BM 3-4 times per day. No melena, brbpr.  Protonix is not covered by her insurance.    Current Outpatient Prescriptions  Medication Sig Dispense Refill  . albuterol (PROAIR HFA) 108 (90 BASE) MCG/ACT inhaler Inhale 2 puffs into the lungs every 6 (six) hours as needed. For rescue      . ARIPiprazole (ABILIFY) 30 MG tablet Take 30 mg by mouth at bedtime.      Marland Kitchen atorvastatin (LIPITOR) 80 MG tablet Take 80 mg by mouth every evening.       Marland Kitchen DETROL LA 4 MG 24 hr capsule Take 4 mg by mouth every morning.       . estrogens, conjugated, (PREMARIN) 0.45 MG tablet Take 0.45 mg by mouth every morning.      Marland Kitchen exenatide (BYETTA) 5 MCG/0.02ML SOLN Inject into the skin 2 (two) times daily with a meal.      . FLUoxetine (PROZAC) 40 MG capsule Take 1 capsule (40 mg total) by mouth every evening.  30 capsule  2  . HYDROcodone-acetaminophen (NORCO) 7.5-325 MG per tablet Take 0.5 tablets by mouth 2 (two) times daily.  30 tablet  0  . hydrOXYzine (VISTARIL) 25 MG capsule Take 1 capsule (25 mg total) by mouth 2 (two) times daily.  60 capsule  2  . lisinopril (PRINIVIL,ZESTRIL) 10 MG tablet Take 10 mg by mouth every morning.       . metoprolol (LOPRESSOR) 50 MG tablet Take 50 mg by mouth 2 (two) times daily.        .  nitroGLYCERIN (NITROSTAT) 0.4 MG SL tablet Place 0.4 mg under the tongue every 5 (five) minutes as needed.      . nystatin (MYCOSTATIN) powder Apply 1 application topically Daily. Applied to folds and under breasts as needed for irritation and rash      . pantoprazole (PROTONIX) 20 MG tablet Take 1 tablet (20 mg total) by mouth daily.  30 tablet  0  . pregabalin (LYRICA) 75 MG capsule Take 1 capsule (75 mg total) by mouth 2 (two) times daily.  60 capsule  1    Allergies as of 04/29/2012 - Review Complete 04/29/2012  Allergen Reaction Noted  . Avocado Anaphylaxis 07/08/2011  . Flexeril (cyclobenzaprine hcl) Anaphylaxis 07/08/2011  . Marflex (orphenadrine citrate) Anaphylaxis 05/03/2011  . Tramadol Itching 05/03/2011  . Codeine Itching 02/20/2009  . Cyclobenzaprine  04/22/2012    Past Medical History  Diagnosis Date  . Fibromyalgia   . Degenerative joint disease   . Hypertension   . Sleep apnea     does not use CPAP, uses oxygen 2L around the clock  . Cancer April 2011    Uterine Cancer  . Diabetes mellitus type II   . Lumbago   . Lack of coordination   . Disturbance  of skin sensation   . Chronic pain syndrome   . Pain in limb   . Spinal stenosis, lumbar region, without neurogenic claudication   . Enthesopathy of hip region   . Pain in joint, lower leg   . COPD (chronic obstructive pulmonary disease)   . Asthma   . Restrictive lung disease   . Depression     Past Surgical History  Procedure Date  . Abdominal hysterectomy   . Mouth surgery     Wisdom tooth extracted  . Dilation and curettage of uterus   . Orthroscopic surgery     Right knee  . Carpel tunnel release surgery     right    Family History  Problem Relation Age of Onset  . Bipolar disorder Brother   . Other Brother     PTSD  . Bipolar disorder Maternal Aunt   . Colon cancer Neg Hx   . Liver disease Neg Hx     History   Social History  . Marital Status: Single    Spouse Name: N/A    Number of  Children: 0  . Years of Education: N/A   Occupational History  . disability    Social History Main Topics  . Smoking status: Former Smoker -- 0.2 packs/day    Types: Cigarettes    Quit date: 01/02/2011  . Smokeless tobacco: Never Used  . Alcohol Use: No  . Drug Use: No  . Sexually Active: No   Other Topics Concern  . Not on file   Social History Narrative  . No narrative on file      ROS:  General: Negative for anorexia, weight loss, fever, chills, fatigue, weakness. Eyes: Negative for vision changes.  ENT: Negative for hoarseness,  nasal congestion. See HPI. CV: Negative for chest pain, angina, palpitations. Chronic dyspnea on exertion, peripheral edema.  Respiratory: Chronic dyspnea at rest, dyspnea on exertion. 2L oxygen constant. Denies cough, sputum, wheezing.  GI: See history of present illness. GU:  Negative for dysuria, hematuria, urinary incontinence, urinary frequency, nocturnal urination.  MS: Chronic joint pain, low back pain.  Derm: Negative for rash or itching.  Neuro: Negative for weakness, abnormal sensation, seizure, frequent headaches, memory loss, confusion.  Psych: Negative for anxiety, depression, suicidal ideation, hallucinations.  Endo: Negative for unusual weight change.  Heme: Negative for bruising or bleeding. Allergy: Negative for rash or hives.    Physical Examination:  Ht 5\' 2"  (1.575 m)  Wt 373 lb 9.6 oz (169.464 kg)  BMI 68.33 kg/m2   General: Morbidly obese AA female, well-developed in no acute distress. Accompanied by mother.  Head: Normocephalic, atraumatic.   Eyes: Conjunctiva pink, no icterus. Mouth: Oropharyngeal mucosa moist and pink , no lesions erythema or exudate. Neck: Supple without thyromegaly, masses, or lymphadenopathy.  Lungs: Clear to auscultation bilaterally.  Heart: Regular rate and rhythm, no murmurs rubs or gallops.  Abdomen: Bowel sounds are normal, mild diffuse upper abdominal and lower abdominal tenderness  but mostly in epigastrium, no hepatosplenomegaly or masses, no abdominal bruits or hernia , no rebound or guarding. Exam limited due to body habitus.    Rectal: not performed Extremities:1+lower extremity edema bilaterally. No clubbing or deformities.  Neuro: Alert and oriented x 4 , grossly normal neurologically.  Skin: Warm and dry, no rash or jaundice.   Psych: Alert and cooperative, normal mood and affect.  Labs: Lab Results  Component Value Date   CREATININE 0.80 04/14/2012   BUN 12 04/14/2012   NA 138  04/14/2012   K 4.2 04/14/2012   CL 98 04/14/2012   CO2 33* 04/14/2012   Lab Results  Component Value Date   ALT 31 04/14/2012   AST 24 04/14/2012   ALKPHOS 71 04/14/2012   BILITOT 0.4 04/14/2012   Lab Results  Component Value Date   WBC 7.5 04/14/2012   HGB 12.0 04/14/2012   HCT 37.3 04/14/2012   MCV 83.8 04/14/2012   PLT 285 04/14/2012   Lab Results  Component Value Date   LIPASE 31 04/14/2012   Lipase was 122 on 04/09/12.   Imaging Studies: US Abdomen Complete  04/14/2012  *RADIOLOGY REPORT*  Clinical Data:  Right upper quadrant pain.  Diabetic.  COMPLETE ABDOMINAL ULTRASOUND  Comparison:  04/09/2012 CT.  Findings:  Gallbladder:  No gallstones, gallbladder wall thickening, or pericholecystic fluid.  Common bile duct:  3.9 mm.  Liver:  Mild fatty infiltration without focal mass.  IVC:  Appears normal.  Pancreas:  Poorly delineated secondary to bowel gas.  Spleen:  9.5 cm.  No focal mass.  Right Kidney:  12.9 cm. No hydronephrosis or renal mass.  Left Kidney:  11.8 cm. No hydronephrosis or renal mass.  Abdominal aorta:  Mid and distal aspect not visualized secondary to bowel gas.  Proximal aspect without abdominal aortic aneurysm.  IMPRESSION: No gallstones or findings of cholecystitis.  Mild fatty infiltration of the liver.  Limited evaluation of the pancreas and abdominal aorta secondary to bowel gas and patient's habitus.   Original Report Authenticated By: Fuller Canada, M.D.    Ct Abdomen Pelvis W Contrast  04/09/2012  *RADIOLOGY REPORT*  Clinical Data: Pain  CT ABDOMEN AND PELVIS WITH CONTRAST  Technique:  Multidetector CT imaging of the abdomen and pelvis was performed following the standard protocol during bolus administration of intravenous contrast.  Contrast: OMNIPAQUE IOHEXOL 300 MG/ML  SOLN  Comparison: 10/18/2009  Findings: The lung bases are clear.  No pericardial or pleural effusion.  No focal liver abnormalities.  The gallbladder is normal.  No biliary dilatation.  The pancreas is unremarkable. Normal appearance of the spleen.  Both adrenal glands are normal.  The right kidney is normal.  The left kidney is normal.  Normal appearance of the urinary bladder. No enlarged upper abdominal lymph nodes.  There is no pelvic or inguinal adenopathy identified.  The stomach is normal.  The small bowel loops are normal in caliber.  Malrotation deformity is identified without evidence for mid gut volvulus.  The cecum is noted within the left lower quadrant of the abdomen, image 54.  The appendix is visualized and appears within normal limits.  The colon has a normal caliber.  No free fluid or fluid collections noted within the abdomen or pelvis.  Review visualized osseous structures are significant for degenerative disc disease within the thoracic and lumbar spine.  IMPRESSION:  1.  No acute findings within the abdomen or pelvis. 2.  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  No evidence for mid gut volvulus.   Original Report Authenticated By: Rosealee Albee, M.D.    Nm Hepato W/eject Fract  04/22/2012  *RADIOLOGY REPORT*  Clinical Data:  Right upper quadrant pain  NUCLEAR MEDICINE HEPATOBILIARY IMAGING WITH GALLBLADDER EF:  Technique: Sequential images of the abdomen were obtained for 60 minutes following intravenous administration of radiopharmaceutical.  Patient then ingested 8 ounces of commercially available  Ensure Plus and imaging was continued for 60 minutes.  A time-activity curve was generated from tracer within the gallbladder following Ensure Plus ingestion, and the gallbladder ejection fraction was calculated.  Radiopharmaceutical:  5.5 mCi Tc-63m mebrofenin; initial dose infiltrated at injection site at right antecubital fossa; repeat injection of 5.0 mCi.  Comparison: None  Findings:  Prompt tracer extraction from bloodstream, indicating normal hepatocellular function. Prompt excretion of tracer into biliary tree. Gallbladder visualized at 12 minutes. Small bowel and gastric tracer visualized beginning at 28 minutes. No hepatic retention of tracer.  Subjectively normal emptying of tracer from gallbladder following fatty meal stimulation. Calculated gallbladder ejection fraction is 50%, normal. Patient increased experienced right upper quadrant pain following fatty meal ingestion.  IMPRESSION: Normal exam as above.  Normal values for gallbladder ejection fraction: > 30% for exams utilizing sincalide (CCK) > 33% for exams utilizing fatty meal stimulation with Ensure Plus   Original Report Authenticated By: Ulyses Southward, M.D.    Dg Abd Acute W/chest  04/09/2012  *RADIOLOGY REPORT*  Clinical Data: Epigastric pain and chest pain  ACUTE ABDOMEN SERIES (ABDOMEN 2 VIEW & CHEST 1 VIEW)  Comparison: 08/13/2011  Findings: The heart size and mediastinal contours are within normal limits.  Both lungs are clear.  The visualized skeletal structures are unremarkable.  The bowel gas pattern is nonspecific.  Within the right lower quadrant of the abdomen there is an air-filled loop of small bowel which measures up to 3.2 mm.  Gas is noted within the colon.  No free intraperitoneal air noted.  IMPRESSION:  1.  Non specific bowel gas pattern. 2.  Right lower quadrant small bowel loops are mildly dilated and may represent focal ileus or partial obstruction.   Original Report Authenticated By: Rosealee Albee, M.D.

## 2012-05-10 NOTE — ED Notes (Signed)
Pt returned from xray. vss.

## 2012-05-10 NOTE — Interval H&P Note (Signed)
History and Physical Interval Note:  05/10/2012 1:49 PM  [name]  has presented today for surgery, with the diagnosis of ODYNOPHAGIA, DYSPHAGIA, EPIGASTRIC PAIN AND GERD  The various methods of treatment have been discussed with the patient and family. After consideration of risks, benefits and other options for treatment, the patient has consented to  Procedure(s) (LRB) with comments: ESOPHAGOGASTRODUODENOSCOPY (EGD) WITH ESOPHAGEAL DILATION (N/A) - 2:45/GIVE PHENERGAN 25MG  IV 30 MINS PRIOR TO PROCEDURE as a surgical intervention .  The patient's history has been reviewed, patient examined, no change in status, stable for surgery.  I have reviewed the patient's chart and labs.  Questions were answered to the patient's satisfaction.     Eula Listen  Patient seen and examined. Patient states odynophagia and dysphagia improved significantly since starting Protonix.  I elected not to give this lady Phenergan preoperatively given her multiple comorbidities. EGD with esophageal dilation now being performed per plan. The risks, benefits, limitations, alternatives and imponderables have been reviewed with the patient. Potential for esophageal dilation, biopsy, etc. have also been reviewed.  Questions have been answered. All parties agreeable.

## 2012-05-10 NOTE — ED Notes (Signed)
Pt repositioned self in bed, awaiting blood work

## 2012-05-10 NOTE — ED Notes (Signed)
Ambulatory in room without assist, able to dress self without difficulty.  Standby assist to wheelchair for discharge home.

## 2012-05-12 ENCOUNTER — Encounter (HOSPITAL_COMMUNITY): Payer: Self-pay | Admitting: Internal Medicine

## 2012-05-26 ENCOUNTER — Encounter: Payer: Medicare Other | Attending: Neurosurgery | Admitting: Physical Medicine and Rehabilitation

## 2012-05-26 ENCOUNTER — Encounter: Payer: Self-pay | Admitting: Physical Medicine and Rehabilitation

## 2012-05-26 VITALS — BP 150/82 | HR 86 | Resp 16 | Ht 62.0 in | Wt 384.0 lb

## 2012-05-26 DIAGNOSIS — R2689 Other abnormalities of gait and mobility: Secondary | ICD-10-CM

## 2012-05-26 DIAGNOSIS — IMO0001 Reserved for inherently not codable concepts without codable children: Secondary | ICD-10-CM

## 2012-05-26 DIAGNOSIS — M48061 Spinal stenosis, lumbar region without neurogenic claudication: Secondary | ICD-10-CM | POA: Insufficient documentation

## 2012-05-26 DIAGNOSIS — R269 Unspecified abnormalities of gait and mobility: Secondary | ICD-10-CM

## 2012-05-26 DIAGNOSIS — G8929 Other chronic pain: Secondary | ICD-10-CM

## 2012-05-26 DIAGNOSIS — Z5181 Encounter for therapeutic drug level monitoring: Secondary | ICD-10-CM

## 2012-05-26 DIAGNOSIS — M25569 Pain in unspecified knee: Secondary | ICD-10-CM | POA: Insufficient documentation

## 2012-05-26 MED ORDER — HYDROCODONE-ACETAMINOPHEN 7.5-325 MG PO TABS: 0.5000 | ORAL_TABLET | Freq: Two times a day (BID) | ORAL | Status: DC

## 2012-05-26 NOTE — Progress Notes (Signed)
Subjective:    Patient ID: [name], female    DOB: 07/16/1980, 31 y.o.   MRN: [id number]  HPIThe patient is a 31 year old woman who is here at her [facility] for chronic pain complaints are related to her low back in her lower extremities. She also has a history of fibromyalgia.  She has neurogenic claudication. She has pain in her lower extremities when she walks more than 2 minutes. She underwent lumbar epidural steroid injection 07/17/2011. She reports approximately 2 weeks of relief after the injection. Her ability to ambulate improved up to 5-10 minutes.   She continues to have multiple pain complaints which include low back pain, bilateral leg pain, bilateral knee pain, history of fibromyalgia.   She has recently had some problems with right upper quadrant abdominal pain and has undergone testing for this. She has had abdominal/pelvis CT with contrast 04/09/2012, abdominal/chest x-ray 10/25 2013, abdominal ultrasound 10/30 2013, 04/21/19/2013 nuclear medicine hepatobiliary imaging.  05/10/12 underwent EGD per Dr. Kendell Bane results noted.  She says overall she is doing better.  Her Right knee is bothering her more lately however.  She states the increased dose of lyrica is" helping a lot".  Current medication  7.5/325 one half tab bid from 5/325 twice a day.    She is here for a refill of her pain medication and medication monitering.     Pain Inventory Average Pain 6 Pain Right Now 6 My pain is sharp, burning, stabbing and aching  In the last 24 hours, has pain interfered with the following? General activity 9 Relation with others 9 Enjoyment of life 9 What TIME of day is your pain at its worst? all the time Sleep (in general) Fair  Pain is worse with: walking, bending, sitting and standing Pain improves with: heat/ice, medication, injections and massage Relief from Meds: 5  Mobility walk without assistance use a cane use a  walker how many minutes can you walk? 5 ability to climb steps?  yes do you drive?  no use a wheelchair transfers alone Do you have any goals in this area?  no  Function disabled: date disabled  I need assistance with the following:  dressing, bathing, toileting, meal prep, household duties and shopping Do you have any goals in this area?  no  Neuro/Psych No problems in this area  Prior Studies Any changes since last visit?  no  Physicians involved in your care Any changes since last visit?  no   Family History  Problem Relation Age of Onset  . Bipolar disorder Brother   . Other Brother     PTSD  . Bipolar disorder Maternal Aunt   . Colon cancer Neg Hx   . Liver disease Neg Hx    History   Social History  . Marital Status: Single    Spouse Name: N/A    Number of Children: 0  . Years of Education: N/A   Occupational History  . disability    Social History Main Topics  . Smoking status: Former Smoker -- 0.2 packs/day    Types: Cigarettes    Quit date: 01/02/2011  . Smokeless tobacco: Never Used  . Alcohol Use: No  . Drug Use: No  . Sexually Active: No   Other Topics Concern  . None   Social History Narrative  . None   Past Surgical History  Procedure Date  . Abdominal hysterectomy   . Mouth surgery     Wisdom tooth extracted  .  Dilation and curettage of uterus   . Orthroscopic surgery     Right knee  . Carpel tunnel release surgery     right  . Esophagogastroduodenoscopy (egd) with esophageal dilation 05/10/2012    Procedure: ESOPHAGOGASTRODUODENOSCOPY (EGD) WITH ESOPHAGEAL DILATION;  Surgeon: Corbin Ade, MD;  Location: [facility] SUITE;  Service: Endoscopy;  Laterality: N/A;  2:45/GIVE PHENERGAN 25MG  IV 30 MINS PRIOR TO PROCEDURE   Past Medical History  Diagnosis Date  . Fibromyalgia   . Degenerative joint disease   . Hypertension   . Sleep apnea     does not use CPAP, uses oxygen 2L around the clock  . Cancer April 2011    Uterine Cancer   . Diabetes mellitus type II   . Lumbago   . Lack of coordination   . Disturbance of skin sensation   . Chronic pain syndrome   . Pain in limb   . Spinal stenosis, lumbar region, without neurogenic claudication   . Enthesopathy of hip region   . Pain in joint, lower leg   . COPD (chronic obstructive pulmonary disease)   . Asthma   . Restrictive lung disease   . Depression   . Shortness of breath    BP 150/82  Pulse 86  Resp 16  Ht 5\' 2"  (1.575 m)  Wt 384 lb (174.181 kg)  BMI 70.23 kg/m2  SpO2 94%    Review of Systems  Musculoskeletal: Positive for back pain and gait problem.  All other systems reviewed and are negative.       Objective:   Physical Exam The patient does not appear in any distress. Smiling most of interview. She sat on the exam table without difficulty.  Patient is alert oriented cooperative pleasant follows commands without difficulty answers questions appropriately  Cranial nerves and coordination are intact  Reflexes are diminished below the knees   Decreased sensation as noted below the knees especially over the dorsum of the right foot.    Motor strength is good and the lower extremities bilaterally 5 over 5 and hip flexors and extensors dorsiflexors plantar flexors.  Straight leg raise is negative  Transition slowly from sitting to standing.  Gait is slightly wide based with short stride length. She uses a cane for ambulation.   Difficulty with tandem gait. Right lower extremity is externally rotated during stance phase.  Full range of motion is noted at the knees. Effusion is not appreciated. Left medial joint line tenderness is noted.  There's no crepitus noted today with flexion and extension at the knees.  Lumbar motion is limited in all planes.         Assessment & Plan:  1. L4-5 significant spinal stenosis, 8 mm at this level; also stenosis  at L5-S1.  2. Mild gait disorder.  3. History of trochanteric bursitis, stable.  4. Morbid  obesity.  5. Bilateral knee pain consistent with osteoarthritis.  6. Opioid dependence/Narcotic Monintering:  Pill count was appropriate  03/11/2012 urine drug screen was consistent  Evidence of aberrant behavior noted  Cautioned regarding operating machinery and vehicles on medication  No indication or report of risk to self or others  Reports good pain control with medication  No significant side effects from pain medication appreciated.    Plan.  Lyrica to 75 milligrams twice a day. Risks and benefits discussed   Vicodin  7.5 mg 0.5 tablets twice a day. #30   Recommend: Icing left knee for 20 minutes 4 times a day,  Voltaren gel to left knee 4 times a day. Use a walker rather than cane to help unload the knee over the next couple of weeks. Consider injection next month. Will see patient back in one month

## 2012-05-26 NOTE — Patient Instructions (Signed)
Please keep your pain medications locked up and in a secure location  Please ice your left knee 4 times a day for 20 minutes.  Use Voltaren gel 4 times a day as well.  Consider using your walker rather than your cane to help unload your left knee over the next couple of weeks  I will see you back in one month.  Please take your pain medications as directed  I'm glad to hear the Lyrica is helping.

## 2012-06-07 ENCOUNTER — Ambulatory Visit (HOSPITAL_COMMUNITY): Payer: Self-pay | Admitting: Psychiatry

## 2012-06-21 ENCOUNTER — Other Ambulatory Visit: Payer: Self-pay

## 2012-06-21 MED ORDER — PREGABALIN 75 MG PO CAPS: 75.0000 mg | ORAL_CAPSULE | Freq: Two times a day (BID) | ORAL | Status: DC

## 2012-06-22 ENCOUNTER — Ambulatory Visit (INDEPENDENT_AMBULATORY_CARE_PROVIDER_SITE_OTHER): Payer: Medicare Other | Admitting: Psychiatry

## 2012-06-22 DIAGNOSIS — F329 Major depressive disorder, single episode, unspecified: Secondary | ICD-10-CM

## 2012-06-22 NOTE — Progress Notes (Signed)
Patient:  [name]   DOB: Mar 14, 1981  MR Number: [id number]  Location: [facility]:  [address] [place],  [place], [address]  Start: Tuesday 06/22/2012 10:00 AM End: Tuesday 06/22/2012 10:50 AM  Provider/Observer:     Florencia Reasons, MSW, LCSW   Chief Complaint:      Chief Complaint  Patient presents with  . Anxiety    Reason For Service:     The patient was referred for services by psychiatrist Dr. Lolly Mustache to address trauma history and improve coping skills. The patient has a long-standing history of symptoms of anxiety and depression beginning in adolescence with symptoms worsening in the past several years. Patient has multiple health issues and states being diagnosed with uterine cancer in March 2011 resulting in patient having a total hysterectomy in April 2011. Patient also reports a history of violent thoughts towards self and periods of rage and anger. Patient is seen for follow up appointment today.  Interventions Strategy:  Supportive therapy, cognitive behavioral therapy  Participation Level:   Active  Participation Quality:  Appropriate      Behavioral Observation:  Well Groomed, appropriate,  Current Psychosocial Factors: The patient reports stress related to parents' ongoing marital issues  Content of Session:   Reviewing symptoms, processing feelings, reviewing coping and relaxation techniques  Current Status:   Patient reports decreased anxiety and worry but continued stress regarding parents' marital issues.  Patient Progress:   Good. Patient continues to experience chronic health issues along with pain but is managing this by pacing herself and becoming involved in various distracting activities. She reports increased stress recently due to to ongoing issues regarding her parents' marriage. She reports recently becoming very frustrated and angry with father wanting to scream but using her coping skills. She reports listening to music, focusing on  something else, talking to a friend, and using positive self talk. Patient reports decreased anxiety and worry now that she has accepted her gender preference. Patient states that she can now verbalize to herself that she is a lesbian and no longer feel conflicted.  She states she can't share this with her parents as they would disapprove. She reports increased loneliness in the past 2 weeks. Patient has maintained involvement in activity and continues to attend church regularly.  Target Goals:   1. Improve mood as evidenced by smiling more, resuming normal interest in activities, and decreasing emotional outbursts. 2. Improve ability to set and maintain boundaries as well as assertiveness skills. 3 improve coping skills.  Last Reviewed:   04/13/2011  Goals Addressed Today:    Goals 1 and 2  Impression/Diagnosis:   Patient presents with a long-standing history of anxiety, depression, and mood swings along with periods of rage. She also has experienced social withdrawal, crying spells and negative thoughts. Symptoms have decreased significantly in the past several months. Patient also has multiple health problems including degenerative disc disease and fibromyalgia. Diagnosis: major depressive disorder  Diagnosis:  Axis I:  1. Major depressive disorder             Axis II: Deferred

## 2012-06-22 NOTE — Patient Instructions (Signed)
Discussed orally 

## 2012-06-30 ENCOUNTER — Encounter: Payer: Medicare Other | Attending: Neurosurgery | Admitting: Physical Medicine and Rehabilitation

## 2012-06-30 ENCOUNTER — Encounter: Payer: Self-pay | Admitting: Physical Medicine and Rehabilitation

## 2012-06-30 VITALS — BP 133/76 | HR 93 | Resp 16 | Ht 62.0 in | Wt 379.0 lb

## 2012-06-30 DIAGNOSIS — M48061 Spinal stenosis, lumbar region without neurogenic claudication: Secondary | ICD-10-CM

## 2012-06-30 DIAGNOSIS — R269 Unspecified abnormalities of gait and mobility: Secondary | ICD-10-CM

## 2012-06-30 DIAGNOSIS — Z5181 Encounter for therapeutic drug level monitoring: Secondary | ICD-10-CM

## 2012-06-30 DIAGNOSIS — R2689 Other abnormalities of gait and mobility: Secondary | ICD-10-CM

## 2012-06-30 DIAGNOSIS — M25569 Pain in unspecified knee: Secondary | ICD-10-CM

## 2012-06-30 DIAGNOSIS — IMO0001 Reserved for inherently not codable concepts without codable children: Secondary | ICD-10-CM

## 2012-06-30 DIAGNOSIS — G8929 Other chronic pain: Secondary | ICD-10-CM

## 2012-06-30 DIAGNOSIS — Z760 Encounter for issue of repeat prescription: Secondary | ICD-10-CM

## 2012-06-30 MED ORDER — HYDROCODONE-ACETAMINOPHEN 7.5-325 MG PO TABS: 0.5000 | ORAL_TABLET | Freq: Two times a day (BID) | ORAL | Status: DC

## 2012-06-30 NOTE — Progress Notes (Signed)
Subjective:    Patient ID: [name], female    DOB: [birth year], 32 y.o.   MRN: [id number]  HPI  The patient is a 32 year old woman who is here at her [facility] for chronic pain complaints are related to her low back in her lower extremities. She also has a history of fibromyalgia.   She is here today for monitoring of her pain medication and refill.  She has neurogenic claudication.   She has pain in her lower extremities when she walks more than 5 minutes.   She underwent lumbar epidural steroid injection 07/17/2011. She noted approx  2 weeks of relief after the injection. Her ability to ambulate improved up to 5-10 minutes.   She continues to have multiple pain complaints which include low back pain, bilateral leg pain, bilateral knee pain, history of fibromyalgia.   Her pain has been stable. She has no new complaints.  She reports allergy to Voltaren gel, she states she broke without and was itching and therefore she discontinued it.      Pain Inventory Average Pain 7 Pain Right Now 4 My pain is constant, sharp, burning, stabbing and aching  In the last 24 hours, has pain interfered with the following? General activity 7 Relation with others 7 Enjoyment of life 7 What TIME of day is your pain at its worst? all the time Sleep (in general) Fair  Pain is worse with: walking, bending, sitting, standing and some activites Pain improves with: rest, medication and injections Relief from Meds: 7  Mobility walk without assistance use a cane use a walker how many minutes can you walk? 5 ability to climb steps?  yes do you drive?  no use a wheelchair transfers alone Do you have any goals in this area?  no  Function disabled: date disabled 2009 I need assistance with the following:  dressing, bathing, toileting, meal prep, household duties and shopping Do you have any goals in this area?  no  Neuro/Psych No problems in this  area  Prior Studies Any changes since last visit?  no  Physicians involved in your care Any changes since last visit?  no   Family History  Problem Relation Age of Onset  . Bipolar disorder Brother   . Other Brother     PTSD  . Bipolar disorder Maternal Aunt   . Colon cancer Neg Hx   . Liver disease Neg Hx    History   Social History  . Marital Status: Single    Spouse Name: N/A    Number of Children: 0  . Years of Education: N/A   Occupational History  . disability    Social History Main Topics  . Smoking status: Former Smoker -- 0.2 packs/day    Types: Cigarettes    Quit date: 01/02/2011  . Smokeless tobacco: Never Used  . Alcohol Use: No  . Drug Use: No  . Sexually Active: No   Other Topics Concern  . None   Social History Narrative  . None   Past Surgical History  Procedure Date  . Abdominal hysterectomy   . Mouth surgery     Wisdom tooth extracted  . Dilation and curettage of uterus   . Orthroscopic surgery     Right knee  . Carpel tunnel release surgery     right  . Esophagogastroduodenoscopy (egd) with esophageal dilation 05/10/2012    Procedure: ESOPHAGOGASTRODUODENOSCOPY (EGD) WITH ESOPHAGEAL DILATION;  Surgeon: Corbin Ade, MD;  Location: [facility]  END[facility];  Service: Endoscopy;  Laterality: N/A;  2:45/GIVE PHENERGAN 25MG  IV 30 MINS PRIOR TO PROCEDURE   Past Medical History  Diagnosis Date  . Fibromyalgia   . Degenerative joint disease   . Hypertension   . Sleep apnea     does not use CPAP, uses oxygen 2L around the clock  . Cancer April 2011    Uterine Cancer  . Diabetes mellitus type II   . Lumbago   . Lack of coordination   . Disturbance of skin sensation   . Chronic pain syndrome   . Pain in limb   . Spinal stenosis, lumbar region, without neurogenic claudication   . Enthesopathy of hip region   . Pain in joint, lower leg   . COPD (chronic obstructive pulmonary disease)   . Asthma   . Restrictive lung disease   . Depression    . Shortness of breath    BP 133/76  Pulse 93  Resp 16  Ht 5\' 2"  (1.575 m)  Wt 379 lb (171.913 kg)  BMI 69.32 kg/m2  SpO2 97%     Review of Systems  Musculoskeletal: Positive for myalgias, back pain and arthralgias.  All other systems reviewed and are negative.       Objective:   Physical Exam  Morbidly obese African American woman in no apparent distress Smiling most of interview. She sat on the exam table without difficulty.  Patient is alert oriented cooperative pleasant follows commands without difficulty answers questions appropriately   Affect is bright and alert today.  Cranial nerves and coordination are intact  Reflexes are diminished below the knees  Decreased sensation as noted below the knees especially over the dorsum of the right foot.  Motor strength is good and the lower extremities bilaterally 5 over 5 and hip flexors and extensors dorsiflexors plantar flexors.  Straight leg raise is negative  Transition slowly from sitting to standing.  Gait is slightly wide based with short stride length. She uses a cane for ambulation.  Difficulty with tandem gait. Right lower extremity is externally rotated during stance phase.  Full range of motion is noted at the knees. Effusion is not appreciated. Left medial joint line tenderness is noted. There's no crepitus noted today with flexion and extension at the knees.  Lumbar motion is limited in all planes.       Assessment & Plan:  1. L4-5 significant spinal stenosis, 8 mm at this level; also stenosis  at L5-S1.  2. Mild gait disorder.  3. History of trochanteric bursitis, stable.  4. Morbid obesity.  5. Bilateral knee pain consistent with osteoarthritis.  6. Opioid dependence/Narcotic Monintering:  Pill count was appropriate  03/11/2012 urine drug screen was consistent  No evidence of aberrant behavior noted  Cautioned regarding operating machinery and vehicles on medication  No indication or report of risk  to self or others  Reports good pain control with medication  No significant side effects from pain medication appreciated.    Plan:   Lyrica to 75 milligrams twice a day, patient reports feeling better on Lyrica, reports no side effects. Vicodin 7.5 mg 0.5 tablets twice a day. #30   Discontinue Voltaren gel secondary to allergy.   Recommend: Icing left knee for 20 minutes 4 times a day,  Use a walker rather than cane to help unload the knee over the next couple of weeks.  Patient is not interested in knee injection or physical therapy for knees. Will see patient back in  one month

## 2012-06-30 NOTE — Patient Instructions (Signed)
Please keep your medications locked up and in a secure location.  I'm glad to hear the Lyrica is still helping  I will see you back in one month.

## 2012-07-08 ENCOUNTER — Ambulatory Visit (INDEPENDENT_AMBULATORY_CARE_PROVIDER_SITE_OTHER): Payer: Medicare Other | Admitting: Psychiatry

## 2012-07-08 ENCOUNTER — Encounter (HOSPITAL_COMMUNITY): Payer: Self-pay | Admitting: Psychiatry

## 2012-07-08 DIAGNOSIS — F329 Major depressive disorder, single episode, unspecified: Secondary | ICD-10-CM

## 2012-07-08 DIAGNOSIS — IMO0001 Reserved for inherently not codable concepts without codable children: Secondary | ICD-10-CM

## 2012-07-08 DIAGNOSIS — F418 Other specified anxiety disorders: Secondary | ICD-10-CM

## 2012-07-08 DIAGNOSIS — M25569 Pain in unspecified knee: Secondary | ICD-10-CM

## 2012-07-08 DIAGNOSIS — F4310 Post-traumatic stress disorder, unspecified: Secondary | ICD-10-CM

## 2012-07-08 DIAGNOSIS — G4700 Insomnia, unspecified: Secondary | ICD-10-CM | POA: Insufficient documentation

## 2012-07-08 DIAGNOSIS — F323 Major depressive disorder, single episode, severe with psychotic features: Secondary | ICD-10-CM

## 2012-07-08 MED ORDER — FLUOXETINE HCL 40 MG PO CAPS: 40.0000 mg | ORAL_CAPSULE | Freq: Every evening | ORAL | Status: DC

## 2012-07-08 MED ORDER — BACLOFEN 10 MG PO TABS: 10.0000 mg | ORAL_TABLET | Freq: Three times a day (TID) | ORAL | Status: DC

## 2012-07-08 MED ORDER — LIDOCAINE 5 % EX PTCH: 1.0000 | MEDICATED_PATCH | Freq: Two times a day (BID) | CUTANEOUS | Status: DC

## 2012-07-08 MED ORDER — HYDROXYZINE PAMOATE 25 MG PO CAPS: 25.0000 mg | ORAL_CAPSULE | Freq: Three times a day (TID) | ORAL | Status: DC | PRN

## 2012-07-08 MED ORDER — ARIPIPRAZOLE 30 MG PO TABS: 30.0000 mg | ORAL_TABLET | Freq: Every day | ORAL | Status: DC
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
## 2012-07-08 NOTE — Patient Instructions (Addendum)
Yoga is a very helpful exercise method.  On TV or on line [employer] is a source of high quality information about yoga and videos on yoga.  Renee Ramus is the world's number one video yoga instructor according to some experts.  There are exceptional health benefits that can be achieved through yoga.  The main principles of yoga is acceptance, no competition, no comparison, and no judgement.  It is exceptional in helping people meditate and get to a very relaxed state.   Relaxation is the ultimate solution for you.  You can seek it through tub baths, bubble baths, essential oils or incense, walking or chatting with friends, listening to soft music, watching a candle burn and just letting all thoughts go and appreciating the true essence of the Creator.   Cymbalta may be substituted for the Prozac to better manage your pain.  Keep a journal or record of exercise every day start with 8 minutes a day and then increase gradually to 23 minutes a day.  Call if problems or concerns.

## 2012-07-20 ENCOUNTER — Telehealth (HOSPITAL_COMMUNITY): Payer: Self-pay | Admitting: Psychiatry

## 2012-07-20 ENCOUNTER — Ambulatory Visit (INDEPENDENT_AMBULATORY_CARE_PROVIDER_SITE_OTHER): Payer: Medicare Other | Admitting: Psychiatry

## 2012-07-20 DIAGNOSIS — F329 Major depressive disorder, single episode, unspecified: Secondary | ICD-10-CM

## 2012-07-20 DIAGNOSIS — M25569 Pain in unspecified knee: Secondary | ICD-10-CM

## 2012-07-20 DIAGNOSIS — F418 Other specified anxiety disorders: Secondary | ICD-10-CM

## 2012-07-20 NOTE — Progress Notes (Signed)
Patient:  [name]   DOB: 1980-11-26  MR Number: [id number]  Location: [facility] Center:  [address] [place],  [place], [address]  Start: Tuesday 07/20/2012 11:00 AM End: Tuesday 07/20/2012 11:50 AM  Provider/Observer:     Florencia Reasons, MSW, LCSW   Chief Complaint:      Chief Complaint  Patient presents with  . Depression    Reason For Service:     The patient was referred for services by psychiatrist Dr. Lolly Mustache to address trauma history and improve coping skills. The patient has a long-standing history of symptoms of anxiety and depression beginning in adolescence with symptoms worsening in the past several years. Patient has multiple health issues and states being diagnosed with uterine cancer in March 2011 resulting in patient having a total hysterectomy in April 2011. Patient also reports a history of violent thoughts towards self and periods of rage and anger. Patient is seen for follow up appointment today.  Interventions Strategy:  Supportive therapy, cognitive behavioral therapy  Participation Level:   Active  Participation Quality:  Appropriate      Behavioral Observation:  Well Groomed, appropriate,  Current Psychosocial Factors: The patient reports stress related to parents' ongoing marital issues, her health issues, and unwanted thoughts and attraction to a friend who plans to visit patient in a few weeks.  Content of Session:   Reviewing symptoms, processing feelings, reviewing coping and relaxation techniques  Current Status:   Patient reports increased anxiety, worry, and depressed mood. She denies suicidal and homicidal ideations.   Patient Progress:   Fair. Patient reports increased depressed mood and anxiety in the past several weeks. She attributes part of this to the change in weather as well as increased pain. She also reports ongoing concerns regarding her her parents as well as other transitions in her life. Patient expresses fear of failure. She  also reports increased anxiety as she is experiencing unwanted increased thoughts and attraction to an adult female friend who views patient as a maternal figure per patient's report. Patient expresses frustration about these thoughts as this friend will be visiting in a few weeks. Therapist works with patient to process her feelings and to review coping and relaxation techniques. Patient is pleased she has last 8 pounds. She also is looking forward to getting a tattoo on her birthday.     Target Goals:   1. Improve mood as evidenced by smiling more, resuming normal interest in activities, and decreasing emotional outbursts. 2. Improve ability to set and maintain boundaries as well as assertiveness skills. 3 improve coping skills.  Last Reviewed:   04/13/2011  Goals Addressed Today:    Goals 1 and 2  Impression/Diagnosis:   Patient presents with a long-standing history of anxiety, depression, and mood swings along with periods of rage. She also has experienced social withdrawal, crying spells and negative thoughts. Symptoms have decreased significantly in the past several months. Patient also has multiple health problems including degenerative disc disease and fibromyalgia. Diagnosis: major depressive disorder  Diagnosis:  Axis I:  1. Major depressive disorder             Axis II: Deferred

## 2012-07-20 NOTE — Patient Instructions (Signed)
Discussed orally 

## 2012-07-22 MED ORDER — DULOXETINE HCL 20 MG PO CPEP: 20.0000 mg | ORAL_CAPSULE | Freq: Two times a day (BID) | ORAL | Status: DC

## 2012-07-22 NOTE — Telephone Encounter (Signed)
Phone message completed in the phone message section.  

## 2012-07-28 ENCOUNTER — Ambulatory Visit: Payer: Self-pay | Admitting: Physical Medicine and Rehabilitation

## 2012-07-30 ENCOUNTER — Encounter: Payer: Medicare Other | Admitting: Physical Medicine and Rehabilitation

## 2012-07-31 ENCOUNTER — Other Ambulatory Visit: Payer: Self-pay

## 2012-08-03 ENCOUNTER — Encounter
Payer: Medicare Other | Attending: Physical Medicine and Rehabilitation | Admitting: Physical Medicine and Rehabilitation

## 2012-08-03 ENCOUNTER — Encounter: Payer: Self-pay | Admitting: Physical Medicine and Rehabilitation

## 2012-08-03 VITALS — BP 135/77 | HR 100 | Resp 18 | Ht 62.0 in | Wt 383.0 lb

## 2012-08-03 DIAGNOSIS — M545 Low back pain: Secondary | ICD-10-CM

## 2012-08-03 DIAGNOSIS — J4489 Other specified chronic obstructive pulmonary disease: Secondary | ICD-10-CM | POA: Insufficient documentation

## 2012-08-03 DIAGNOSIS — M1710 Unilateral primary osteoarthritis, unspecified knee: Secondary | ICD-10-CM | POA: Insufficient documentation

## 2012-08-03 DIAGNOSIS — Z9981 Dependence on supplemental oxygen: Secondary | ICD-10-CM | POA: Insufficient documentation

## 2012-08-03 DIAGNOSIS — M48061 Spinal stenosis, lumbar region without neurogenic claudication: Secondary | ICD-10-CM

## 2012-08-03 DIAGNOSIS — Z5181 Encounter for therapeutic drug level monitoring: Secondary | ICD-10-CM

## 2012-08-03 DIAGNOSIS — M76899 Other specified enthesopathies of unspecified lower limb, excluding foot: Secondary | ICD-10-CM | POA: Insufficient documentation

## 2012-08-03 DIAGNOSIS — R269 Unspecified abnormalities of gait and mobility: Secondary | ICD-10-CM | POA: Insufficient documentation

## 2012-08-03 DIAGNOSIS — J449 Chronic obstructive pulmonary disease, unspecified: Secondary | ICD-10-CM | POA: Insufficient documentation

## 2012-08-03 MED ORDER — HYDROCODONE-ACETAMINOPHEN 7.5-325 MG PO TABS: 0.5000 | ORAL_TABLET | Freq: Two times a day (BID) | ORAL | Status: DC

## 2012-08-03 NOTE — Progress Notes (Signed)
Subjective:    Patient ID: [name], female    DOB: 07-24-1980, 32 y.o.   MRN: [id number]  HPI The patient is a 32 year old female female, who presents with LBP and bilateral knee pain . The symptoms started over 10 years ago. The patient complains about moderate pain, which radiate to the right LE, on the posterior side, down to the back of her right knee. She describes the pain as intermittend sharp shooting pain in her right leg, and constant dull pain in her low back . Applying ice, taking medications , changing positions alleviate the symptoms. Prolonged sitting and standing aggrevates the symptoms. The patient grades his pain as a 6 /10. COPD on O2 since 8 month for 24 hr per day. Patient states that she started a yoga program, which helps some.   Pain Inventory Average Pain 6 Pain Right Now 9 My pain is constant, sharp, burning, stabbing and aching  In the last 24 hours, has pain interfered with the following? General activity 9 Relation with others 9 Enjoyment of life 9 What TIME of day is your pain at its worst? all Sleep (in general) Fair  Pain is worse with: walking, bending, sitting and standing Pain improves with: heat/ice, medication and injections Relief from Meds: 7  Mobility use a cane use a walker how many minutes can you walk? 5 ability to climb steps?  yes do you drive?  no  Function disabled: date disabled see chart I need assistance with the following:  dressing, bathing, toileting, meal prep, household duties and shopping  Neuro/Psych No problems in this area  Prior Studies Any changes since last visit?  no  Physicians involved in your care Any changes since last visit?  no   Family History  Problem Relation Age of Onset  . Bipolar disorder Brother   . Other Brother     PTSD  . Alcohol abuse Brother   . Bipolar disorder Maternal Aunt   . Anxiety disorder Maternal Aunt   . Paranoid behavior Maternal Aunt   . Colon cancer Neg Hx   . Liver  disease Neg Hx   . Depression Neg Hx   . Drug abuse Neg Hx   . Schizophrenia Neg Hx   . Seizures Neg Hx   . Physical abuse Neg Hx   . Sexual abuse Mother   . Dementia Paternal Aunt   . ADD / ADHD Cousin   . ADD / ADHD Cousin   . ADD / ADHD Cousin   . ADD / ADHD Cousin    History   Social History  . Marital Status: Single    Spouse Name: N/A    Number of Children: 0  . Years of Education: N/A   Occupational History  . disability    Social History Main Topics  . Smoking status: Former Smoker -- 0.25 packs/day    Types: Cigarettes    Quit date: 01/02/2011  . Smokeless tobacco: Never Used  . Alcohol Use: No  . Drug Use: No  . Sexually Active: No   Other Topics Concern  . None   Social History Narrative  . None   Past Surgical History  Procedure Laterality Date  . Abdominal hysterectomy    . Mouth surgery      Wisdom tooth extracted  . Dilation and curettage of uterus    . Orthroscopic surgery      Right knee  . Carpel tunnel release surgery      right  .  Esophagogastroduodenoscopy (egd) with esophageal dilation  05/10/2012    Procedure: ESOPHAGOGASTRODUODENOSCOPY (EGD) WITH ESOPHAGEAL DILATION;  Surgeon: Corbin Ade, MD;  Location: [facility];  Service: Endoscopy;  Laterality: N/A;  2:45/GIVE PHENERGAN 25MG  IV 30 MINS PRIOR TO PROCEDURE   Past Medical History  Diagnosis Date  . Fibromyalgia   . Degenerative joint disease   . Hypertension   . Sleep apnea     does not use CPAP, uses oxygen 2L around the clock  . Cancer April 2011    Uterine Cancer  . Diabetes mellitus type II   . Lumbago   . Lack of coordination   . Disturbance of skin sensation   . Chronic pain syndrome   . Pain in limb   . Spinal stenosis, lumbar region, without neurogenic claudication   . Enthesopathy of hip region   . Pain in joint, lower leg   . COPD (chronic obstructive pulmonary disease)   . Asthma   . Restrictive lung disease   . Depression   . Shortness of breath   .  PTSD (post-traumatic stress disorder)    BP 135/77  Pulse 100  Resp 18  Ht 5\' 2"  (1.575 m)  Wt 383 lb (173.728 kg)  BMI 70.03 kg/m2  SpO2 91%    Review of Systems  Cardiovascular: Positive for leg swelling.  Musculoskeletal: Positive for back pain.  All other systems reviewed and are negative.       Objective:   Physical Exam Constitutional: She is oriented to person, place, and time. She appears well-developed and well-nourished.  Morbidly obese  HENT:  Head: Normocephalic.  Neck: Neck supple.  Musculoskeletal: She exhibits tenderness.  Neurological: She is alert and oriented to person, place, and time.  Skin: Skin is warm and dry.  Psychiatric: She has a normal mood and affect.  Symmetric normal motor tone is noted throughout. Normal muscle bulk. Muscle testing reveals 5/5 muscle strength of the upper extremity, and 5/5 of the lower extremity. Full range of motion in upper and lower extremities. ROM of spine is restricted. Fine motor movements are normal in both hands.  Sensory is intact and symmetric to light touch, pinprick and proprioception.  DTR in the upper and lower extremity are present and symmetric 1+. No clonus is noted.  Patient arises from chair with difficulty. Wide based gait with a cane , able to stand on heels and toes . No pronator drift. Rhomberg negative.  On O2 24 hrs.        Assessment & Plan:  1. L4-5 significant spinal stenosis, 8 mm at this level; also stenosis  at L5-S1.  2. Mild gait disorder.  3. History of trochanteric bursitis, stable.  4. Morbid obesity.  5. Bilateral knee pain consistent with osteoarthritis.  6. COPD, on O2 24 hrs.  The Opioid medication pill count was appropriate. No reported significant side effects of Opioid medications noted. No aberrant behavior noted. Patient cautioned regarding operation of machinery or vehicles. Patient understands risk and benefits of these medications. There is no indication or report of  risk to self or others.  Epidural steroid injections did not provide release more than 2 weeks and we will not continue to repeat them.  I continue to encourage her to restart her walking program, when she feels better and do the exercises she learned from PT, which were not hurting her. Also might consider aquatic therapy, if pulmonologist would approve. Emphasis on minimizing narcotic pain medication and promoting activity.  She  uses one 7.5 mg /day Vicodin per day, 1/2 a tablet am and 1/2 at hs.

## 2012-08-03 NOTE — Patient Instructions (Signed)
Continue with your yoga program as long as it does not aggrevate your pain.

## 2012-08-03 NOTE — Addendum Note (Signed)
Addended by: Judd Gaudier on: 08/03/2012 01:39 PM   Modules accepted: Orders

## 2012-08-09 ENCOUNTER — Encounter: Payer: Self-pay | Admitting: Internal Medicine

## 2012-08-10 ENCOUNTER — Ambulatory Visit (INDEPENDENT_AMBULATORY_CARE_PROVIDER_SITE_OTHER): Payer: Medicare Other | Admitting: Psychiatry

## 2012-08-10 ENCOUNTER — Encounter: Payer: Self-pay | Admitting: Urgent Care

## 2012-08-10 ENCOUNTER — Ambulatory Visit (INDEPENDENT_AMBULATORY_CARE_PROVIDER_SITE_OTHER): Payer: Medicare Other | Admitting: Urgent Care

## 2012-08-10 DIAGNOSIS — F329 Major depressive disorder, single episode, unspecified: Secondary | ICD-10-CM

## 2012-08-10 DIAGNOSIS — Q433 Congenital malformations of intestinal fixation: Secondary | ICD-10-CM

## 2012-08-10 DIAGNOSIS — K219 Gastro-esophageal reflux disease without esophagitis: Secondary | ICD-10-CM

## 2012-08-10 NOTE — Progress Notes (Signed)
Faxed to PCP

## 2012-08-10 NOTE — Assessment & Plan Note (Signed)
Recommend 1-2# weight loss per week until ideal body weight through exercise & diet. Low fat/cholesterol diet.   Avoid sweets, sodas, fruit juices, sweetened beverages like tea, etc. Gradually increase walking from 5 min daily up to 15-30 minutes per day 5 days/week. Limit alcohol use.  Office visit in 6 months

## 2012-08-10 NOTE — Patient Instructions (Addendum)
Continue omeprazole daily Recommend 1-2# weight loss per week until ideal body weight through exercise & diet. Low fat/cholesterol diet.   Avoid sweets, sodas, fruit juices, sweetened beverages like tea, etc. Gradually increase walking from 5 min daily up to 15-30 minutes per day 5 days/week. Limit alcohol use.  Fat and Cholesterol Control Diet Cholesterol is a wax-like substance. It comes from your liver and is found in certain foods. There is good (HDL) and bad (LDL) cholesterol. Too much cholesterol in your blood can affect your heart. Certain foods can lower or raise your cholesterol. Eat foods that are low in cholesterol. Saturated and trans fats are bad fats found in foods that will raise your cholesterol. Do not eat foods that are high in saturated and trans fats. FOODS HIGHER IN SATURATED AND TRANS FATS  Dairy products, such as whole milk, eggs, cheese, cream, and butter.  Fatty meats, such as hot dogs, sausage, and salami.  Fried foods.  Trans fats which are found in margarine and pre-made cookies, crackers, and baked goods.  Tropical oils, such as coconut and palm oils. Read package labels at the store. Do not buy products that use saturated or trans fats or hydrogenated oils. Find foods labeled:  Low-fat.  Low-saturated fat.  Trans-fat-free.  Low-cholesterol. FOODS LOWER IN CHOLESTEROL   Fruit.  Vegetables.  Beans, peas, and lentils.  Fish.  Lean meat, such as chicken (without skin) or ground [place].  Grains, such as barley, rice, couscous, bulgur wheat, and pasta.  Heart-healthy tub margarine. PREPARING YOUR FOOD  Broil, bake, steam, or roast foods. Do not fry food.  Use non-stick cooking sprays.  Use lemon or herbs to flavor food instead of using butter or stick margarine.  Use nonfat yogurt, salsa, or low-fat dressings for salads. Document Released: 12/02/2011 Document Reviewed: 09/02/2011 [facility] Patient Information 2013 [place],  [place].   Gastroesophageal Reflux Disease, Adult Gastroesophageal reflux disease (GERD) happens when acid from your stomach flows up into the esophagus. When acid comes in contact with the esophagus, the acid causes soreness (inflammation) in the esophagus. Over time, GERD may create small holes (ulcers) in the lining of the esophagus. CAUSES   Increased body weight. This puts pressure on the stomach, making acid rise from the stomach into the esophagus.  Smoking. This increases acid production in the stomach.  Drinking alcohol. This causes decreased pressure in the lower esophageal sphincter (valve or ring of muscle between the esophagus and stomach), allowing acid from the stomach into the esophagus.  Late evening meals and a full stomach. This increases pressure and acid production in the stomach.  A malformed lower esophageal sphincter. Sometimes, no cause is found. SYMPTOMS   Burning pain in the lower part of the mid-chest behind the breastbone and in the mid-stomach area. This may occur twice a week or more often.  Trouble swallowing.  Sore throat.  Dry cough.  Asthma-like symptoms including chest tightness, shortness of breath, or wheezing. DIAGNOSIS  Your caregiver may be able to diagnose GERD based on your symptoms. In some cases, X-rays and other tests may be done to check for complications or to check the condition of your stomach and esophagus. TREATMENT  Your caregiver may recommend over-the-counter or prescription medicines to help decrease acid production. Ask your caregiver before starting or adding any new medicines.  HOME CARE INSTRUCTIONS   Change the factors that you can control. Ask your caregiver for guidance concerning weight loss, quitting smoking, and alcohol consumption.  Avoid foods and drinks  that make your symptoms worse, such as:  Caffeine or alcoholic drinks.  Chocolate.  Peppermint or mint flavorings.  Garlic and onions.  Spicy  foods.  Citrus fruits, such as oranges, lemons, or limes.  Tomato-based foods such as sauce, chili, salsa, and pizza.  Fried and fatty foods.  Avoid lying down for the 3 hours prior to your bedtime or prior to taking a nap.  Eat small, frequent meals instead of large meals.  Wear loose-fitting clothing. Do not wear anything tight around your waist that causes pressure on your stomach.  Raise the head of your bed 6 to 8 inches with wood blocks to help you sleep. Extra pillows will not help.  Only take over-the-counter or prescription medicines for pain, discomfort, or fever as directed by your caregiver.  Do not take aspirin, ibuprofen, or other nonsteroidal anti-inflammatory drugs (NSAIDs). SEEK IMMEDIATE MEDICAL CARE IF:   You have pain in your arms, neck, jaw, teeth, or back.  Your pain increases or changes in intensity or duration.  You develop nausea, vomiting, or sweating (diaphoresis).  You develop shortness of breath, or you faint.  Your vomit is green, yellow, black, or looks like coffee grounds or blood.  Your stool is red, bloody, or black. These symptoms could be signs of other problems, such as heart disease, gastric bleeding, or esophageal bleeding. MAKE SURE YOU:   Understand these instructions.  Will watch your condition.  Will get help right away if you are not doing well or get worse. Document Released: 03/12/2005 Document Revised: 08/25/2011 Document Reviewed: 12/20/2010 [facility] Patient Information 2013 [place], [place].

## 2012-08-10 NOTE — Progress Notes (Signed)
Patient:  [name]   DOB: [birth year]  MR Number: [id number]  [facility]:  [address] [place],  [place], [address]  Start: Tuesday 08/10/2012 9:00 AM End: Tuesday 08/10/2012 9:50 AM  Provider/Observer:     Florencia Reasons, MSW, LCSW   Chief Complaint:      Chief Complaint  Patient presents with  . Depression    Reason For Service:     The patient was referred for services by psychiatrist Dr. Lolly Mustache to address trauma history and improve coping skills. The patient has a long-standing history of symptoms of anxiety and depression beginning in adolescence with symptoms worsening in the past several years. Patient has multiple health issues and states being diagnosed with uterine cancer in March 2011 resulting in patient having a total hysterectomy in April 2011. Patient also reports a history of violent thoughts towards self and periods of rage and anger. Patient is seen for follow up appointment today.  Interventions Strategy:  Supportive therapy, cognitive behavioral therapy  Participation Level:   Active  Participation Quality:  Appropriate      Behavioral Observation:  Well Groomed, appropriate,  Current Psychosocial Factors: The patient reports recently learning that her attraction to her female friend is reciprocated.  Content of Session:   Reviewing symptoms, processing feelings, reviewing coping and relaxation techniques, working with patient and mother to facilitate support for patient  Current Status:   Patient reports increased anxiety, worry, and depressed mood. She denies current suicidal ideations but reports having suicidal ideations for the past 2 weeks and admits thinking of overdosing on pills. Patient states she will not harm self.  Patient and mother agree that mother will manage patient's medications at this time. Patient and mother also agree to call this practice, call 911, or take patient to the ER should symptoms worsen. Patient is  scheduled to see Dr. Dan Humphreys on 08/19/2012. Patient agrees to return to see this clinician next week.  Patient Progress:   Poor. Patient reports increased depressed mood and anxiety for the past 2 weeks.  She is unable to identify a specific trigger. She has become more fearful about leaving home and only leaves home for medical appointments. She maintains interaction with her parents and her friend. Patient expresses frustration that her brother's girlfriend recently had a baby. However, patient and her family were not aware that patient's brother was expecting a baby. Patient expresses frustration and disappointment with her brother. Patient shares more information today about her relationship with her female friend and reports learning that her feelings for her friend are reciprocated. Patient reports loving her friend very much and being willing to walk away from her involvement in church as well as tell her parents about being a lesbian. Patient states she now wants to be happy and deserves to be happy.   Target Goals:   1. Improve mood as evidenced by smiling more, resuming normal interest in activities, and decreasing emotional outbursts. 2. Improve ability to set and maintain boundaries as well as assertiveness skills. 3 improve coping skills.  Last Reviewed:   04/13/2011  Goals Addressed Today:    Goals 1 and 2  Impression/Diagnosis:   Patient presents with a long-standing history of anxiety, depression, and mood swings along with periods of rage. She also has experienced social withdrawal, crying spells and negative thoughts. Symptoms have decreased significantly in the past several months. Patient also has multiple health problems including degenerative disc disease and fibromyalgia. Diagnosis: major depressive disorder  Diagnosis:  Axis I:  Major depressive disorder          Axis II: Deferred

## 2012-08-10 NOTE — Progress Notes (Signed)
Referring Provider: Carylon Perches, MD Primary Care Physician:  Carylon Perches, MD Primary Gastroenterologist:  Dr. Jena Gauss  Chief Complaint  Patient presents with  . Follow-up    HPI:  [name] is a 32 y.o. female here for follow up for GERD.  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  She has been taking omeprazole 20 mg daily. She feels very well.  She has nocturnal symptoms especially when she eats late-night meals. Otherwise she feels great. She denies abdominal pain, nausea, vomiting, dysphagia or odynophagia. Her appetite is good. She is wanting to lose some weight. She was recently diagnosed with PTSD & saw Dr Dan Humphreys in [facility] 1/23.   he did change around some of her medications.      Past Medical History  Diagnosis Date  . Fibromyalgia   . Degenerative joint disease   . Hypertension   . Sleep apnea     does not use CPAP, uses oxygen 2L around the clock  . Cancer April 2011    Uterine Cancer  . Diabetes mellitus type II   . Lumbago   . Lack of coordination   . Disturbance of skin sensation   . Chronic pain syndrome   . Pain in limb   . Spinal stenosis, lumbar region, without neurogenic claudication   . Enthesopathy of hip region   . Pain in joint, lower leg   . COPD (chronic obstructive pulmonary disease)   . Asthma   . Restrictive lung disease   . Depression   . Shortness of breath   . PTSD (post-traumatic stress disorder)   . GERD (gastroesophageal reflux disease)     Past Surgical History  Procedure Laterality Date  . Abdominal hysterectomy    . Mouth surgery      Wisdom tooth extracted  . Dilation and curettage of uterus    . Orthroscopic surgery      Right knee  . Carpel tunnel release surgery      right  . Esophagogastroduodenoscopy (egd) with esophageal dilation  05/10/2012    YNW:[id number] dissecans-nonspecific, s/p 1F maloney    Current Outpatient Prescriptions  Medication Sig Dispense Refill  . albuterol  (PROAIR HFA) 108 (90 BASE) MCG/ACT inhaler Inhale 2 puffs into the lungs every 6 (six) hours as needed. For rescue      . ARIPiprazole (ABILIFY) 30 MG tablet Take 1 tablet (30 mg total) by mouth at bedtime.  30 tablet  2  . atorvastatin (LIPITOR) 80 MG tablet Take 80 mg by mouth every evening.       . B-D UF III MINI PEN NEEDLES 31G X 5 MM MISC       . budesonide-formoterol (SYMBICORT) 160-4.5 MCG/ACT inhaler Inhale 2 puffs into the lungs 2 (two) times daily.      . DULoxetine (CYMBALTA) 20 MG capsule Take 1 capsule (20 mg total) by mouth 2 (two) times daily.  60 capsule  2  . estrogens, conjugated, (PREMARIN) 0.45 MG tablet Take 0.45 mg by mouth every morning.      Marland Kitchen HYDROcodone-acetaminophen (NORCO) 7.5-325 MG per tablet Take 0.5 tablets by mouth 2 (two) times daily.  30 tablet  0  . hydrOXYzine (VISTARIL) 25 MG capsule Take 1 capsule (25 mg total) by mouth 3 (three) times daily as needed for anxiety.  90 capsule  2  . ketoconazole (NIZORAL) 2 % cream Apply 1 application topically daily as needed.      Marland Kitchen lisinopril (PRINIVIL,ZESTRIL) 10 MG  tablet Take 10 mg by mouth every morning.       . metoprolol (LOPRESSOR) 50 MG tablet Take 50 mg by mouth 2 (two) times daily.        Marland Kitchen nystatin (MYCOSTATIN) powder Apply 1 application topically Daily. Applied to folds and under breasts as needed for irritation and rash      . omeprazole (PRILOSEC) 20 MG capsule Take 1 capsule (20 mg total) by mouth daily before breakfast. Start when you run out of protonix  30 capsule  11  . pregabalin (LYRICA) 75 MG capsule Take 1 capsule (75 mg total) by mouth 2 (two) times daily.  60 capsule  1  . VICTOZA 18 MG/3ML SOLN        No current facility-administered medications for this visit.    Allergies as of 08/10/2012 - Review Complete 08/10/2012  Allergen Reaction Noted  . Avocado Anaphylaxis 07/08/2011  . Codeine Anaphylaxis and Itching 02/20/2009  . Flexeril (cyclobenzaprine hcl) Anaphylaxis 07/08/2011  . Marflex  (orphenadrine citrate) Anaphylaxis 05/03/2011  . Tramadol Itching 05/03/2011  . Cyclobenzaprine Itching 04/22/2012  . Other  04/29/2012  . Voltaren (diclofenac sodium)  06/30/2012    Review of Systems: Gen: see history of present illness  CV: Denies chest pain, angina, palpitations, syncope, orthopnea, PND, peripheral edema, and claudication. Resp: Denies dyspnea at rest, dyspnea with exercise, cough, sputum, wheezing, coughing up blood, and pleurisy. GI: Denies vomiting blood, jaundice. Derm: Denies rash, itching, dry skin, hives, moles, warts, or unhealing ulcers.  Psych: Denies depression, anxiety, memory loss, suicidal ideation, hallucinations, paranoia, and confusion. Heme: Denies bruising, bleeding, and enlarged lymph nodes.  Physical Exam: BP 126/74  Pulse 78  Temp(Src) 98.2 F (36.8 C) (Oral)  Ht 5\' 5"  (1.651 m)  Wt 382 lb 12.8 oz (173.637 kg)  BMI 63.7 kg/m2 General:   Alert,  Well-developed, obese, pleasant and cooperative in NAD.  Accompanied by her mother today. Eyes:  Sclera clear, no icterus.   Conjunctiva pink. Mouth:  No deformity or lesions, oropharynx pink and moist. O2 via nasal cannula. Neck:  Supple; no masses or thyromegaly. Heart:  Regular rate and rhythm; no murmurs, clicks, rubs,  or gallops. Abdomen:  Protuberant. Normal bowel sounds.  No bruits.  Soft, non-tender and non-distended without masses, hepatosplenomegaly or hernias noted.  No guarding or rebound tenderness.  Exam limited given patient's body habitus. Rectal:  Deferred. Msk:  Symmetrical without gross deformities.  Pulses:  Normal pulses noted. Extremities:  + clubbing. No edema. Neurologic:  Alert and oriented x4;  grossly normal neurologically. Skin:  Intact without significant lesions or rashes.

## 2012-08-10 NOTE — Assessment & Plan Note (Addendum)
Chronic GERD well controlled on omeprazole 20 mg daily. We had a 20 minute conversation regarding lifestyle modifications including diet, exercise, weight loss, and avoiding late night needing.  GERD diet and precautions given

## 2012-08-10 NOTE — Patient Instructions (Signed)
Discussed orally 

## 2012-08-19 ENCOUNTER — Ambulatory Visit (INDEPENDENT_AMBULATORY_CARE_PROVIDER_SITE_OTHER): Payer: Medicare Other | Admitting: Psychiatry

## 2012-08-19 ENCOUNTER — Encounter (HOSPITAL_COMMUNITY): Payer: Self-pay | Admitting: Psychiatry

## 2012-08-19 VITALS — Wt 377.6 lb

## 2012-08-19 DIAGNOSIS — G4700 Insomnia, unspecified: Secondary | ICD-10-CM

## 2012-08-19 DIAGNOSIS — F329 Major depressive disorder, single episode, unspecified: Secondary | ICD-10-CM

## 2012-08-19 DIAGNOSIS — IMO0001 Reserved for inherently not codable concepts without codable children: Secondary | ICD-10-CM

## 2012-08-19 DIAGNOSIS — F4310 Post-traumatic stress disorder, unspecified: Secondary | ICD-10-CM

## 2012-08-19 DIAGNOSIS — F323 Major depressive disorder, single episode, severe with psychotic features: Secondary | ICD-10-CM

## 2012-08-19 DIAGNOSIS — G8929 Other chronic pain: Secondary | ICD-10-CM

## 2012-08-19 DIAGNOSIS — F418 Other specified anxiety disorders: Secondary | ICD-10-CM

## 2012-08-19 MED ORDER — PREGABALIN 100 MG PO CAPS: 100.0000 mg | ORAL_CAPSULE | Freq: Two times a day (BID) | ORAL | Status: DC

## 2012-08-19 MED ORDER — DULOXETINE HCL 30 MG PO CPEP: 30.0000 mg | ORAL_CAPSULE | Freq: Two times a day (BID) | ORAL | Status: DC

## 2012-08-19 MED ORDER — HYDROXYZINE PAMOATE 50 MG PO CAPS: 50.0000 mg | ORAL_CAPSULE | Freq: Three times a day (TID) | ORAL | Status: DC | PRN
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
## 2012-08-19 NOTE — Patient Instructions (Addendum)
Set a timer for 8 minutes and walk for that amount of time in the house or in the yard.  Mark "8" on a calendar for that day.  Do that every day this week.  Then next week increase the time to 9 minutes and then mark the calendar with a 9 for that day.  Each week increase your exercise by one minute.  Keep a record of this so you can see what progress you are making.  Do this every day, just like eating and sleeping.  It is good for pain control, depression, and for your soul/spirit.  Bring the record in for your nest visit so we can talk about your effort and how you feel with the new exercise program going and working for you.  Yoga is a very helpful exercise method.  On TV, on line, or by DVD [name] is a source of high quality information about yoga and videos on yoga.  [name] is the world's number one video yoga instructor according to some experts.  There are exceptional health benefits that can be achieved through yoga.  The main principles of yoga is acceptance, no competition, no comparison, and no judgement.  It is exceptional in helping people meditate and get to a very relaxed state.   Try cutting the hydrocodone to 1/4 tab twice a day.  Use the Yoga and meditation to help with the pain maanagement  Call if problems or concerns.

## 2012-08-19 NOTE — Addendum Note (Signed)
Addended by: Mike Craze on: 08/19/2012 04:10 PM   Modules accepted: Orders

## 2012-08-20 ENCOUNTER — Ambulatory Visit (HOSPITAL_COMMUNITY): Payer: Self-pay | Admitting: Psychiatry

## 2012-08-23 ENCOUNTER — Other Ambulatory Visit: Payer: Self-pay

## 2012-08-23 DIAGNOSIS — IMO0001 Reserved for inherently not codable concepts without codable children: Secondary | ICD-10-CM

## 2012-08-23 DIAGNOSIS — G8929 Other chronic pain: Secondary | ICD-10-CM

## 2012-08-23 MED ORDER — PREGABALIN 100 MG PO CAPS: 100.0000 mg | ORAL_CAPSULE | Freq: Two times a day (BID) | ORAL | Status: DC

## 2012-08-24 ENCOUNTER — Ambulatory Visit (INDEPENDENT_AMBULATORY_CARE_PROVIDER_SITE_OTHER): Payer: Medicare Other | Admitting: Psychiatry

## 2012-08-24 DIAGNOSIS — F331 Major depressive disorder, recurrent, moderate: Secondary | ICD-10-CM

## 2012-08-24 NOTE — Progress Notes (Signed)
Patient:  [name]   DOB: 1980-07-24  MR Number: [id number]  Location: [facility]:  [address] [place],  [place], [address]  Start: Tuesday 08/24/2012 9:00 AM End: Tuesday 08/24/2012 9:50 AM  Json Koelzer/Observer:     Florencia Reasons, MSW, LCSW   Chief Complaint:      Chief Complaint  Patient presents with  . Depression    Reason For Service:     The patient was referred for services by psychiatrist Dr. Lolly Mustache to address trauma history and improve coping skills. The patient has a long-standing history of symptoms of anxiety and depression beginning in adolescence with symptoms worsening in the past several years. Patient has multiple health issues and states being diagnosed with uterine cancer in March 2011 resulting in patient having a total hysterectomy in April 2011. Patient also reports a history of violent thoughts towards self and periods of rage and anger. Patient is seen for follow up appointment today.  Interventions Strategy:  Supportive therapy, cognitive behavioral therapy  Participation Level:   Active  Participation Quality:  Appropriate      Behavioral Observation:  Well Groomed, appropriate,  Current Psychosocial Factors: The patient reports her friend is visiting for 2 weeks.  Content of Session:   Reviewing symptoms, processing feelings, reinforcing patient's efforts to improve self-care, working with patient to identify compensatory tools for memory difficulty  Current Status:   Patient reports decreased anxiety, decreased worry, and improved mood. She denies having any active suicidal ideations since last session but admits having passive suicidal ideations when experiencing  painful emotions. She denies any plan and any intent. The intensity and frequency of passive suicidal ideations have decreased. Patient's mother continues to manage patient's medications per patient's report. Patient reports significant difficulty with recent memory and excessive  appetite.  Patient Progress:   Good. Patient reports decreased anxiety and improved mood. She describes her mood as happy today. Patient reports improved sleep pattern and decreased pain intensity since  taking increased dosage of Lyrica, Vistaril, and Cymbalta per Dr. Tilman Neat instructions per patient's report. Patient expresses frustration regarding continued difficulty with recent memory. Therapist works with patient to identify compensatory tools. Patient is pleased that her friend is visiting for 2 weeks. She reports having a deeper and open conversation with her friend about her feelings and now being cleared that her feelings are reciprocated. Patient reports her parents are not aware of her relationship. She also reports absence of any stress regarding the relationship and states she is in love.   Target Goals:   1. Improve mood as evidenced by smiling more, resuming normal interest in activities, and decreasing emotional outbursts. 2. Improve ability to set and maintain boundaries as well as assertiveness skills. 3 improve coping skills.  Last Reviewed:   04/13/2011  Goals Addressed Today:    Goals 1 and 3  Impression/Diagnosis:   Patient presents with a long-standing history of anxiety, depression, and mood swings along with periods of rage. She also has experienced social withdrawal, crying spells and negative thoughts. Symptoms have decreased significantly in the past several months. Patient also has multiple health problems including degenerative disc disease and fibromyalgia. Diagnosis: major depressive disorder  Diagnosis:  Axis I:  Major depressive disorder, recurrent, moderate          Axis II: Deferred

## 2012-08-24 NOTE — Patient Instructions (Signed)
Use compensatory tools - journaling and lists  Use rating system daily to monitor symptoms

## 2012-08-27 ENCOUNTER — Encounter: Payer: Self-pay | Admitting: Physical Medicine and Rehabilitation

## 2012-08-27 ENCOUNTER — Encounter
Payer: Medicare Other | Attending: Physical Medicine and Rehabilitation | Admitting: Physical Medicine and Rehabilitation

## 2012-08-27 VITALS — BP 145/85 | HR 86 | Resp 16 | Ht 62.0 in | Wt 384.0 lb

## 2012-08-27 DIAGNOSIS — IMO0001 Reserved for inherently not codable concepts without codable children: Secondary | ICD-10-CM

## 2012-08-27 DIAGNOSIS — M48061 Spinal stenosis, lumbar region without neurogenic claudication: Secondary | ICD-10-CM

## 2012-08-27 DIAGNOSIS — J4489 Other specified chronic obstructive pulmonary disease: Secondary | ICD-10-CM | POA: Insufficient documentation

## 2012-08-27 DIAGNOSIS — J449 Chronic obstructive pulmonary disease, unspecified: Secondary | ICD-10-CM | POA: Insufficient documentation

## 2012-08-27 DIAGNOSIS — M25569 Pain in unspecified knee: Secondary | ICD-10-CM | POA: Insufficient documentation

## 2012-08-27 DIAGNOSIS — R269 Unspecified abnormalities of gait and mobility: Secondary | ICD-10-CM | POA: Insufficient documentation

## 2012-08-27 DIAGNOSIS — M76899 Other specified enthesopathies of unspecified lower limb, excluding foot: Secondary | ICD-10-CM | POA: Insufficient documentation

## 2012-08-27 NOTE — Patient Instructions (Signed)
Ask your pulmonologist, whether it would be ok if we prescribe aquatic therapy.  Continue with your yoga exercises as tolerated.

## 2012-08-27 NOTE — Progress Notes (Signed)
Subjective:    Patient ID: [name], female    DOB: 08-20-1980, 32 y.o.   MRN: [id number]  HPI The patient is a 32 year old female, who presents with LBP and bilateral knee pain . The symptoms started over 10 years ago. The patient complains about moderate pain, which radiate to the right LE, on the posterior side, down to the back of her right knee. She describes the pain as intermittend sharp shooting pain in her right leg, and constant dull pain in her low back . Applying ice, taking medications , changing positions alleviate the symptoms. Prolonged sitting and standing aggrevates the symptoms. The patient grades his pain as a 6 /10. COPD on O2 since 8 month for 24 hr per day. Patient states that she started a yoga program, which helps some. The patient wants to d/c her Hydrocodone, she is only taking 1/4 - 1/2 of a tablet bid . She states, that her Psychiatrist has told her that this medication can increase her depression. I advised her to take 1/2 of the tablet once a day for one week, then 1/4 of a tablet once a day for another week, and then d/c.   Pain Inventory Average Pain 7 Pain Right Now 9 My pain is constant, sharp, burning, dull, stabbing, tingling and aching  In the last 24 hours, has pain interfered with the following? General activity 9 Relation with others 9 Enjoyment of life 9 What TIME of day is your pain at its worst? all the time Sleep (in general) Fair  Pain is worse with: walking, bending, sitting and standing Pain improves with: rest, heat/ice, therapy/exercise, pacing activities and medication Relief from Meds: 9  Mobility walk without assistance use a cane use a walker how many minutes can you walk? 5 ability to climb steps?  yes do you drive?  no use a wheelchair transfers alone Do you have any goals in this area?  no  Function disabled: date disabled 2010 I need assistance with the following:  dressing, bathing, toileting, meal prep, household  duties and shopping Do you have any goals in this area?  no  Neuro/Psych No problems in this area  Prior Studies Any changes since last visit?  no  Physicians involved in your care Any changes since last visit?  no   Family History  Problem Relation Age of Onset  . Bipolar disorder Brother   . Other Brother     PTSD  . Alcohol abuse Brother   . Bipolar disorder Maternal Aunt   . Anxiety disorder Maternal Aunt   . Paranoid behavior Maternal Aunt   . Colon cancer Neg Hx   . Liver disease Neg Hx   . Depression Neg Hx   . Drug abuse Neg Hx   . Schizophrenia Neg Hx   . Seizures Neg Hx   . Physical abuse Neg Hx   . Sexual abuse Mother   . Dementia Paternal Aunt   . ADD / ADHD Cousin   . ADD / ADHD Cousin   . ADD / ADHD Cousin   . ADD / ADHD Cousin    History   Social History  . Marital Status: Single    Spouse Name: N/A    Number of Children: 0  . Years of Education: N/A   Occupational History  . disability    Social History Main Topics  . Smoking status: Former Smoker -- 0.25 packs/day    Types: Cigarettes    Quit date: 01/02/2011  .  Smokeless tobacco: Never Used  . Alcohol Use: Yes     Comment: occasional wine  . Drug Use: No  . Sexually Active: No   Other Topics Concern  . None   Social History Narrative   Lives w/ mother & father         Past Surgical History  Procedure Laterality Date  . Abdominal hysterectomy    . Mouth surgery      Wisdom tooth extracted  . Dilation and curettage of uterus    . Orthroscopic surgery      Right knee  . Carpel tunnel release surgery      right  . Esophagogastroduodenoscopy (egd) with esophageal dilation  05/10/2012    WUJ:[id number] dissecans-nonspecific, s/p 59F maloney   Past Medical History  Diagnosis Date  . Fibromyalgia   . Degenerative joint disease   . Hypertension   . Sleep apnea     does not use CPAP, uses oxygen 2L around the clock  . Cancer April 2011    Uterine Cancer  . Diabetes  mellitus type II   . Lumbago   . Lack of coordination   . Disturbance of skin sensation   . Chronic pain syndrome   . Pain in limb   . Spinal stenosis, lumbar region, without neurogenic claudication   . Enthesopathy of hip region   . Pain in joint, lower leg   . COPD (chronic obstructive pulmonary disease)   . Asthma   . Restrictive lung disease   . Depression   . Shortness of breath   . PTSD (post-traumatic stress disorder)   . GERD (gastroesophageal reflux disease)    BP 145/85  Pulse 86  Resp 16  Ht 5\' 2"  (1.575 m)  Wt 384 lb (174.181 kg)  BMI 70.22 kg/m2  SpO2 94%     Review of Systems  Musculoskeletal: Positive for back pain.  All other systems reviewed and are negative.       Objective:   Physical Exam Constitutional: She is oriented to person, place, and time. She appears well-developed and well-nourished.  Morbidly obese  HENT:  Head: Normocephalic.  Neck: Neck supple.  Musculoskeletal: She exhibits tenderness.  Neurological: She is alert and oriented to person, place, and time.  Skin: Skin is warm and dry.  Psychiatric: She has a normal mood and affect.  Symmetric normal motor tone is noted throughout. Normal muscle bulk. Muscle testing reveals 5/5 muscle strength of the upper extremity, and 5/5 of the lower extremity. Full range of motion in upper and lower extremities. ROM of spine is restricted. Fine motor movements are normal in both hands.  Sensory is intact and symmetric to light touch, pinprick and proprioception.  DTR in the upper and lower extremity are present and symmetric 1+. No clonus is noted.  Patient arises from chair with difficulty. Wide based gait with a cane , able to stand on heels and toes . No pronator drift. Rhomberg negative.  On O2 24 hrs.        Assessment & Plan:  1. L4-5 significant spinal stenosis, 8 mm at this level; also stenosis  at L5-S1.  2. Mild gait disorder.  3. History of trochanteric bursitis, stable.  4.  Morbid obesity.  5. Bilateral knee pain consistent with osteoarthritis.  6. COPD, on O2 24 hrs.  The patient wants to d/c her Hydrocodone, she is only taking 1/4 - 1/2 of a tablet bid . She states, that her Psychiatrist has told her that this  medication can increase her depression. I advised her to take 1/2 of the tablet once a day for one week, then 1/4 of a tablet once a day for another week, and then d/c.  Epidural steroid injections did not provide release more than 2 weeks and we will not continue to repeat them.  I continue to encourage her to restart her walking program, when she feels better and do the exercises she learned from PT, which were not hurting her. Also might consider aquatic therapy, if pulmonologist would approve.  Follow up in 1 month.

## 2012-08-30 ENCOUNTER — Ambulatory Visit (HOSPITAL_COMMUNITY): Payer: Self-pay | Admitting: Psychiatry

## 2012-09-02 ENCOUNTER — Ambulatory Visit: Payer: Self-pay | Admitting: Obstetrics & Gynecology

## 2012-09-04 ENCOUNTER — Emergency Department (HOSPITAL_COMMUNITY)
Admission: EM | Admit: 2012-09-04 | Discharge: 2012-09-04 | Disposition: A | Discharge status: Home / Self Care | Payer: Medicare Other | Attending: Emergency Medicine | Admitting: Emergency Medicine

## 2012-09-04 ENCOUNTER — Encounter (HOSPITAL_COMMUNITY): Payer: Self-pay

## 2012-09-04 DIAGNOSIS — R109 Unspecified abdominal pain: Secondary | ICD-10-CM | POA: Insufficient documentation

## 2012-09-04 DIAGNOSIS — J449 Chronic obstructive pulmonary disease, unspecified: Secondary | ICD-10-CM | POA: Insufficient documentation

## 2012-09-04 DIAGNOSIS — F3289 Other specified depressive episodes: Secondary | ICD-10-CM | POA: Insufficient documentation

## 2012-09-04 DIAGNOSIS — R6883 Chills (without fever): Secondary | ICD-10-CM | POA: Insufficient documentation

## 2012-09-04 DIAGNOSIS — Z8739 Personal history of other diseases of the musculoskeletal system and connective tissue: Secondary | ICD-10-CM | POA: Insufficient documentation

## 2012-09-04 DIAGNOSIS — E119 Type 2 diabetes mellitus without complications: Secondary | ICD-10-CM | POA: Insufficient documentation

## 2012-09-04 DIAGNOSIS — J4489 Other specified chronic obstructive pulmonary disease: Secondary | ICD-10-CM | POA: Insufficient documentation

## 2012-09-04 DIAGNOSIS — I10 Essential (primary) hypertension: Secondary | ICD-10-CM | POA: Insufficient documentation

## 2012-09-04 DIAGNOSIS — K219 Gastro-esophageal reflux disease without esophagitis: Secondary | ICD-10-CM | POA: Insufficient documentation

## 2012-09-04 DIAGNOSIS — Z79899 Other long term (current) drug therapy: Secondary | ICD-10-CM | POA: Insufficient documentation

## 2012-09-04 DIAGNOSIS — N390 Urinary tract infection, site not specified: Secondary | ICD-10-CM | POA: Insufficient documentation

## 2012-09-04 DIAGNOSIS — Z87891 Personal history of nicotine dependence: Secondary | ICD-10-CM | POA: Insufficient documentation

## 2012-09-04 DIAGNOSIS — Z8709 Personal history of other diseases of the respiratory system: Secondary | ICD-10-CM | POA: Insufficient documentation

## 2012-09-04 DIAGNOSIS — R350 Frequency of micturition: Secondary | ICD-10-CM | POA: Insufficient documentation

## 2012-09-04 DIAGNOSIS — Z90710 Acquired absence of both cervix and uterus: Secondary | ICD-10-CM | POA: Insufficient documentation

## 2012-09-04 DIAGNOSIS — F329 Major depressive disorder, single episode, unspecified: Secondary | ICD-10-CM | POA: Insufficient documentation

## 2012-09-04 DIAGNOSIS — R0602 Shortness of breath: Secondary | ICD-10-CM | POA: Insufficient documentation

## 2012-09-04 DIAGNOSIS — Z8544 Personal history of malignant neoplasm of other female genital organs: Secondary | ICD-10-CM | POA: Insufficient documentation

## 2012-09-04 DIAGNOSIS — R300 Dysuria: Secondary | ICD-10-CM | POA: Insufficient documentation

## 2012-09-04 DIAGNOSIS — F4310 Post-traumatic stress disorder, unspecified: Secondary | ICD-10-CM | POA: Insufficient documentation

## 2012-09-04 LAB — URINALYSIS, ROUTINE W REFLEX MICROSCOPIC
Bilirubin Urine: NEGATIVE
Nitrite: NEGATIVE
Specific Gravity, Urine: >1.03 — ABNORMAL HIGH (ref 1.005–1.030)
pH: 6 (ref 5.0–8.0)

## 2012-09-04 LAB — URINE MICROSCOPIC-ADD ON

## 2012-09-04 MED ORDER — CEPHALEXIN 500 MG PO CAPS: 500.0000 mg | ORAL_CAPSULE | Freq: Four times a day (QID) | ORAL | Status: DC

## 2012-09-04 MED ORDER — CEPHALEXIN 500 MG PO CAPS
500.0000 mg | ORAL_CAPSULE | Freq: Once | ORAL | Status: AC
  Administered 2012-09-04: 500 mg via ORAL
  Filled 2012-09-04: qty 1

## 2012-09-04 NOTE — ED Provider Notes (Signed)
History     CSN: [id number]  Arrival date & time 09/04/12  0035   First MD Initiated Contact with Patient 09/04/12 0049      Chief Complaint  Patient presents with  . Hematuria  . Abdominal Pain    Patient is a 32 y.o. female presenting with hematuria. The history is provided by the patient.  Hematuria This is a new problem. The current episode started 2 days ago. The problem occurs daily. The problem has been gradually worsening. Associated symptoms include shortness of breath. Pertinent negatives include no chest pain. Exacerbated by: urination. Nothing relieves the symptoms. She has tried rest for the symptoms. The treatment provided no relief.  pt reports hematuria, dysuria, urinary frequency and suprapubic pain for past 2 days She reports she had some abdominal pain last week, then it localized to suprapubic region with dysuria No fever/vomiting She reports chills She reports intermittent brief episodes of flank pain but none at this time She is s/p hysterectomy No other new complaints  Past Medical History  Diagnosis Date  . Fibromyalgia   . Degenerative joint disease   . Hypertension   . Sleep apnea     does not use CPAP, uses oxygen 2L around the clock  . Cancer April 2011    Uterine Cancer  . Diabetes mellitus type II   . Lumbago   . Lack of coordination   . Disturbance of skin sensation   . Chronic pain syndrome   . Pain in limb   . Spinal stenosis, lumbar region, without neurogenic claudication   . Enthesopathy of hip region   . Pain in joint, lower leg   . COPD (chronic obstructive pulmonary disease)   . Asthma   . Restrictive lung disease   . Depression   . Shortness of breath   . PTSD (post-traumatic stress disorder)   . GERD (gastroesophageal reflux disease)     Past Surgical History  Procedure Laterality Date  . Abdominal hysterectomy    . Mouth surgery      Wisdom tooth extracted  . Dilation and curettage of uterus    . Orthroscopic surgery       Right knee  . Carpel tunnel release surgery      right  . Esophagogastroduodenoscopy (egd) with esophageal dilation  05/10/2012    WUJ:[id number] dissecans-nonspecific, s/p 84F maloney    Family History  Problem Relation Age of Onset  . Bipolar disorder Brother   . Other Brother     PTSD  . Alcohol abuse Brother   . Bipolar disorder Maternal Aunt   . Anxiety disorder Maternal Aunt   . Paranoid behavior Maternal Aunt   . Colon cancer Neg Hx   . Liver disease Neg Hx   . Depression Neg Hx   . Drug abuse Neg Hx   . Schizophrenia Neg Hx   . Seizures Neg Hx   . Physical abuse Neg Hx   . Sexual abuse Mother   . Dementia Paternal Aunt   . ADD / ADHD Cousin   . ADD / ADHD Cousin   . ADD / ADHD Cousin   . ADD / ADHD Cousin     History  Substance Use Topics  . Smoking status: Former Smoker -- 0.25 packs/day    Types: Cigarettes    Quit date: 01/02/2011  . Smokeless tobacco: Never Used  . Alcohol Use: Yes     Comment: occasional wine    OB History   Grav Para Term Preterm  Abortions TAB SAB Ect Mult Living                  Review of Systems  Constitutional: Positive for chills. Negative for fever.  Respiratory: Positive for shortness of breath.        Chronic SOB (on home oxygen)   Cardiovascular: Negative for chest pain.  Gastrointestinal: Negative for vomiting.  Genitourinary: Positive for dysuria and hematuria.  Musculoskeletal: Negative for back pain.  Neurological: Negative for weakness.  Psychiatric/Behavioral: Negative for agitation.  All other systems reviewed and are negative.    Allergies  Avocado; Codeine; Flexeril; Marflex; Tramadol; Cyclobenzaprine; Other; and Voltaren  Home Medications   Current Outpatient Rx  Name  Route  Sig  Dispense  Refill  . albuterol (PROAIR HFA) 108 (90 BASE) MCG/ACT inhaler   Inhalation   Inhale 2 puffs into the lungs every 6 (six) hours as needed. For rescue         . ARIPiprazole (ABILIFY) 30 MG tablet    Oral   Take 1 tablet (30 mg total) by mouth at bedtime.   30 tablet   2   . atorvastatin (LIPITOR) 80 MG tablet   Oral   Take 80 mg by mouth every evening.          . B-D UF III MINI PEN NEEDLES 31G X 5 MM MISC               . budesonide-formoterol (SYMBICORT) 160-4.5 MCG/ACT inhaler   Inhalation   Inhale 2 puffs into the lungs 2 (two) times daily.         Marland Kitchen DETROL LA 4 MG 24 hr capsule               . DULoxetine (CYMBALTA) 30 MG capsule   Oral   Take 1 capsule (30 mg total) by mouth 2 (two) times daily.   60 capsule   2   . estrogens, conjugated, (PREMARIN) 0.45 MG tablet   Oral   Take 0.45 mg by mouth every morning.         . hydrOXYzine (VISTARIL) 50 MG capsule   Oral   Take 1 capsule (50 mg total) by mouth 3 (three) times daily as needed for anxiety.   90 capsule   2   . ketoconazole (NIZORAL) 2 % cream   Topical   Apply 1 application topically daily as needed.         Marland Kitchen lisinopril (PRINIVIL,ZESTRIL) 10 MG tablet   Oral   Take 10 mg by mouth every morning.          . metoprolol (LOPRESSOR) 50 MG tablet   Oral   Take 50 mg by mouth 2 (two) times daily.           Marland Kitchen nystatin (MYCOSTATIN) powder   Topical   Apply 1 application topically Daily. Applied to folds and under breasts as needed for irritation and rash         . omeprazole (PRILOSEC) 20 MG capsule   Oral   Take 1 capsule (20 mg total) by mouth daily before breakfast. Start when you run out of protonix   30 capsule   11   . pregabalin (LYRICA) 100 MG capsule   Oral   Take 1 capsule (100 mg total) by mouth 2 (two) times daily. To help with management of pain and anxiety.   60 capsule   1   . VICTOZA 18 MG/3ML SOLN               .  cephALEXin (KEFLEX) 500 MG capsule   Oral   Take 1 capsule (500 mg total) by mouth 4 (four) times daily.   40 capsule   0     BP 129/87  Pulse 105  Temp(Src) 98.9 F (37.2 C) (Oral)  Resp 20  Ht 5\' 2"  (1.575 m)  Wt 388 lb (175.996  kg)  BMI 70.95 kg/m2  SpO2 100%  Physical Exam CONSTITUTIONAL: Well developed/well nourished HEAD: Normocephalic/atraumatic EYES: EOMI/PERRL ENMT: Mucous membranes moist NECK: supple no meningeal signs SPINE:entire spine nontender CV: S1/S2 noted, no murmurs/rubs/gallops noted LUNGS: Lungs are clear to auscultation bilaterally, no apparent distress ABDOMEN: soft, nontender, no rebound or guarding. She is obese.  Mild suprapubic tenderness GU:no cva tenderness NEURO: Pt is awake/alert, moves all extremitiesx4, ambulatory, no distress EXTREMITIES: pulses normal, full ROM SKIN: warm, color normal PSYCH: no abnormalities of mood noted  ED Course  Procedures (including critical care time)  Labs Reviewed  URINALYSIS, ROUTINE W REFLEX MICROSCOPIC - Abnormal; Notable for the following:    APPearance CLOUDY (*)    Specific Gravity, Urine >1.030 (*)    Hgb urine dipstick LARGE (*)    Protein, ur 100 (*)    Leukocytes, UA MODERATE (*)    All other components within normal limits  URINE MICROSCOPIC-ADD ON - Abnormal; Notable for the following:    Bacteria, UA FEW (*)    All other components within normal limits  URINE CULTURE   No results found.   1. UTI (lower urinary tract infection)    Pt here with likely uti.  She is well appearing, and not septic appearing.  She has no known h/o chronic kidney disease I feel she is safe for d/c and home oral antibioitcs We discussed strict return precautions    MDM  Nursing notes including past medical history and social history reviewed and considered in documentation Labs/vital reviewed and considered         Joya Gaskins, MD 09/04/12 [phone]

## 2012-09-04 NOTE — ED Notes (Signed)
I have blood in my urine and my stomach hurts really bad, it started 2 days ago per pt. Also having chills per pt.

## 2012-09-05 LAB — URINE CULTURE

## 2012-09-09 ENCOUNTER — Encounter (HOSPITAL_COMMUNITY): Payer: Self-pay | Admitting: Psychiatry

## 2012-09-09 ENCOUNTER — Ambulatory Visit (INDEPENDENT_AMBULATORY_CARE_PROVIDER_SITE_OTHER): Payer: Medicare Other | Admitting: Psychiatry

## 2012-09-09 VITALS — Wt 378.0 lb

## 2012-09-09 DIAGNOSIS — IMO0001 Reserved for inherently not codable concepts without codable children: Secondary | ICD-10-CM

## 2012-09-09 DIAGNOSIS — G4700 Insomnia, unspecified: Secondary | ICD-10-CM

## 2012-09-09 DIAGNOSIS — F4310 Post-traumatic stress disorder, unspecified: Secondary | ICD-10-CM

## 2012-09-09 DIAGNOSIS — F418 Other specified anxiety disorders: Secondary | ICD-10-CM

## 2012-09-09 DIAGNOSIS — F323 Major depressive disorder, single episode, severe with psychotic features: Secondary | ICD-10-CM

## 2012-09-09 DIAGNOSIS — G8929 Other chronic pain: Secondary | ICD-10-CM

## 2012-09-09 DIAGNOSIS — G894 Chronic pain syndrome: Secondary | ICD-10-CM

## 2012-09-09 DIAGNOSIS — F329 Major depressive disorder, single episode, unspecified: Secondary | ICD-10-CM

## 2012-09-09 MED ORDER — PREGABALIN 100 MG PO CAPS: 100.0000 mg | ORAL_CAPSULE | Freq: Two times a day (BID) | ORAL | Status: DC

## 2012-09-09 MED ORDER — DULOXETINE HCL 30 MG PO CPEP: 30.0000 mg | ORAL_CAPSULE | Freq: Two times a day (BID) | ORAL | Status: DC

## 2012-09-09 MED ORDER — DULOXETINE HCL 30 MG PO CPEP: ORAL_CAPSULE | ORAL | Status: DC

## 2012-09-09 MED ORDER — HYDROXYZINE PAMOATE 50 MG PO CAPS: 50.0000 mg | ORAL_CAPSULE | Freq: Three times a day (TID) | ORAL | Status: DC | PRN

## 2012-09-09 MED ORDER — ARIPIPRAZOLE 30 MG PO TABS: 45.0000 mg | ORAL_TABLET | Freq: Every day | ORAL | Status: DC
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
## 2012-09-09 NOTE — Patient Instructions (Addendum)
Could use "Move Free" or "Osteo bi Flex" for arthritic pain.   The important ingredients are Chondrotin Sulfate and Glucosamine.  Tumeric is also helpful for arthritis.   Krill oil and cod liver oil may be helpful for arthritis.   [employer] is a great source for all of these.  [phone]  Glori Luis is a mushroom that has the strongest antiinflammatory properties of any substance known to mankind.  Among other sources, it can be ordered from [email or web address]  Set a timer for 8 minutes and walk for that amount of time in the house or in the yard.  Mark "8" on a calendar for that day.  Do that every day this week.  Then next week increase the time to 9 minutes and then mark the calendar with a 9 for that day.  Each week increase your exercise by one minute.  Keep a record of this so you can see what progress you are making.  Do this every day, just like eating and sleeping.  It is good for pain control, depression, and for your soul/spirit.  Bring the record in for your next visit so we can talk about your effort and how you feel with the new exercise program going and working for you.  For you I am meaning holding arms and legs off the bed for 3 breaths and then relaxing for 2 breaths, then holding up for another 3 or 4 breaths and then repeat.  Loraine Leriche the time you do this by a timer on the calendar.  Gradual increasing this is the goal.  Yoga is a very helpful exercise method.  On TV, on line, or by DVD [employer]elf[employer] Koh is a source of high quality information about yoga and videos on yoga.  Renee Ramus is the world's number one video yoga instructor according to some experts.  There are exceptional health benefits that can be achieved through yoga.  The main principles of yoga is acceptance, no competition, no comparison, and no judgement.  It is exceptional in helping people meditate and get to a very relaxed state.   Call if problems or concerns.

## 2012-09-24 ENCOUNTER — Encounter: Payer: Self-pay | Admitting: Physical Medicine and Rehabilitation

## 2012-09-24 ENCOUNTER — Encounter: Payer: Medicare Other | Attending: Neurosurgery | Admitting: Physical Medicine and Rehabilitation

## 2012-09-24 VITALS — BP 146/64 | HR 102 | Resp 16 | Ht 62.0 in | Wt 383.0 lb

## 2012-09-24 DIAGNOSIS — M48061 Spinal stenosis, lumbar region without neurogenic claudication: Secondary | ICD-10-CM | POA: Insufficient documentation

## 2012-09-24 DIAGNOSIS — M25562 Pain in left knee: Secondary | ICD-10-CM

## 2012-09-24 DIAGNOSIS — M25569 Pain in unspecified knee: Secondary | ICD-10-CM | POA: Insufficient documentation

## 2012-09-24 DIAGNOSIS — R269 Unspecified abnormalities of gait and mobility: Secondary | ICD-10-CM | POA: Insufficient documentation

## 2012-09-24 DIAGNOSIS — G8929 Other chronic pain: Secondary | ICD-10-CM

## 2012-09-24 DIAGNOSIS — IMO0001 Reserved for inherently not codable concepts without codable children: Secondary | ICD-10-CM

## 2012-09-24 MED ORDER — MELOXICAM 7.5 MG PO TABS: 7.5000 mg | ORAL_TABLET | Freq: Every day | ORAL | Status: DC

## 2012-09-24 MED ORDER — METHOCARBAMOL 500 MG PO TABS: 500.0000 mg | ORAL_TABLET | Freq: Three times a day (TID) | ORAL | Status: DC

## 2012-09-24 NOTE — Progress Notes (Signed)
Subjective:    Patient ID: [name], female    DOB: 1980/11/24, 32 y.o.   MRN: [id number]  HPI The patient is a 32 year old female, who presents with LBP and bilateral knee pain . The symptoms started over 10 years ago. The patient complains about moderate pain, which radiate to the right LE, on the posterior side, down to the back of her right knee. She describes the pain as intermittend sharp shooting pain in her right leg, and constant dull pain in her low back . Applying ice, taking medications , changing positions alleviate the symptoms. Prolonged sitting and standing aggrevates the symptoms. The patient grades her pain as a 6 /10. COPD on O2 since 8 month for 24 hr per day. Patient states that she started a yoga program, which helps some.  The patient wants to d/c her Hydrocodone, she is only taking 1/4 - 1/2 of a tablet bid . She states, that her Psychiatrist has told her that this medication can increase her depression. I advised her to take 1/2 of the tablet once a day for one week, then 1/4 of a tablet once a day for another week, and then d/c.The patient reports that she did this. She feels much better cognitively, but states, that she is in a little more pain, but she does not want to go back to taking narcotics again.   Pain Inventory Average Pain 7 Pain Right Now 9 My pain is constant, sharp, burning, stabbing and aching  In the last 24 hours, has pain interfered with the following? General activity 10 Relation with others 10 Enjoyment of life 10 What TIME of day is your pain at its worst? morning Sleep (in general) Fair  Pain is worse with: walking, bending, sitting and standing Pain improves with: rest, heat/ice, medication and injections Relief from Meds: 7  Mobility walk without assistance use a cane use a walker how many minutes can you walk? 5 ability to climb steps?  yes do you drive?  no use a wheelchair transfers alone Do you have any goals in this area?   no  Function disabled: date disabled 33 I need assistance with the following:  dressing, bathing, toileting, meal prep, household duties and shopping Do you have any goals in this area?  no  Neuro/Psych No problems in this area  Prior Studies Any changes since last visit?  no  Physicians involved in your care Any changes since last visit?  no   Family History  Problem Relation Age of Onset  . Bipolar disorder Brother   . Other Brother     PTSD  . Alcohol abuse Brother   . Bipolar disorder Maternal Aunt   . Anxiety disorder Maternal Aunt   . Paranoid behavior Maternal Aunt   . Colon cancer Neg Hx   . Liver disease Neg Hx   . Depression Neg Hx   . Drug abuse Neg Hx   . Schizophrenia Neg Hx   . Seizures Neg Hx   . Physical abuse Neg Hx   . Sexual abuse Mother   . Dementia Paternal Aunt   . ADD / ADHD Cousin   . ADD / ADHD Cousin   . ADD / ADHD Cousin   . ADD / ADHD Cousin    History   Social History  . Marital Status: Single    Spouse Name: N/A    Number of Children: 0  . Years of Education: N/A   Occupational History  .  disability    Social History Main Topics  . Smoking status: Former Smoker -- 0.25 packs/day    Types: Cigarettes    Quit date: 01/02/2011  . Smokeless tobacco: Never Used  . Alcohol Use: Yes     Comment: occasional wine  . Drug Use: No  . Sexually Active: No   Other Topics Concern  . None   Social History Narrative   Lives w/ mother & father         Past Surgical History  Procedure Laterality Date  . Abdominal hysterectomy    . Mouth surgery      Wisdom tooth extracted  . Dilation and curettage of uterus    . Orthroscopic surgery      Right knee  . Carpel tunnel release surgery      right  . Esophagogastroduodenoscopy (egd) with esophageal dilation  05/10/2012    [id number] dissecans-nonspecific, s/p 83F maloney   Past Medical History  Diagnosis Date  . Fibromyalgia   . Degenerative joint disease   . Hypertension    . Sleep apnea     does not use CPAP, uses oxygen 2L around the clock  . Cancer April 2011    Uterine Cancer  . Diabetes mellitus type II   . Lumbago   . Lack of coordination   . Disturbance of skin sensation   . Chronic pain syndrome   . Pain in limb   . Spinal stenosis, lumbar region, without neurogenic claudication   . Enthesopathy of hip region   . Pain in joint, lower leg   . COPD (chronic obstructive pulmonary disease)   . Asthma   . Restrictive lung disease   . Depression   . Shortness of breath   . PTSD (post-traumatic stress disorder)   . GERD (gastroesophageal reflux disease)    BP 146/64  Pulse 102  Resp 16  Ht 5\' 2"  (1.575 m)  Wt 383 lb (173.728 kg)  BMI 70.03 kg/m2  SpO2 95%     Review of Systems  Musculoskeletal: Positive for back pain.  All other systems reviewed and are negative.       Objective:   Physical Exam Constitutional: She is oriented to person, place, and time. She appears well-developed and well-nourished.  Morbidly obese  HENT:  Head: Normocephalic.  Neck: Neck supple.  Musculoskeletal: She exhibits tenderness.  Neurological: She is alert and oriented to person, place, and time.  Skin: Skin is warm and dry.  Psychiatric: She has a normal mood and affect.  Symmetric normal motor tone is noted throughout. Normal muscle bulk. Muscle testing reveals 5/5 muscle strength of the upper extremity, and 5/5 of the lower extremity. Full range of motion in upper and lower extremities. ROM of spine is restricted. Fine motor movements are normal in both hands.  Sensory is intact and symmetric to light touch, pinprick and proprioception.  DTR in the upper and lower extremity are present and symmetric 1+. No clonus is noted.  Patient arises from chair with difficulty. Wide based gait with a cane , able to stand on heels and toes . No pronator drift. Rhomberg negative.  On O2 24 hrs.        Assessment & Plan:  1. L4-5 significant spinal  stenosis, 8 mm at this level; also stenosis  at L5-S1.  2. Mild gait disorder.  3. History of trochanteric bursitis, stable.  4. Morbid obesity.  5. Bilateral knee pain consistent with osteoarthritis, worse on the right.  6. COPD,  on O2 24 hrs.  The patient has d/c her Hydrocodone, she states that she is in more pain now, but she does not want to go back to the narcotics. I prescribed Mobic 7.5mg  per day prn joint pain, for her bilateral knee pain, and Robaxin 500mg  , prn muscle spasms/pain, to help with her LBP and Myalgia. Patient would like to have a steroid injection into her right knee, she reports that Dr. Pamelia Hoit has suggested a fluoroscopic injection in the past, which I think would be a good idea, concerning her body habitus. I ordered this injection , the patient wanted this to be done in about 2 month.  Epidural steroid injections did not provide release more than 2 weeks and we will not continue to repeat them.  I continue to encourage her to restart her walking program, when she feels better and do the exercises she learned from PT, which were not hurting her. Also might consider aquatic therapy, if pulmonologist would approve.  Follow up with Dr. Pamelia Hoit in 2 month, for a right knee steroid injection, fluoroscopic.

## 2012-09-24 NOTE — Patient Instructions (Signed)
Try to stay as active as tolerated, please ask your pulmonologist whether you could do aquatic therapy

## 2012-09-27 ENCOUNTER — Telehealth (HOSPITAL_COMMUNITY): Payer: Self-pay | Admitting: Psychiatry

## 2012-09-27 DIAGNOSIS — G4700 Insomnia, unspecified: Secondary | ICD-10-CM

## 2012-09-27 DIAGNOSIS — F329 Major depressive disorder, single episode, unspecified: Secondary | ICD-10-CM

## 2012-09-27 NOTE — Telephone Encounter (Signed)
Called pharmacy and got them to fill the 30 mg tabs and call the pt

## 2012-10-07 ENCOUNTER — Ambulatory Visit (INDEPENDENT_AMBULATORY_CARE_PROVIDER_SITE_OTHER): Payer: Medicare Other | Admitting: Psychiatry

## 2012-10-07 ENCOUNTER — Encounter (HOSPITAL_COMMUNITY): Payer: Self-pay | Admitting: Psychiatry

## 2012-10-07 VITALS — BP 140/90 | Ht 63.0 in | Wt 388.2 lb

## 2012-10-07 DIAGNOSIS — F4310 Post-traumatic stress disorder, unspecified: Secondary | ICD-10-CM

## 2012-10-07 DIAGNOSIS — F323 Major depressive disorder, single episode, severe with psychotic features: Secondary | ICD-10-CM

## 2012-10-07 DIAGNOSIS — G894 Chronic pain syndrome: Secondary | ICD-10-CM

## 2012-10-07 DIAGNOSIS — G8929 Other chronic pain: Secondary | ICD-10-CM

## 2012-10-07 DIAGNOSIS — G4700 Insomnia, unspecified: Secondary | ICD-10-CM

## 2012-10-07 DIAGNOSIS — F4010 Social phobia, unspecified: Secondary | ICD-10-CM

## 2012-10-07 DIAGNOSIS — IMO0001 Reserved for inherently not codable concepts without codable children: Secondary | ICD-10-CM

## 2012-10-07 DIAGNOSIS — F329 Major depressive disorder, single episode, unspecified: Secondary | ICD-10-CM

## 2012-10-07 DIAGNOSIS — F418 Other specified anxiety disorders: Secondary | ICD-10-CM

## 2012-10-07 MED ORDER — DULOXETINE HCL 60 MG PO CPEP: ORAL_CAPSULE | ORAL | Status: DC

## 2012-10-07 MED ORDER — HYDROXYZINE PAMOATE 50 MG PO CAPS: 50.0000 mg | ORAL_CAPSULE | Freq: Three times a day (TID) | ORAL | Status: DC | PRN

## 2012-10-07 MED ORDER — ARIPIPRAZOLE 30 MG PO TABS: 45.0000 mg | ORAL_TABLET | Freq: Every day | ORAL | Status: DC
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
## 2012-10-07 NOTE — Patient Instructions (Addendum)
Ask Dr Juanetta Gosling about the possibly of you taking Inderal for performance anxiety.  CUT BACK/CUT OUT on sugar and carbohydrates, that means very limited fruits and starchy vegetables and very limited grains, breads  The goal is low GLYCEMIC INDEX.  CUT OUT all wheat, rye, or barley for the GLUTEN in them.  HIGH fat and LOW carbohydrate diet is the KEY.  Eat avocados, eggs, lean meat like grass fed beef and chicken  Nuts and seeds would be good foods as well.   Stevia is an excellent sweetener.  Safe for the brain.   Lowella Grip is also a good safe sweetener, not the baking blend form of Truvia  Almond butter is awesome.  Check out all this on the Internet.  Dr Heber De Soto is on the Internet with some good info about this.   http://www.drperlmutter.com is where that is.  An excellent site for info on this diet is http://paleoleap.com  [employer] makes dark chocolate that is sweetened with Stevia that is safe.  Relaxation is the ultimate solution for you.  You can seek it through tub baths, bubble baths, essential oils or incense, walking or chatting with friends, listening to soft music, watching a candle burn and just letting all thoughts go and appreciating the true essence of the Creator.  Pets or animals may be very helpful.  You might spend some time with them and then go do more directed meditation.  "I am Wishes Fulfilled Meditation" by Marylene Buerger and Lyndal Pulley may be helpful MUSIC for getting to sleep or for meditating You can order it from on line.  You might find the Chill channel on Pandora and explore the artists that you like better.   Set a timer for 8 or a certain number minutes and walk for that amount of time in the house or in the yard.  Mark the number of minutes on a calendar for that day.  Do that every day this week.  Then next week increase the time by 1 minutes and then mark the calendar with the number of minutes for that day.  Each week increase your  exercise by one minute.  Keep a record of this so you can see the progress you are making.  Do this every day, just like eating and sleeping.  It is good for pain control, depression, and for your soul/spirit.  Bring the record in for your next visit so we can talk about your effort and how you feel with the new exercise program going and working for you.  Take care of yourself.  No one else is standing up to do the job and only you know what you need.   GET SERIOUS about taking care of yourself.  Do the next right thing and that often means doing something to care for yourself along the lines of are you hungry, are you angry, are you lonely, are you tired, are you scared?  HALTS is what that stands for.  Call if problems or concerns.

## 2012-10-12 ENCOUNTER — Ambulatory Visit (INDEPENDENT_AMBULATORY_CARE_PROVIDER_SITE_OTHER): Payer: Medicare Other | Admitting: Psychiatry

## 2012-10-12 DIAGNOSIS — F411 Generalized anxiety disorder: Secondary | ICD-10-CM

## 2012-10-12 DIAGNOSIS — F331 Major depressive disorder, recurrent, moderate: Secondary | ICD-10-CM

## 2012-10-12 DIAGNOSIS — F419 Anxiety disorder, unspecified: Secondary | ICD-10-CM

## 2012-10-12 NOTE — Patient Instructions (Signed)
Discussed orally 

## 2012-10-12 NOTE — Progress Notes (Signed)
Patient:  [name]   DOB: Nov 20, 1980  MR Number: [id number]  [facility]  [address] [place],  [place], [address]  Start: Tuesday 10/12/2012 10:00 AM End: Tuesday 10/12/2012 10:50 AM  Provider/Observer:     Florencia Reasons, MSW, LCSW   Chief Complaint:      Chief Complaint  Patient presents with  . Depression  . Anxiety    Reason For Service:     The patient was referred for services by psychiatrist Dr. Lolly Mustache to address trauma history and improve coping skills. The patient has a long-standing history of symptoms of anxiety and depression beginning in adolescence with symptoms worsening in the past several years. Patient has multiple health issues and states being diagnosed with uterine cancer in March 2011 resulting in patient having a total hysterectomy in April 2011. Patient also reports a history of violent thoughts towards self and periods of rage and anger. Patient is seen for follow up appointment today.  Interventions Strategy:  Supportive therapy, cognitive behavioral therapy  Participation Level:   Active  Participation Quality:  Appropriate      Behavioral Observation:  Well Groomed, appropriate,  Current Psychosocial Factors: The patient reports her friend Hansen be going away to college in a month for a year.  Content of Session:   Reviewing symptoms, processing feelings, identifying triggers of anxiety  Current Status:   Patient reports increased anxiety, less depressed mood but stating not tired of living but tired of living the way she is. She denies having any active suicidal ideations, no plan and no intent. She continues to have significant memory difficulty and often forgets what she plans to say during today's session.  Patient Progress:   Fair. Patient reports increased anxiety about going out of the house as patient has experienced increased respiratory difficulty due to seasonal allergies related to pollen. She also reports a 10 pound  weight gain that she attributes to fluid retention. Patient also reports experiencing significant difficulty when standing as her legs start to buckle. Patient also reports increased irritability and sensitivity to noise and states not wanting to be around people as she fears how she would respond to noises and other triggers of irritability. Patient reports her friend is going away to college in a month for a year. She and her friend are committed to the relationship and have decided that we Hansen to work out things for a year. However,  patient expresses anxiety related to her friend's cousin telling her aunt with whom the friend resides that she and patient are in a homosexual relationship. The cousin has promised to keep the relationship secret bu patient thinks he Hansen disclose this information once her friend goes away to college. 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 She fears they Hansen not want her involved with the friend and that they emotionally may hurt her friend.  Therapist works with patient to process her feelings. Therapist and patient also discuss her thought patterns and effects on mood as well as behavior. Patient verbalizes that she tends to try to take care of other adults trying to avoid anyone having any emotional pain bu then states her friend is an adult and can make her own decisions as well as take care of self.    Target Goals:   1. Improve mood as evidenced by smiling more, resuming normal interest in activities, and decreasing  emotional outbursts. 2. Improve ability to set and maintain boundaries as well as assertiveness skills. 3 improve coping skills.  Last Reviewed:   04/13/2011  Goals Addressed Today:    Goals 1 and 3  Impression/Diagnosis:   Patient presents with a long-standing history of anxiety, depression, and mood swings along with periods of rage. She also has experienced  social withdrawal, crying spells and negative thoughts. Symptoms have decreased significantly in the past several months. Patient also has multiple health problems including degenerative disc disease and fibromyalgia. Diagnosis: major depressive disorder  Diagnosis:  Axis I:  Major depressive disorder, recurrent, moderate  Anxiety disorder          Axis II: Deferred

## 2012-10-14 ENCOUNTER — Telehealth (HOSPITAL_COMMUNITY): Payer: Self-pay

## 2012-10-14 DIAGNOSIS — F4010 Social phobia, unspecified: Secondary | ICD-10-CM

## 2012-10-14 MED ORDER — BUSPIRONE HCL 10 MG PO TABS: 5.0000 mg | ORAL_TABLET | Freq: Three times a day (TID) | ORAL | Status: DC

## 2012-10-14 NOTE — Addendum Note (Signed)
Addended by: Mike Craze on: 10/14/2012 02:53 PM   Modules accepted: Orders

## 2012-10-14 NOTE — Telephone Encounter (Signed)
S/W pt who reported that pulmonary doc suggested BuSpar instead of Inderal.  Will order that at 5 mg then 10 mg three times a day. eScript sent

## 2012-10-25 ENCOUNTER — Ambulatory Visit (HOSPITAL_COMMUNITY): Payer: Self-pay | Admitting: Psychiatry

## 2012-11-03 ENCOUNTER — Telehealth: Payer: Self-pay | Admitting: *Deleted

## 2012-11-03 NOTE — Telephone Encounter (Signed)
Wants to know if at her 5/28 appt with Dr Pamelia Hoit she can get a shot in both knees or willshe have to schedule with someone else?

## 2012-11-03 NOTE — Telephone Encounter (Signed)
I don't think I will have enough time to do them.

## 2012-11-04 ENCOUNTER — Encounter (HOSPITAL_COMMUNITY): Payer: Self-pay | Admitting: Psychiatry

## 2012-11-04 ENCOUNTER — Ambulatory Visit (INDEPENDENT_AMBULATORY_CARE_PROVIDER_SITE_OTHER): Payer: Medicare Other | Admitting: Psychiatry

## 2012-11-04 VITALS — BP 122/84 | HR 109 | Ht 62.0 in | Wt 391.0 lb

## 2012-11-04 DIAGNOSIS — M25561 Pain in right knee: Secondary | ICD-10-CM

## 2012-11-04 DIAGNOSIS — G894 Chronic pain syndrome: Secondary | ICD-10-CM

## 2012-11-04 DIAGNOSIS — F418 Other specified anxiety disorders: Secondary | ICD-10-CM

## 2012-11-04 DIAGNOSIS — G8929 Other chronic pain: Secondary | ICD-10-CM

## 2012-11-04 DIAGNOSIS — F329 Major depressive disorder, single episode, unspecified: Secondary | ICD-10-CM

## 2012-11-04 DIAGNOSIS — F323 Major depressive disorder, single episode, severe with psychotic features: Secondary | ICD-10-CM

## 2012-11-04 DIAGNOSIS — G4700 Insomnia, unspecified: Secondary | ICD-10-CM

## 2012-11-04 DIAGNOSIS — F4310 Post-traumatic stress disorder, unspecified: Secondary | ICD-10-CM

## 2012-11-04 DIAGNOSIS — F4010 Social phobia, unspecified: Secondary | ICD-10-CM

## 2012-11-04 DIAGNOSIS — IMO0001 Reserved for inherently not codable concepts without codable children: Secondary | ICD-10-CM

## 2012-11-04 MED ORDER — ARIPIPRAZOLE 30 MG PO TABS: 45.0000 mg | ORAL_TABLET | Freq: Every day | ORAL | Status: DC

## 2012-11-04 MED ORDER — HYDROXYZINE PAMOATE 50 MG PO CAPS: 50.0000 mg | ORAL_CAPSULE | Freq: Three times a day (TID) | ORAL | Status: DC | PRN

## 2012-11-04 MED ORDER — BUSPIRONE HCL 10 MG PO TABS: 5.0000 mg | ORAL_TABLET | Freq: Four times a day (QID) | ORAL | Status: DC

## 2012-11-04 MED ORDER — DULOXETINE HCL 60 MG PO CPEP: ORAL_CAPSULE | ORAL | Status: DC

## 2012-11-04 NOTE — Patient Instructions (Signed)
Walk in the house to music or try some crocheting or key boarding every day.  Adriana Simas again by the 4th of July  "I am Wishes [employer]" by Marylene Buerger and Lyndal Pulley may be helpful MUSIC for getting to sleep or for meditating You can order it from on line.  You might find the Chill channel on Pand[employer] and explore the artists that you like better.   Take care of yourself.  No one else is standing up to do the job and only you know what you need.   GET SERIOUS about taking care of yourself.  Do the next right thing and that often means doing something to care for yourself along the lines of are you hungry, are you angry, are you lonely, are you tired, are you scared?  HALTS is what that stands for.  Call if problems or concerns.  You will be seeing a different doctor at your next visit.  I have enjoyed working with you and feel privileged to have worked with you.
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
## 2012-11-09 ENCOUNTER — Ambulatory Visit (INDEPENDENT_AMBULATORY_CARE_PROVIDER_SITE_OTHER): Payer: Medicare Other | Admitting: Psychiatry

## 2012-11-09 DIAGNOSIS — F329 Major depressive disorder, single episode, unspecified: Secondary | ICD-10-CM

## 2012-11-09 NOTE — Progress Notes (Signed)
Patient:  [name]   DOB: 1980/09/05  MR Number: [id number]  Location: [facility]:  [address] [place],  [place], [address]  Start: Tuesday 11/09/2012 10:00 AM End: Tuesday 11/09/2012 10:50 AM  Provider/Observer:     Florencia Reasons, MSW, LCSW   Chief Complaint:      Chief Complaint  Patient presents with  . Depression  . Anxiety    Reason For Service:     The patient was referred for services by psychiatrist Dr. Lolly Mustache to address trauma history and improve coping skills. The patient has a long-standing history of symptoms of anxiety and depression beginning in adolescence with symptoms worsening in the past several years. Patient has multiple health issues and states being diagnosed with uterine cancer in March 2011 resulting in patient having a total hysterectomy in April 2011. Patient also reports a history of violent thoughts towards self and periods of rage and anger. Patient is seen for follow up appointment today.  Interventions Strategy:  Supportive therapy, cognitive behavioral therapy  Participation Level:   Active  Participation Quality:  Appropriate      Behavioral Observation:  Well Groomed, appropriate,  Current Psychosocial Factors: The patient reports increased respiratory difficulty.  Content of Session:   Reviewing symptoms, processing feelings, developing safety plan, identifying grounding techniques  Current Status:   Patient reports depressed mood, increased anxiety, and sleep difficulty. She also reports sometimes hearing a voice that sounds like hers telling her that her mother is going to get tired of taking care of her and suggesting that she take all of her medication. Patient reports being able to refrain. She reports her mother manages her medication but sometimes briefly  leaves some of the medication in the room. Therapist and patient agree that patient does not need to have access to any of her medication at this time. Therapist contacts  patient's mother via telephone during session with patient's permission and discusses safety plan for mother to manage patient's medication at all times. Mother also agrees not to leave medication in patient's room at any time. Therapist, patient, and mother also agree that patient will not drive alone at this time. Patient denies current suicidal ideations and reports no plan and no intent. She admits she has experienced a desire to cut but has refrained. Mother and patient also agreed to call this practice, call 911, or take patient to the emergency room should symptoms worsen.  Patient Progress:   Fair. Patient reports increased health issues and respiratory difficulty. She now is using 3 or 4 L of oxygen. She reports sleep difficulty expressing fear or of sleeping at night when others are asleep as she's afraid of possibly dying  in her sleep. She reports feeling more comfortable sleeping during the day as she knows her mother will constantly check on her during that time Patient also is experiencing increased pain but is hopeful of getting a cortisone shot tomorrow. She reports sometimes becoming so frustrated with her health that she wants to cut but has been able to refrain. Therapist works with patient to identify grounding techniques and encourages patient to resume some of her former activities. She expresses hurt and frustration that no one from her church has called to check on her although she's missed church for the last 3 Sundays. Patient is pleased that she recently cooked dinner for her parents. She continues to have positive feelings regarding the relationship with her friend.   Target Goals:   1. Improve mood as  evidenced by smiling more, resuming normal interest in activities, and decreasing emotional outbursts. 2. Improve ability to set and maintain boundaries as well as assertiveness skills. 3 improve coping skills.  Last Reviewed:     Goals Addressed Today:    Goals 1 and  3  Impression/Diagnosis:   Patient presents with a long-standing history of anxiety, depression, and mood swings along with periods of rage. She also has experienced social withdrawal, crying spells and negative thoughts. Symptoms have decreased significantly in the past several months. Patient also has multiple health problems including degenerative disc disease and fibromyalgia. Diagnosis: major depressive disorder  Diagnosis:  Axis I:  Major depressive disorder          Axis II: Deferred

## 2012-11-09 NOTE — Patient Instructions (Signed)
Discussed orally 

## 2012-11-10 ENCOUNTER — Encounter: Payer: Self-pay | Admitting: Physical Medicine and Rehabilitation

## 2012-11-10 ENCOUNTER — Encounter: Payer: Medicare Other | Attending: Neurosurgery | Admitting: Physical Medicine and Rehabilitation

## 2012-11-10 VITALS — BP 146/97 | HR 116 | Resp 20 | Ht 62.0 in | Wt 394.8 lb

## 2012-11-10 DIAGNOSIS — M25561 Pain in right knee: Secondary | ICD-10-CM

## 2012-11-10 DIAGNOSIS — M25569 Pain in unspecified knee: Secondary | ICD-10-CM | POA: Insufficient documentation

## 2012-11-10 DIAGNOSIS — F418 Other specified anxiety disorders: Secondary | ICD-10-CM

## 2012-11-10 DIAGNOSIS — M1710 Unilateral primary osteoarthritis, unspecified knee: Secondary | ICD-10-CM

## 2012-11-10 DIAGNOSIS — F341 Dysthymic disorder: Secondary | ICD-10-CM | POA: Insufficient documentation

## 2012-11-10 DIAGNOSIS — M1711 Unilateral primary osteoarthritis, right knee: Secondary | ICD-10-CM | POA: Insufficient documentation

## 2012-11-10 DIAGNOSIS — IMO0002 Reserved for concepts with insufficient information to code with codable children: Secondary | ICD-10-CM

## 2012-11-10 DIAGNOSIS — IMO0001 Reserved for inherently not codable concepts without codable children: Secondary | ICD-10-CM

## 2012-11-10 DIAGNOSIS — G8929 Other chronic pain: Secondary | ICD-10-CM

## 2012-11-10 MED ORDER — PREGABALIN 100 MG PO CAPS: 100.0000 mg | ORAL_CAPSULE | Freq: Two times a day (BID) | ORAL | Status: DC

## 2012-11-10 NOTE — Patient Instructions (Signed)
F/u one month for injection of left knee.  Joint Injection Care After Refer to this sheet in the next few days. These instructions provide you with information on caring for yourself after you have had a joint injection. Your caregiver also may give you more specific instructions. Your treatment has been planned according to current medical practices, but problems sometimes occur. Call your caregiver if you have any problems or questions after your procedure. After any type of joint injection, it is not uncommon to experience:  Soreness, swelling, or bruising around the injection site.  Mild numbness, tingling, or weakness around the injection site caused by the numbing medicine used before or with the injection. It also is possible to experience the following effects associated with the specific agent after injection:  Iodine-based contrast agents:  Allergic reaction (itching, hives, widespread redness, and swelling beyond the injection site).  Corticosteroids (These effects are rare.):  Allergic reaction.  Increased blood sugar levels (If you have diabetes and you notice that your blood sugar levels have increased, notify your caregiver).  Increased blood pressure levels.  Mood swings.  Hyaluronic acid in the use of viscosupplementation.  Temporary heat or redness.  Temporary rash and itching.  Increased fluid accumulation in the injected joint. These effects all should resolve within a day after your procedure.  HOME CARE INSTRUCTIONS  Limit yourself to light activity the day of your procedure. Avoid lifting heavy objects, bending, stooping, or twisting.  Take prescription or over-the-counter pain medication as directed by your caregiver.  You may apply ice to your injection site to reduce pain and swelling the day of your procedure. Ice may be applied 3-4 times:  Put ice in a plastic bag.  Place a towel between your skin and the bag.  Leave the ice on for no longer  than 15-20 minutes each time. SEEK IMMEDIATE MEDICAL CARE IF:   Pain and swelling get worse rather than better or extend beyond the injection site.  Numbness does not go away.  Blood or fluid continues to leak from the injection site.  You have chest pain.  You have swelling of your face or tongue.  You have trouble breathing or you become dizzy.  You develop a fever, chills, or severe tenderness at the injection site that last longer than 1 day. MAKE SURE YOU:  Understand these instructions.  Watch your condition.  Get help right away if you are not doing well or if you get worse. Document Released: 02/13/2011 Document Revised: 08/25/2011 Document Reviewed: 02/13/2011 [facility] Patient Information 2014 [place], [place].

## 2012-11-10 NOTE — Progress Notes (Signed)
Right Knee injection using u/s guidance for accessing knee joint with significant excess subcutaneous tissue.   Indication:Right Knee pain not relieved by medication management and other conservative care.  Informed consent was obtained after describing risks and benefits of the procedure with the patient, this includes bleeding, bruising, infection and medication side effects. The patient wishes to proceed and has given written consent. The patient was placed in a recumbent position. The lateral aspect of the knee was marked and prepped with Betadine and alcohol.   A 25 gauge 1 1/2 inch needle was inserted into the knee joint. After negative draw back for blood, a solution containing one ML of 40 mg per mL kenalog and 3 mL of 1% lidocaine were injected. The patient tolerated the procedure well. Post procedure instructions were given.  After injection patient reported 'it feels like I am walking on a cloud.  It does not feel like I have broken glass in my knee anymore."       1. L4-5 significant spinal stenosis, 8 mm at this level; also stenosis  at L5-S1.  2. Mild gait disorder.  3. History of trochanteric bursitis, stable.  4. Morbid obesity.   5. Bilateral knee pain/ OA Bilateral Knees consistent with osteoarthritis.  Injection of right knee today.     6. Opioid dependence/Narcotic Monintering:  Refill medications today Pill count was appropriate  03/11/2012 urine drug screen was consistent  No evidence of aberrant behavior noted  Cautioned regarding operating machinery and vehicles on medication  No indication or report of risk to self or others  Reports good pain control with medication  No significant side effects from pain medication appreciated.  Plan:  Lyrica to 75 milligrams twice a day, patient reports feeling better on Lyrica, reports no side effects.   F/u one month for injection of left knee.  Discontinue Voltaren gel secondary to allergy.  Recommend: Icing left  knee for 20 minutes 4 times a day, Use a walker rather than cane to help unload the knee over the next couple of weeks. Patient is not interested in knee injection or physical therapy for knees.  Will see patient back in one month

## 2012-11-17 ENCOUNTER — Ambulatory Visit: Payer: Self-pay | Admitting: Physical Medicine and Rehabilitation

## 2012-11-18 ENCOUNTER — Ambulatory Visit (INDEPENDENT_AMBULATORY_CARE_PROVIDER_SITE_OTHER): Payer: Medicare Other | Admitting: Psychiatry

## 2012-11-18 DIAGNOSIS — F329 Major depressive disorder, single episode, unspecified: Secondary | ICD-10-CM
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
## 2012-11-18 NOTE — Patient Instructions (Signed)
Discussed oraly 

## 2012-11-22 ENCOUNTER — Encounter (HOSPITAL_COMMUNITY): Payer: Self-pay | Admitting: Psychiatry

## 2012-11-22 ENCOUNTER — Ambulatory Visit (INDEPENDENT_AMBULATORY_CARE_PROVIDER_SITE_OTHER): Payer: Medicare Other | Admitting: Psychiatry

## 2012-11-22 VITALS — BP 129/90 | HR 111 | Ht 62.0 in | Wt 397.4 lb

## 2012-11-22 DIAGNOSIS — F4010 Social phobia, unspecified: Secondary | ICD-10-CM

## 2012-11-22 DIAGNOSIS — G4700 Insomnia, unspecified: Secondary | ICD-10-CM

## 2012-11-22 DIAGNOSIS — G8929 Other chronic pain: Secondary | ICD-10-CM

## 2012-11-22 DIAGNOSIS — M25569 Pain in unspecified knee: Secondary | ICD-10-CM

## 2012-11-22 DIAGNOSIS — F418 Other specified anxiety disorders: Secondary | ICD-10-CM

## 2012-11-22 DIAGNOSIS — IMO0001 Reserved for inherently not codable concepts without codable children: Secondary | ICD-10-CM

## 2012-11-22 DIAGNOSIS — F29 Unspecified psychosis not due to a substance or known physiological condition: Secondary | ICD-10-CM

## 2012-11-22 DIAGNOSIS — F341 Dysthymic disorder: Secondary | ICD-10-CM

## 2012-11-22 DIAGNOSIS — G894 Chronic pain syndrome: Secondary | ICD-10-CM

## 2012-11-22 MED ORDER — BUSPIRONE HCL 10 MG PO TABS: 5.0000 mg | ORAL_TABLET | Freq: Four times a day (QID) | ORAL | Status: DC

## 2012-11-22 MED ORDER — HYDROXYZINE PAMOATE 50 MG PO CAPS: 50.0000 mg | ORAL_CAPSULE | Freq: Three times a day (TID) | ORAL | Status: DC | PRN

## 2012-11-22 MED ORDER — DULOXETINE HCL 60 MG PO CPEP: ORAL_CAPSULE | ORAL | Status: DC

## 2012-11-22 MED ORDER — LURASIDONE HCL 40 MG PO TABS: ORAL_TABLET | ORAL | Status: DC
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
## 2012-11-22 NOTE — Patient Instructions (Addendum)
Take Latuda 20 mg(1/2 tab) at supper and Abilify 30 mg (whole tab) tonight  Tomorrow Take Latuda 40 mg (whole tab) at supper and Abilify 15 mg (1/2 tab) at night  On June 11th, the next day, take Latuda 60 mg (one and a half tabs) at supper and stop the Abilify.  If a combination works best, then you could stay there, but is will be best to get on ONE thing.  Keep Cymbalta, Vistaril, BuSpar, and Lyrica the same.  Call if problems.

## 2012-11-24 ENCOUNTER — Other Ambulatory Visit: Payer: Self-pay

## 2012-11-24 DIAGNOSIS — F418 Other specified anxiety disorders: Secondary | ICD-10-CM

## 2012-11-24 DIAGNOSIS — G8929 Other chronic pain: Secondary | ICD-10-CM

## 2012-11-24 DIAGNOSIS — IMO0001 Reserved for inherently not codable concepts without codable children: Secondary | ICD-10-CM

## 2012-11-24 MED ORDER — METHOCARBAMOL 500 MG PO TABS: 500.0000 mg | ORAL_TABLET | Freq: Three times a day (TID) | ORAL | Status: DC

## 2012-11-24 MED ORDER — MELOXICAM 7.5 MG PO TABS: 7.5000 mg | ORAL_TABLET | Freq: Every day | ORAL | Status: DC

## 2012-11-24 MED ORDER — PREGABALIN 100 MG PO CAPS: 100.0000 mg | ORAL_CAPSULE | Freq: Two times a day (BID) | ORAL | Status: DC

## 2012-11-25 ENCOUNTER — Ambulatory Visit (INDEPENDENT_AMBULATORY_CARE_PROVIDER_SITE_OTHER): Payer: Medicare Other | Admitting: Psychiatry

## 2012-11-25 DIAGNOSIS — F329 Major depressive disorder, single episode, unspecified: Secondary | ICD-10-CM

## 2012-11-25 DIAGNOSIS — F4310 Post-traumatic stress disorder, unspecified: Secondary | ICD-10-CM
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
## 2012-11-25 NOTE — Patient Instructions (Addendum)
Use mindfulness activity when eating  Complete rating form for next session

## 2012-11-29 ENCOUNTER — Encounter (HOSPITAL_COMMUNITY): Payer: Self-pay | Admitting: Psychiatry

## 2012-11-29 ENCOUNTER — Ambulatory Visit (INDEPENDENT_AMBULATORY_CARE_PROVIDER_SITE_OTHER): Payer: Medicare Other | Admitting: Psychiatry

## 2012-11-29 VITALS — BP 130/81 | HR 111 | Ht 62.5 in | Wt 389.0 lb

## 2012-11-29 DIAGNOSIS — F4010 Social phobia, unspecified: Secondary | ICD-10-CM

## 2012-11-29 DIAGNOSIS — IMO0001 Reserved for inherently not codable concepts without codable children: Secondary | ICD-10-CM

## 2012-11-29 DIAGNOSIS — F323 Major depressive disorder, single episode, severe with psychotic features: Secondary | ICD-10-CM

## 2012-11-29 DIAGNOSIS — G8929 Other chronic pain: Secondary | ICD-10-CM

## 2012-11-29 DIAGNOSIS — F29 Unspecified psychosis not due to a substance or known physiological condition: Secondary | ICD-10-CM

## 2012-11-29 DIAGNOSIS — G4700 Insomnia, unspecified: Secondary | ICD-10-CM

## 2012-11-29 DIAGNOSIS — G894 Chronic pain syndrome: Secondary | ICD-10-CM

## 2012-11-29 DIAGNOSIS — F418 Other specified anxiety disorders: Secondary | ICD-10-CM

## 2012-11-29 DIAGNOSIS — F4310 Post-traumatic stress disorder, unspecified: Secondary | ICD-10-CM

## 2012-11-29 MED ORDER — HYDROXYZINE PAMOATE 50 MG PO CAPS: 50.0000 mg | ORAL_CAPSULE | Freq: Three times a day (TID) | ORAL | Status: DC | PRN

## 2012-11-29 MED ORDER — LURASIDONE HCL 60 MG PO TABS: ORAL_TABLET | ORAL | Status: DC

## 2012-11-29 MED ORDER — DULOXETINE HCL 60 MG PO CPEP: ORAL_CAPSULE | ORAL | Status: DC

## 2012-11-29 MED ORDER — BUSPIRONE HCL 10 MG PO TABS: 10.0000 mg | ORAL_TABLET | Freq: Four times a day (QID) | ORAL | Status: DC
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
## 2012-11-29 NOTE — Patient Instructions (Signed)
Get into making those singing videos.  Call if problems or concerns.

## 2012-12-03 ENCOUNTER — Ambulatory Visit (INDEPENDENT_AMBULATORY_CARE_PROVIDER_SITE_OTHER): Payer: Medicare Other | Admitting: Psychiatry

## 2012-12-03 DIAGNOSIS — F4310 Post-traumatic stress disorder, unspecified: Secondary | ICD-10-CM

## 2012-12-03 DIAGNOSIS — F329 Major depressive disorder, single episode, unspecified: Secondary | ICD-10-CM
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
## 2012-12-03 NOTE — Patient Instructions (Signed)
Use rating sheet  Use daily schedule

## 2012-12-08 ENCOUNTER — Encounter
Payer: Medicare Other | Attending: Physical Medicine and Rehabilitation | Admitting: Physical Medicine and Rehabilitation

## 2012-12-08 ENCOUNTER — Encounter: Payer: Self-pay | Admitting: Physical Medicine and Rehabilitation

## 2012-12-08 VITALS — BP 134/73 | HR 105 | Resp 16 | Ht 62.0 in | Wt 394.0 lb

## 2012-12-08 DIAGNOSIS — M25569 Pain in unspecified knee: Secondary | ICD-10-CM | POA: Insufficient documentation

## 2012-12-08 DIAGNOSIS — M76899 Other specified enthesopathies of unspecified lower limb, excluding foot: Secondary | ICD-10-CM | POA: Insufficient documentation

## 2012-12-08 DIAGNOSIS — Z9981 Dependence on supplemental oxygen: Secondary | ICD-10-CM | POA: Insufficient documentation

## 2012-12-08 DIAGNOSIS — M545 Low back pain, unspecified: Secondary | ICD-10-CM | POA: Insufficient documentation

## 2012-12-08 DIAGNOSIS — M170 Bilateral primary osteoarthritis of knee: Secondary | ICD-10-CM

## 2012-12-08 DIAGNOSIS — G4730 Sleep apnea, unspecified: Secondary | ICD-10-CM | POA: Insufficient documentation

## 2012-12-08 DIAGNOSIS — IMO0001 Reserved for inherently not codable concepts without codable children: Secondary | ICD-10-CM

## 2012-12-08 DIAGNOSIS — M47817 Spondylosis without myelopathy or radiculopathy, lumbosacral region: Secondary | ICD-10-CM

## 2012-12-08 DIAGNOSIS — IMO0002 Reserved for concepts with insufficient information to code with codable children: Secondary | ICD-10-CM

## 2012-12-08 DIAGNOSIS — R269 Unspecified abnormalities of gait and mobility: Secondary | ICD-10-CM | POA: Insufficient documentation

## 2012-12-08 DIAGNOSIS — M48061 Spinal stenosis, lumbar region without neurogenic claudication: Secondary | ICD-10-CM | POA: Insufficient documentation

## 2012-12-08 DIAGNOSIS — J449 Chronic obstructive pulmonary disease, unspecified: Secondary | ICD-10-CM | POA: Insufficient documentation

## 2012-12-08 DIAGNOSIS — J4489 Other specified chronic obstructive pulmonary disease: Secondary | ICD-10-CM | POA: Insufficient documentation

## 2012-12-08 DIAGNOSIS — K219 Gastro-esophageal reflux disease without esophagitis: Secondary | ICD-10-CM | POA: Insufficient documentation

## 2012-12-08 DIAGNOSIS — I10 Essential (primary) hypertension: Secondary | ICD-10-CM | POA: Insufficient documentation

## 2012-12-08 DIAGNOSIS — M1710 Unilateral primary osteoarthritis, unspecified knee: Secondary | ICD-10-CM

## 2012-12-08 DIAGNOSIS — E119 Type 2 diabetes mellitus without complications: Secondary | ICD-10-CM | POA: Insufficient documentation

## 2012-12-08 NOTE — Progress Notes (Signed)
Subjective:    Patient ID: [name], female    DOB: Aug 18, 1980, 32 y.o.   MRN: [id number]  HPI The patient is a 32 year old female, who presents with LBP and bilateral knee pain . The symptoms started over 10 years ago. The patient complains about moderate pain, which radiate to the right LE, on the posterior side, down to the back of her right knee. She describes the pain as intermittend sharp shooting pain in her right leg, and constant dull pain in her low back . Applying ice, taking medications , changing positions alleviate the symptoms. Prolonged sitting and standing aggrevates the symptoms. The patient grades her pain as a 6 /10. COPD on O2 since 8 month for 24 hr per day. Patient states that she started a yoga program, which helps some.  The patient has d/c her Hydrocodone . She states, that her Psychiatrist has told her that this medication can increase her depression. . She feels much better cognitively, but states, that she is in a little more pain, but she does not want to go back to taking narcotics again.  She received a steroid injection into her right knee last month from Dr. Pamelia Hoit which helped for about 4 weeks. The patient has DM, and her BS which is not well controlled anyhow increased. Today she was scheduled for a left knee injection, but her BS was > 330 this morning, last A1C 8.8  Pain Inventory Average Pain 5 Pain Right Now 8 My pain is constant, sharp, stabbing and aching  In the last 24 hours, has pain interfered with the following? General activity 8 Relation with others 8 Enjoyment of life 8 What TIME of day is your pain at its worst? constant Sleep (in general) Fair  Pain is worse with: walking, bending, sitting and standing Pain improves with: heat/ice, medication and injections Relief from Meds: 8  Mobility walk without assistance use a cane use a walker how many minutes can you walk? 5 ability to climb steps?  yes do you drive?  no use a  wheelchair transfers alone Do you have any goals in this area?  no  Function disabled: date disabled 2010 I need assistance with the following:  feeding, dressing, bathing, toileting, meal prep and household duties Do you have any goals in this area?  no  Neuro/Psych tremor  Prior Studies Any changes since last visit?  no  Physicians involved in your care Any changes since last visit?  no   Family History  Problem Relation Age of Onset  . Bipolar disorder Brother   . Other Brother     PTSD  . Alcohol abuse Brother   . Bipolar disorder Maternal Aunt   . Anxiety disorder Maternal Aunt   . Paranoid behavior Maternal Aunt   . Colon cancer Neg Hx   . Liver disease Neg Hx   . Depression Neg Hx   . Drug abuse Neg Hx   . Schizophrenia Neg Hx   . Seizures Neg Hx   . Physical abuse Neg Hx   . Sexual abuse Mother   . Dementia Paternal Aunt   . ADD / ADHD Cousin   . ADD / ADHD Cousin   . ADD / ADHD Cousin   . ADD / ADHD Cousin    History   Social History  . Marital Status: Single    Spouse Name: N/A    Number of Children: 0  . Years of Education: N/A   Occupational History  .  disability    Social History Main Topics  . Smoking status: Former Smoker -- 0.25 packs/day    Types: Cigarettes    Quit date: 01/02/2011  . Smokeless tobacco: Never Used  . Alcohol Use: Yes     Comment: occasional wine  . Drug Use: No  . Sexually Active: No   Other Topics Concern  . None   Social History Narrative   Lives w/ mother & father         Past Surgical History  Procedure Laterality Date  . Abdominal hysterectomy    . Mouth surgery      Wisdom tooth extracted  . Dilation and curettage of uterus    . Orthroscopic surgery      Right knee  . Carpel tunnel release surgery      right  . Esophagogastroduodenoscopy (egd) with esophageal dilation  05/10/2012    [id number] dissecans-nonspecific, s/p 72F maloney   Past Medical History  Diagnosis Date  . Fibromyalgia    . Degenerative joint disease   . Hypertension   . Sleep apnea     does not use CPAP, uses oxygen 2L around the clock  . Cancer April 2011    Uterine Cancer  . Diabetes mellitus type II   . Lumbago   . Lack of coordination   . Disturbance of skin sensation   . Chronic pain syndrome   . Pain in limb   . Spinal stenosis, lumbar region, without neurogenic claudication   . Enthesopathy of hip region   . Pain in joint, lower leg   . COPD (chronic obstructive pulmonary disease)   . Asthma   . Restrictive lung disease   . Depression   . Shortness of breath   . PTSD (post-traumatic stress disorder)   . GERD (gastroesophageal reflux disease)    BP 134/73  Pulse 105  Resp 16  Ht 5\' 2"  (1.575 m)  Wt 394 lb (178.717 kg)  BMI 72.05 kg/m2  SpO2 94%     Review of Systems  Musculoskeletal: Positive for back pain and gait problem.  Neurological: Positive for tremors.  All other systems reviewed and are negative.       Objective:   Physical Exam Constitutional: She is oriented to person, place, and time. She appears well-developed and well-nourished.  Morbidly obese  HENT:  Head: Normocephalic.  Neck: Neck supple.  Musculoskeletal: She exhibits tenderness.  Neurological: She is alert and oriented to person, place, and time.  Skin: Skin is warm and dry.  Psychiatric: She has a normal mood and affect.  Symmetric normal motor tone is noted throughout. Normal muscle bulk. Muscle testing reveals 5/5 muscle strength of the upper extremity, and 5/5 of the lower extremity. Full range of motion in upper and lower extremities. ROM of spine is restricted. Fine motor movements are normal in both hands.  Sensory is intact and symmetric to light touch, pinprick and proprioception.  DTR in the upper and lower extremity are present and symmetric 1+. No clonus is noted.  Patient arises from chair with difficulty. Wide based gait with a cane , able to stand on heels and toes . No pronator drift.  Rhomberg negative.  On O2 24 hrs.        Assessment & Plan:  1. L4-5 significant spinal stenosis, 8 mm at this level; also stenosis  at L5-S1.  2. Mild gait disorder.  3. History of trochanteric bursitis, stable.  4. Morbid obesity.DM  5. Bilateral knee pain consistent with  osteoarthritis, worse on the right.  The patient has DM, and her BS which is not well controlled anyhow increased. Today she was scheduled for a left knee injection, but her BS was > 330 this morning, last A1C 8.8. I educated the patient that steroid injections can increase her BS, and with her # I will not do the injection today, because of the risk increasing her BS even further. 6. COPD, on O2 24 hrs.  Continue with Mobic 7.5mg  per day prn joint pain, for her bilateral knee pain, and Robaxin 500mg  , prn muscle spasms/pain, to help with her LBP and Myalgia.    Epidural steroid injections did not provide release more than 2 weeks and we will not continue to repeat them.  I continue to encourage her to restart her walking program, when she feels better and do the exercises she learned from PT, which were not hurting her. Also might consider aquatic therapy, if pulmonologist would approve.  Follow up in 3 month

## 2012-12-08 NOTE — Patient Instructions (Signed)
Ask your pulmonologist whether you could do aquatic PT.

## 2012-12-09 ENCOUNTER — Encounter (HOSPITAL_COMMUNITY): Payer: Self-pay | Admitting: Psychiatry

## 2012-12-09 ENCOUNTER — Ambulatory Visit (INDEPENDENT_AMBULATORY_CARE_PROVIDER_SITE_OTHER): Payer: Medicare Other | Admitting: Psychiatry

## 2012-12-09 VITALS — BP 136/81 | HR 109 | Ht 62.0 in | Wt 390.6 lb

## 2012-12-09 DIAGNOSIS — F4010 Social phobia, unspecified: Secondary | ICD-10-CM

## 2012-12-09 DIAGNOSIS — F4310 Post-traumatic stress disorder, unspecified: Secondary | ICD-10-CM

## 2012-12-09 DIAGNOSIS — G8929 Other chronic pain: Secondary | ICD-10-CM

## 2012-12-09 DIAGNOSIS — IMO0001 Reserved for inherently not codable concepts without codable children: Secondary | ICD-10-CM

## 2012-12-09 DIAGNOSIS — G894 Chronic pain syndrome: Secondary | ICD-10-CM

## 2012-12-09 DIAGNOSIS — G4700 Insomnia, unspecified: Secondary | ICD-10-CM

## 2012-12-09 DIAGNOSIS — F418 Other specified anxiety disorders: Secondary | ICD-10-CM

## 2012-12-09 DIAGNOSIS — F29 Unspecified psychosis not due to a substance or known physiological condition: Secondary | ICD-10-CM

## 2012-12-09 DIAGNOSIS — F323 Major depressive disorder, single episode, severe with psychotic features: Secondary | ICD-10-CM

## 2012-12-09 MED ORDER — BUSPIRONE HCL 10 MG PO TABS: 10.0000 mg | ORAL_TABLET | Freq: Four times a day (QID) | ORAL | Status: DC

## 2012-12-09 MED ORDER — DULOXETINE HCL 60 MG PO CPEP: ORAL_CAPSULE | ORAL | Status: DC

## 2012-12-09 MED ORDER — HYDROXYZINE PAMOATE 50 MG PO CAPS: 50.0000 mg | ORAL_CAPSULE | Freq: Three times a day (TID) | ORAL | Status: DC | PRN

## 2012-12-09 MED ORDER — LURASIDONE HCL 60 MG PO TABS: 60.0000 mg | ORAL_TABLET | Freq: Two times a day (BID) | ORAL | Status: DC

## 2012-12-09 NOTE — Patient Instructions (Addendum)
KEEP IT UP.  Splash! Splash!  In keeping with [facility]'s "BOLD NEW FUTURE" it would be very healthy for you to  CUT BACK/CUT OUT on sugar and carbohydrates, that means very limited fruits and starchy vegetables and very limited grains, breads  The goal is low GLYCEMIC INDEX.  CUT OUT all wheat, rye, or barley for the GLUTEN in them.  HIGH fat and LOW carbohydrate diet is the KEY.  Eat avocados, eggs, lean meat like grass fed beef and chicken  Nuts and seeds would be good foods as well.   Stevia is an excellent sweetener.  Safe for the brain.   Lowella Grip is also a good safe sweetener, not the baking blend form of Truvia  Almond butter is awesome.  Check out all this on the Internet.  Dr Heber [place] is on the Internet with some good info about this.   http://www.drperlmutter.com is where that is.  An excellent site for info on this diet is http://paleoleap.com  [employer] makes dark chocolate that is sweetened with Stevia that is safe.  William Dalton is a soda sweetened with Stevia and is available at [employer] among other places.  Call if problems or concerns.
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
## 2012-12-10 ENCOUNTER — Ambulatory Visit (INDEPENDENT_AMBULATORY_CARE_PROVIDER_SITE_OTHER): Payer: Medicare Other | Admitting: Psychiatry

## 2012-12-10 DIAGNOSIS — F4310 Post-traumatic stress disorder, unspecified: Secondary | ICD-10-CM

## 2012-12-10 DIAGNOSIS — F329 Major depressive disorder, single episode, unspecified: Secondary | ICD-10-CM

## 2012-12-10 NOTE — Progress Notes (Addendum)
Patient:  [name]   DOB: Jul 10, 1980  MR Number: [id number]  Location: [facility]:  [address] [place],  [place], [address]  Start: Friday 12/10/2012 11;00 AM End: Friday 12/10/2012 11:50 AM  Provider/Observer:     Florencia Reasons, MSW, LCSW   Chief Complaint:      Chief Complaint  Patient presents with  . Anxiety  . Depression    Reason For Service:     The patient was referred for services by psychiatrist Dr. Lolly Mustache to address trauma history and improve coping skills. The patient has a long-standing history of symptoms of anxiety and depression beginning in adolescence with symptoms worsening in the past several years. Patient has multiple health issues and states being diagnosed with uterine cancer in March 2011 resulting in patient having a total hysterectomy in April 2011. Patient also reports a history of violent thoughts towards self and periods of rage and anger. Patient is seen for follow up appointment today.  Interventions Strategy:  Supportive therapy, cognitive behavioral therapy  Participation Level:   Active  Participation Quality:  Appropriate      Behavioral Observation:  Well Groomed, appropriate, taklative  Current Psychosocial Factors: The patient reports recent conversation with her mother about patient's partner.  Content of Session:   Reviewing symptoms, dentifying and disputing cognitive distortions, identifying ways to improve assertiveness skills, identifying ways to increase involvement in activity  Current Status:   Patient reports improved mood, decreased anxiety, and decreased racing thoughts. She reports decreased hallucinations and but denies suicidal ideations.  Patient Progress:   Fair. Patient reports improved concentration and feeling better since last session. She reports Latuda as prescribed by Dr. Dan Humphreys has been very helpful.  Patient has been using Symptom Rating form and a daily planner. She reports this has been helpful in  identifying triggers of her emotions. Patient also has been able to develop more healthy thinking patterns and response to some of her negative thought patterns. Patient expresses relief that she and her mother talked openly this past Sunday about patient's partner. She is looking forward to visiting partner next month. Patient continues to express frustration but is experiencing decreased anger regarding mother being overprotective. She reports she isn't ready to talk with her mother about her concerns regarding their relationship as she is dependent upon mother for many of basic needs and fears that mother may be hurt by her remarks resulting  in mother possibly giving patient the silent treatment. Patient agrees to complete rating form and  daily schedule handouts for next session.    Target Goals:   1. Improve mood as evidenced by smiling more, resuming normal interest in activities, and decreasing emotional outbursts. 2. Improve ability to set and maintain boundaries as well as assertiveness skills. 3 improve coping skills.  Last Reviewed:     Goals Addressed Today:    Goals 2 and 3  Impression/Diagnosis:   Patient presents with a long-standing history of anxiety, depression, and mood swings along with periods of rage. She also has experienced social withdrawal, crying spells and negative thoughts. Symptoms have decreased significantly in the past several months. Patient also has multiple health problems including degenerative disc disease and fibromyalgia. Patient has a significant trauma history and is experiencing increased flashbacks and intrusive memories.  Diagnosis: major depressive disorder, PTSD  Diagnosis:  Axis I:  Major depressive disorder  PTSD (post-traumatic stress disorder)          Axis II: Deferred

## 2012-12-10 NOTE — Patient Instructions (Signed)
Discussed orally 

## 2012-12-16 ENCOUNTER — Ambulatory Visit (INDEPENDENT_AMBULATORY_CARE_PROVIDER_SITE_OTHER): Payer: Medicare Other | Admitting: Psychiatry

## 2012-12-16 DIAGNOSIS — F329 Major depressive disorder, single episode, unspecified: Secondary | ICD-10-CM

## 2012-12-16 NOTE — Patient Instructions (Signed)
Discussed orally 

## 2012-12-16 NOTE — Progress Notes (Signed)
Patient:  [name]   DOB: December 26, 1980  MR Number: [id number]  Location: [facility]:  [address] [place],  [place], [address]  Start: Thursday 12/16/2012 10;00 AM End: Thursday 12/16/2012 10:50 AM  Provider/Observer:     Florencia Reasons, MSW, LCSW   Chief Complaint:      Chief Complaint  Patient presents with  . Anxiety  . Depression    Reason For Service:     The patient was referred for services by psychiatrist Dr. Lolly Mustache to address trauma history and improve coping skills. The patient has a long-standing history of symptoms of anxiety and depression beginning in adolescence with symptoms worsening in the past several years. Patient has multiple health issues and states being diagnosed with uterine cancer in March 2011 resulting in patient having a total hysterectomy in April 2011. Patient also reports a history of violent thoughts towards self and periods of rage and anger. Patient is seen for follow up appointment today.  Interventions Strategy:  Supportive therapy, cognitive behavioral therapy  Participation Level:   Active  Participation Quality:  Appropriate      Behavioral Observation:  Well Groomed, appropriate, taklative  Current Psychosocial Factors: The patient reports continued marital discord between her parents  Content of Session:   Reviewing symptoms, reframing negative thoughts, problem solving, identifying ways to improve assertiveness skills, identifying ways to increase involvement in activity  Current Status:   Patient reports improved mood, decreased anxiety, and decreased racing thoughts. She denies suicidal ideations and hallucinations since last session.  Patient Progress:   Good. Patient reports continued  improved concentration and feeling better since last session. She reports Latuda as prescribed by Dr. Dan Humphreys has been very helpful.  Patient has continued to use Symptom Rating form and a daily planner. Patient's statements reflect improved  ability to identify and challenge cognitive distortions as well as improved problem solving skills. Patient remains excited about going to visit her friend on 12/28/2012. However, she reports mother is concerned about patient driving alone. Patient has decided that if this is not in her best interest to travel alone she could always ask her father to transport her for the visit. Therapist and patient discuss the possibility of taking small steps regarding going on a long trip. Patient plans to try to go to [facility] alone before next session. Therapist and patient also explore ways to increase patient's involvement in activity and productivity. Patient agrees to complete rating form and  daily schedule handouts for next session. Patient reports continued marital discord between her parents but decreased anxiety and worry as patient has been able to distance herself from the relationship by using coping statements.   .  Target Goals:   1. Improve mood as evidenced by smiling more, resuming normal interest in activities, and decreasing emotional outbursts. 2. Improve ability to set and maintain boundaries as well as assertiveness skills. 3 improve coping skills.  Last Reviewed:     Goals Addressed Today:    Goals 2 and 3  Impression/Diagnosis:   Patient presents with a long-standing history of anxiety, depression, and mood swings along with periods of rage. She also has experienced social withdrawal, crying spells and negative thoughts. Symptoms have decreased significantly in the past several months. Patient also has multiple health problems including degenerative disc disease and fibromyalgia. Patient has a significant trauma history and is experiencing increased flashbacks and intrusive memories.  Diagnosis: major depressive disorder, PTSD  Diagnosis:  Axis I:  Major depressive disorder  Axis II: Deferred

## 2012-12-22 ENCOUNTER — Ambulatory Visit (INDEPENDENT_AMBULATORY_CARE_PROVIDER_SITE_OTHER): Payer: Medicare Other | Admitting: Psychiatry

## 2012-12-22 DIAGNOSIS — F329 Major depressive disorder, single episode, unspecified: Secondary | ICD-10-CM

## 2012-12-22 NOTE — Progress Notes (Addendum)
Patient:  [name]   DOB: 01/13/1981  MR Number: [id number]  [facility]:  [address] [place] [place],  [place], [address]  Start: Wednesday 12/22/2012 9:10 AM End: Wednesday 12/22/2012 10:00 AM  Provider/Observer:     Florencia Reasons, MSW, LCSW   Chief Complaint:      Chief Complaint  Patient presents with  . Anxiety  . Depression    Reason For Service:     The patient was referred for services by psychiatrist Dr. Lolly Mustache to address trauma history and improve coping skills. The patient has a long-standing history of symptoms of anxiety and depression beginning in adolescence with symptoms worsening in the past several years. Patient has multiple health issues and states being diagnosed with uterine cancer in March 2011 resulting in patient having a total hysterectomy in April 2011. Patient also reports a history of violent thoughts towards self and periods of rage and anger. Patient is seen for follow up appointment today.  Interventions Strategy:  Supportive therapy, cognitive behavioral therapy  Participation Level:   Active  Participation Quality:  Appropriate      Behavioral Observation:  Well Groomed, appropriate, taklative  Current Psychosocial Factors:   Content of Session:   Reviewing symptoms, reinforcing patient's efforts to increase involvement in activity, discussing patient's use of coping statements  Current Status:   Patient reports continued improved mood, decreased anxiety, and decreased racing thoughts. She denies suicidal ideations and hallucinations since last session.  Patient Progress:   Good. Patient reports continued  improved mood and increased involvement in activity. She has tried to increase physical activity by walking through her house for 5 minute intervals. She also has been  more engaged socially including attending a baby shower and singing along with playing the keyboard for immediate and extended family during the Fourth of July  holiday. Patient is pleased with her progress and states loving living. She also states realizing everything doesn't go her way but " I'm changing, learning how to adapt, and going with the flow instead of thinking my world is over when the flow is interrupted". Patient has been consistent completing her homework assignments for the past 3 sessions. Patient denies having any suicidal ideations, any thoughts of self-harm, and any hallucinations for the past 2 1/2 to 3 weeks. Patient and therapist discussed patient's plans to visit her friend in [place] next week. Therapist calls patient's mother during session at patient's request regarding patient visiting friend. Therapist shared with mother that patient denies having any suicidal ideations, any thoughts of self-harm, and any hallucinations for the last 2-1/2-3 weeks and that patient seems to be doing well at this time.  .  Target Goals:   1. Improve mood as evidenced by smiling more, resuming normal interest in activities, and decreasing emotional outbursts. 2. Improve ability to set and maintain boundaries as well as assertiveness skills. 3 improve coping skills.  Last Reviewed:     Goals Addressed Today:    Goals 2 and 3  Impression/Diagnosis:   Patient presents with a long-standing history of anxiety, depression, and mood swings along with periods of rage. She also has experienced social withdrawal, crying spells and negative thoughts. Symptoms have decreased significantly in the past several months. Patient also has multiple health problems including degenerative disc disease and fibromyalgia. Patient has a significant trauma history and has experienced flashbacks and intrusive memories.  Diagnosis: major depressive disorder, PTSD  Diagnosis:  Axis I:  Major depressive disorder  Axis II: Deferred

## 2012-12-22 NOTE — Patient Instructions (Addendum)
Discussed orally 

## 2012-12-27 ENCOUNTER — Ambulatory Visit (INDEPENDENT_AMBULATORY_CARE_PROVIDER_SITE_OTHER): Payer: Medicare Other | Admitting: Psychiatry

## 2012-12-27 DIAGNOSIS — F329 Major depressive disorder, single episode, unspecified: Secondary | ICD-10-CM

## 2012-12-28 NOTE — Progress Notes (Signed)
Patient:  [name]   DOB: [birth year]  MR Number: [id number]  Location: [facility]:  [address] [place],  [place], [address]  Start: Monday 12/27/2012 3:00 PM End: Monday 12/27/2012 3:30 PM  Provider/Observer:     Florencia Reasons, MSW, LCSW   Chief Complaint:      Chief Complaint  Patient presents with  . Anxiety  . Depression    Reason For Service:     The patient was referred for services by psychiatrist Dr. Lolly Mustache to address trauma history and improve coping skills. The patient has a long-standing history of symptoms of anxiety and depression beginning in adolescence with symptoms worsening in the past several years. Patient has multiple health issues and states being diagnosed with uterine cancer in March 2011 resulting in patient having a total hysterectomy in April 2011. Patient also reports a history of violent thoughts towards self and periods of rage and anger. Patient is seen for follow up appointment today.  Interventions Strategy:  Supportive therapy, cognitive behavioral therapy  Participation Level:   Active  Participation Quality:  Appropriate      Behavioral Observation:  Well Groomed, appropriate, taklative  Current Psychosocial Factors:   Content of Session:   Reviewing symptoms, reinforcing patient's efforts to increase involvement in activity, discussing patient's use of assertiveness skills  Current Status:   Patient reports continued improved mood, decreased anxiety, and decreased racing thoughts. She denies suicidal ideations and hallucinations since last session.  Patient Progress:   Good. Patient reports continued  improved mood and increased involvement in activity. She has been busy preparing for her trip tomorrow to see her friend in [place]. Patient is very excited about trip and looking forward to seeing her friend. She also is excited about having more independence. She also reports improvement in her assertiveness skills and her  ability to set and maintain boundaries with her parents.     Target Goals:   1. Improve mood as evidenced by smiling more, resuming normal interest in activities, and decreasing emotional outbursts. 2. Improve ability to set and maintain boundaries as well as assertiveness skills. 3 improve coping skills.  Last Reviewed:     Goals Addressed Today:    Goals 2 and 3  Impression/Diagnosis:   Patient presents with a long-standing history of anxiety, depression, and mood swings along with periods of rage. She also has experienced social withdrawal, crying spells and negative thoughts. Symptoms have decreased significantly in the past several months. Patient also has multiple health problems including degenerative disc disease and fibromyalgia. Patient has a significant trauma history and has experienced flashbacks and intrusive memories.  Diagnosis: major depressive disorder, PTSD  Diagnosis:  Axis I:  Major depressive disorder          Axis II: Deferred

## 2012-12-28 NOTE — Patient Instructions (Signed)
Discussed orally 

## 2013-01-03 ENCOUNTER — Ambulatory Visit (INDEPENDENT_AMBULATORY_CARE_PROVIDER_SITE_OTHER): Payer: Medicare Other | Admitting: Psychiatry

## 2013-01-03 DIAGNOSIS — F329 Major depressive disorder, single episode, unspecified: Secondary | ICD-10-CM

## 2013-01-03 NOTE — Progress Notes (Signed)
Patient:  [name]   DOB: [birth year]  MR Number: [id number]  Location: [facility]:  [address] [place],  [place], [address]  Start: Monday 01/03/2013 2:00 PM End: Monday 01/03/2013 2:50 PM  Provider/Observer:     Florencia Reasons, MSW, LCSW   Chief Complaint:      Chief Complaint  Patient presents with  . Anxiety    Reason For Service:     The patient was referred for services by psychiatrist Dr. Lolly Mustache to address trauma history and improve coping skills. The patient has a long-standing history of symptoms of anxiety and depression beginning in adolescence with symptoms worsening in the past several years. Patient has multiple health issues and states being diagnosed with uterine cancer in March 2011 resulting in patient having a total hysterectomy in April 2011. Patient also reports a history of violent thoughts towards self and periods of rage and anger. Patient is seen for follow up appointment today.  Interventions Strategy:  Supportive therapy, cognitive behavioral therapy  Participation Level:   Active  Participation Quality:  Appropriate      Behavioral Observation:  Well Groomed, appropriate, talkative  Current Psychosocial Factors:   Content of Session:   Reviewing symptoms, reinforcing patient's efforts to increase involvement in activity, discussing patient's use of assertiveness skills  Current Status:   Patient reports continued improved mood, decreased anxiety, and decreased racing thoughts. She denies suicidal ideations and hallucinations since last session.  Patient Progress:   Good. Patient reports continued  improved mood and increased involvement in activity. She reports enjoying recent trip to see her friend. Patient reports continued involvement in activity after trip. She initiated contact with another friend and went out to dinner and a movie. Patient also has continued to try to increase physical activity by walking. Patient is more enthusiastic  about her future and her health. She states wanting to do whatever is necessary to improve her health. She reports she is beginning to reconsider having weight loss of surgery. Therapist and patient also discuss the possibility of patient contacting a nutritionist  for assistance in developing an eating plan.     Target Goals:   1. Improve mood as evidenced by smiling more, resuming normal interest in activities, and decreasing emotional outbursts. 2. Improve ability to set and maintain boundaries as well as assertiveness skills. 3 improve coping skills.  Last Reviewed:     Goals Addressed Today:    Goals 1,2 and 3  Impression/Diagnosis:   Patient presents with a long-standing history of anxiety, depression, and mood swings along with periods of rage. She also has experienced social withdrawal, crying spells and negative thoughts. Symptoms have decreased significantly in the past several months. Patient also has multiple health problems including degenerative disc disease and fibromyalgia. Patient has a significant trauma history and has experienced flashbacks and intrusive memories.  Diagnosis: major depressive disorder, PTSD  Diagnosis:  Axis I:  Major depressive disorder          Axis II: Deferred

## 2013-01-03 NOTE — Patient Instructions (Signed)
Discussed orally 

## 2013-01-10 ENCOUNTER — Ambulatory Visit (INDEPENDENT_AMBULATORY_CARE_PROVIDER_SITE_OTHER): Payer: Medicare Other | Admitting: Psychiatry

## 2013-01-10 DIAGNOSIS — F329 Major depressive disorder, single episode, unspecified: Secondary | ICD-10-CM

## 2013-01-10 NOTE — Progress Notes (Signed)
Patient:  [name]   DOB: [birth year]  MR Number: [id number]  Location: [facility]:  [address] [place],  [place], [address]  Start: Monday 01/10/2013 2:00 PM End: Monday 01/10/2013 2:30 PM  Provider/Observer:     Florencia Reasons, MSW, LCSW   Chief Complaint:      Chief Complaint  Patient presents with  . Anxiety    Reason For Service:     The patient was referred for services by psychiatrist Dr. Lolly Mustache to address trauma history and improve coping skills. The patient has a long-standing history of symptoms of anxiety and depression beginning in adolescence with symptoms worsening in the past several years. Patient has multiple health issues and states being diagnosed with uterine cancer in March 2011 resulting in patient having a total hysterectomy in April 2011. Patient also reports a history of violent thoughts towards self and periods of rage and anger. Patient is seen for follow up appointment today.  Interventions Strategy:  Supportive therapy, cognitive behavioral therapy  Participation Level:   Active  Participation Quality:  Appropriate      Behavioral Observation:  Well Groomed, appropriate, talkative  Current Psychosocial Factors:   Content of Session:   Reviewing symptoms, reinforcing patient's efforts to increase involvement in activity, reviewing treatment plan  Current Status:   Patient reports continued improved mood, decreased anxiety, and decreased racing thoughts. She denies suicidal ideations and hallucinations since last session.  Patient Progress:   Good. Patient reports continued  improved mood. However, she reports decreased involvement in activity due to pain in her left leg. However, patient is optimistic regarding her outlook on life. She is excited about her friend visiting with her for a couple of weeks beginning  August 9. Therapist and patient review treatment plan. Patient is pleased with her progress. She continues to struggle with being  an assertive and setting and maintaining boundaries with her mother the patient also reports difficulty having interest in activities partially due to to her pain.    Target Goals:   1. Improve mood resuming normal interest in activities.  2. Improve ability to set and maintain boundaries as well as assertiveness skills. 3 improve coping skills.  Last Reviewed:   01/10/2013  Goals Addressed Today:    Goals 1,2 and 3  Impression/Diagnosis:   Patient presents with a long-standing history of anxiety, depression, and mood swings along with periods of rage. She also has experienced social withdrawal, crying spells and negative thoughts. Symptoms have decreased significantly in the past several months. Patient also has multiple health problems including degenerative disc disease and fibromyalgia. Patient has a significant trauma history and has experienced flashbacks and intrusive memories.  Diagnosis: major depressive disorder, PTSD  Diagnosis:  Axis I:  Major depressive disorder          Axis II: Deferred

## 2013-01-10 NOTE — Patient Instructions (Signed)
Discussed orally 

## 2013-01-14 ENCOUNTER — Telehealth: Payer: Self-pay | Admitting: *Deleted

## 2013-01-14 NOTE — Telephone Encounter (Signed)
Calling to follow up status of  back brace form faxed to [place]. Please return with copy of last note to support dx to fx # [phone]

## 2013-01-24 ENCOUNTER — Encounter (HOSPITAL_COMMUNITY): Payer: Self-pay | Admitting: Emergency Medicine

## 2013-01-24 ENCOUNTER — Ambulatory Visit (HOSPITAL_COMMUNITY): Payer: Self-pay | Admitting: Psychiatry

## 2013-01-24 ENCOUNTER — Emergency Department (HOSPITAL_COMMUNITY)
Admission: EM | Admit: 2013-01-24 | Discharge: 2013-01-24 | Disposition: A | Discharge status: Home / Self Care | Payer: Medicare Other | Attending: Emergency Medicine | Admitting: Emergency Medicine

## 2013-01-24 ENCOUNTER — Emergency Department (HOSPITAL_COMMUNITY): Payer: Medicare Other

## 2013-01-24 DIAGNOSIS — I10 Essential (primary) hypertension: Secondary | ICD-10-CM | POA: Insufficient documentation

## 2013-01-24 DIAGNOSIS — G894 Chronic pain syndrome: Secondary | ICD-10-CM | POA: Insufficient documentation

## 2013-01-24 DIAGNOSIS — R079 Chest pain, unspecified: Secondary | ICD-10-CM

## 2013-01-24 DIAGNOSIS — R110 Nausea: Secondary | ICD-10-CM | POA: Insufficient documentation

## 2013-01-24 DIAGNOSIS — Z8709 Personal history of other diseases of the respiratory system: Secondary | ICD-10-CM | POA: Insufficient documentation

## 2013-01-24 DIAGNOSIS — Z79899 Other long term (current) drug therapy: Secondary | ICD-10-CM | POA: Insufficient documentation

## 2013-01-24 DIAGNOSIS — Z8669 Personal history of other diseases of the nervous system and sense organs: Secondary | ICD-10-CM | POA: Insufficient documentation

## 2013-01-24 DIAGNOSIS — IMO0002 Reserved for concepts with insufficient information to code with codable children: Secondary | ICD-10-CM | POA: Insufficient documentation

## 2013-01-24 DIAGNOSIS — F329 Major depressive disorder, single episode, unspecified: Secondary | ICD-10-CM | POA: Insufficient documentation

## 2013-01-24 DIAGNOSIS — F4310 Post-traumatic stress disorder, unspecified: Secondary | ICD-10-CM | POA: Insufficient documentation

## 2013-01-24 DIAGNOSIS — F3289 Other specified depressive episodes: Secondary | ICD-10-CM | POA: Insufficient documentation

## 2013-01-24 DIAGNOSIS — J45901 Unspecified asthma with (acute) exacerbation: Secondary | ICD-10-CM | POA: Insufficient documentation

## 2013-01-24 DIAGNOSIS — J441 Chronic obstructive pulmonary disease with (acute) exacerbation: Secondary | ICD-10-CM | POA: Insufficient documentation

## 2013-01-24 DIAGNOSIS — M1990 Unspecified osteoarthritis, unspecified site: Secondary | ICD-10-CM | POA: Insufficient documentation

## 2013-01-24 DIAGNOSIS — G4730 Sleep apnea, unspecified: Secondary | ICD-10-CM | POA: Insufficient documentation

## 2013-01-24 DIAGNOSIS — Z87891 Personal history of nicotine dependence: Secondary | ICD-10-CM | POA: Insufficient documentation

## 2013-01-24 DIAGNOSIS — Z8542 Personal history of malignant neoplasm of other parts of uterus: Secondary | ICD-10-CM | POA: Insufficient documentation

## 2013-01-24 DIAGNOSIS — Z9981 Dependence on supplemental oxygen: Secondary | ICD-10-CM | POA: Insufficient documentation

## 2013-01-24 DIAGNOSIS — K219 Gastro-esophageal reflux disease without esophagitis: Secondary | ICD-10-CM | POA: Insufficient documentation

## 2013-01-24 DIAGNOSIS — Z8739 Personal history of other diseases of the musculoskeletal system and connective tissue: Secondary | ICD-10-CM | POA: Insufficient documentation

## 2013-01-24 LAB — BASIC METABOLIC PANEL
Calcium: 9 mg/dL (ref 8.4–10.5)
Chloride: 97 mEq/L (ref 96–112)
Creatinine, Ser: 0.87 mg/dL (ref 0.50–1.10)
GFR calc Af Amer: >90 mL/min (ref >90)
GFR calc non Af Amer: 86 mL/min — ABNORMAL LOW (ref >90)

## 2013-01-24 LAB — CBC WITH DIFFERENTIAL/PLATELET
Basophils Absolute: 0 10*3/uL (ref 0.0–0.1)
Basophils Relative: 0 % (ref 0–1)
HCT: 36.6 % (ref 36.0–46.0)
Lymphocytes Relative: 29 % (ref 12–46)
MCHC: 32 g/dL (ref 30.0–36.0)
Neutro Abs: 4.2 10*3/uL (ref 1.7–7.7)
Neutrophils Relative %: 60 % (ref 43–77)
Platelets: 253 10*3/uL (ref 150–400)
RDW: 13.7 % (ref 11.5–15.5)
WBC: 7 10*3/uL (ref 4.0–10.5)

## 2013-01-24 LAB — TROPONIN I
Troponin I: <0.3 ng/mL (ref <0.30)
Troponin I: <0.3 ng/mL (ref <0.30)

## 2013-01-24 MED ORDER — ONDANSETRON HCL 4 MG/2ML IJ SOLN
4.0000 mg | Freq: Once | INTRAMUSCULAR | Status: AC
  Administered 2013-01-24: 4 mg via INTRAVENOUS
  Filled 2013-01-24: qty 2

## 2013-01-24 MED ORDER — HYDROMORPHONE HCL PF 1 MG/ML IJ SOLN
0.5000 mg | Freq: Once | INTRAMUSCULAR | Status: AC
  Administered 2013-01-24: 0.5 mg via INTRAVENOUS
  Filled 2013-01-24: qty 1

## 2013-01-24 MED ORDER — NITROGLYCERIN 0.4 MG SL SUBL
0.4000 mg | SUBLINGUAL_TABLET | SUBLINGUAL | Status: DC | PRN
  Administered 2013-01-24: 0.4 mg via SUBLINGUAL

## 2013-01-24 MED ORDER — ASPIRIN 81 MG PO CHEW
324.0000 mg | CHEWABLE_TABLET | Freq: Once | ORAL | Status: AC
  Administered 2013-01-24: 324 mg via ORAL
  Filled 2013-01-24: qty 4

## 2013-01-24 NOTE — ED Notes (Signed)
Patient with no complaints at this time. Respirations even and unlabored. Skin warm/dry. Discharge instructions reviewed with patient at this time. Patient given opportunity to voice concerns/ask questions. IV removed per policy and band-aid applied to site. Patient discharged at this time and left Emergency Department with steady gait.  

## 2013-01-24 NOTE — ED Notes (Signed)
Pt sleeping, family at the bedside.

## 2013-01-24 NOTE — ED Notes (Signed)
Pt describes she is having shooting pain in chest, up in neck and in back of her head.

## 2013-01-24 NOTE — ED Provider Notes (Signed)
Second troponin is negative. Chest pain is similar to her pain in the past. Patient has primary care and cardiology followup. She is nontoxic appearing  Donnetta Hutching, MD 01/24/13 0930

## 2013-01-24 NOTE — ED Notes (Signed)
Pt was here with mother. Mother came in by EMS, pt's mother called her this morning and woke her. Pt was scared and hurried to hospital. Pt is on obese and on 2 -4L 02 at home, Pt stated she felt nausea when she arrived and did not say anything. Now complaining of chest pain, pressure in center.

## 2013-01-24 NOTE — ED Provider Notes (Signed)
CSN: [id number]     Arrival date & time 01/24/13  0425 History     First MD Initiated Contact with Patient 01/24/13 0430     Chief Complaint  Patient presents with  . Chest Pain    Patient is a 32 y.o. female presenting with chest pain. The history is provided by the patient.  Chest Pain Pain location:  R chest Pain quality: pressure   Radiates to: neck and head. Pain severity:  Moderate Onset quality:  Gradual Duration: while in the emergency department with mother. Timing:  Constant Progression:  Unchanged Relieved by:  None tried Worsened by:  Nothing tried Associated symptoms: nausea and shortness of breath   Associated symptoms: no fever and no weakness   pt presents for CP She was here in the ER with her mother who being seen for an unrelated complaint While in the ED, the patient experienced chest pain She reports "like lightning" and now experiencing pressure on the right side of her chest No new SOB - she reports she is chronically SOB and uses oxygen daily She has not experienced this pain recently No recent fever or illnesses She denies known h/o CAD  Past Medical History  Diagnosis Date  . Fibromyalgia   . Degenerative joint disease   . Hypertension   . Sleep apnea     does not use CPAP, uses oxygen 2L around the clock  . Cancer April 2011    Uterine Cancer  . Diabetes mellitus type II   . Lumbago   . Lack of coordination   . Disturbance of skin sensation   . Chronic pain syndrome   . Pain in limb   . Spinal stenosis, lumbar region, without neurogenic claudication   . Enthesopathy of hip region   . Pain in joint, lower leg   . COPD (chronic obstructive pulmonary disease)   . Asthma   . Restrictive lung disease   . Depression   . Shortness of breath   . PTSD (post-traumatic stress disorder)   . GERD (gastroesophageal reflux disease)    Past Surgical History  Procedure Laterality Date  . Abdominal hysterectomy    . Mouth surgery      Wisdom  tooth extracted  . Dilation and curettage of uterus    . Orthroscopic surgery      Right knee  . Carpel tunnel release surgery      right  . Esophagogastroduodenoscopy (egd) with esophageal dilation  05/10/2012    WUJ:[id number] dissecans-nonspecific, s/p 32F maloney   Family History  Problem Relation Age of Onset  . Bipolar disorder Brother   . Other Brother     PTSD  . Alcohol abuse Brother   . Bipolar disorder Maternal Aunt   . Anxiety disorder Maternal Aunt   . Paranoid behavior Maternal Aunt   . Colon cancer Neg Hx   . Liver disease Neg Hx   . Depression Neg Hx   . Drug abuse Neg Hx   . Schizophrenia Neg Hx   . Seizures Neg Hx   . Physical abuse Neg Hx   . Sexual abuse Mother   . Dementia Paternal Aunt   . ADD / ADHD Cousin   . ADD / ADHD Cousin   . ADD / ADHD Cousin   . ADD / ADHD Cousin    History  Substance Use Topics  . Smoking status: Former Smoker -- 0.25 packs/day    Types: Cigarettes    Quit date: 01/02/2011  . Smokeless  tobacco: Never Used  . Alcohol Use: Yes     Comment: occasional wine   OB History   Grav Para Term Preterm Abortions TAB SAB Ect Mult Living                 Review of Systems  Constitutional: Negative for fever.  Respiratory: Positive for shortness of breath.   Cardiovascular: Positive for chest pain.  Gastrointestinal: Positive for nausea.  Neurological: Negative for weakness.  All other systems reviewed and are negative.    Allergies  Avocado; Codeine; Flexeril; Marflex; Tramadol; Contrast media; Cyclobenzaprine; Other; and Voltaren  Home Medications   Current Outpatient Rx  Name  Route  Sig  Dispense  Refill  . albuterol (PROAIR HFA) 108 (90 BASE) MCG/ACT inhaler   Inhalation   Inhale 2 puffs into the lungs every 6 (six) hours as needed. For rescue         . atorvastatin (LIPITOR) 80 MG tablet   Oral   Take 80 mg by mouth every evening.          . B-D UF III MINI PEN NEEDLES 31G X 5 MM MISC                . budesonide-formoterol (SYMBICORT) 160-4.5 MCG/ACT inhaler   Inhalation   Inhale 2 puffs into the lungs 2 (two) times daily.         . busPIRone (BUSPAR) 10 MG tablet   Oral   Take 1 tablet (10 mg total) by mouth 4 (four) times daily. TAKE WHOLE PILL   120 tablet   2   . DETROL LA 4 MG 24 hr capsule               . DULoxetine (CYMBALTA) 60 MG capsule      Take 60 mg twice a day   60 capsule   2     New double the dose script   . estrogens, conjugated, (PREMARIN) 0.45 MG tablet   Oral   Take 0.45 mg by mouth every morning.         . furosemide (LASIX) 40 MG tablet   Oral   Take 1 tablet by mouth daily.         Marland Kitchen GLIPIZIDE XL 5 MG 24 hr tablet               . hydrOXYzine (VISTARIL) 50 MG capsule   Oral   Take 1 capsule (50 mg total) by mouth 3 (three) times daily as needed for anxiety.   90 capsule   2   . ketoconazole (NIZORAL) 2 % cream   Topical   Apply 1 application topically daily as needed.         Marland Kitchen lisinopril (PRINIVIL,ZESTRIL) 10 MG tablet   Oral   Take 10 mg by mouth every morning.          . lurasidone 60 MG TABS      Take by mouth with meals twice a day   60 tablet   2   . Lurasidone HCl (LATUDA) 60 MG TABS   Oral   Take 60 mg by mouth 2 (two) times daily before a meal.   60 tablet   2   . meloxicam (MOBIC) 7.5 MG tablet   Oral   Take 1 tablet (7.5 mg total) by mouth daily. As needed for joint pain   30 tablet   3   . methocarbamol (ROBAXIN) 500 MG tablet   Oral  Take 1 tablet (500 mg total) by mouth 3 (three) times daily.   60 tablet   3     Take as needed for muscle spasms   . metoprolol (LOPRESSOR) 50 MG tablet   Oral   Take 50 mg by mouth 2 (two) times daily.           Marland Kitchen nystatin (MYCOSTATIN) powder   Topical   Apply 1 application topically Daily. Applied to folds and under breasts as needed for irritation and rash         . omeprazole (PRILOSEC) 20 MG capsule   Oral   Take 1 capsule (20 mg total)  by mouth daily before breakfast. Start when you run out of protonix   30 capsule   11   . pregabalin (LYRICA) 100 MG capsule   Oral   Take 1 capsule (100 mg total) by mouth 2 (two) times daily. To help with management of pain and anxiety.   60 capsule   3   . VICTOZA 18 MG/3ML SOLN                BP 90/58  Pulse 102  Temp(Src) 98.4 F (36.9 C) (Oral)  Resp 22  Ht 5\' 2"  (1.575 m)  Wt 386 lb (175.088 kg)  BMI 70.58 kg/m2  SpO2 95% Physical Exam CONSTITUTIONAL: Well developed/well nourished, anxious HEAD: Normocephalic/atraumatic EYES: EOMI/PERRL ENMT: Mucous membranes moist NECK: supple no meningeal signs SPINE:entire spine nontender CV: S1/S2 noted, no murmurs/rubs/gallops noted LUNGS: Lungs are clear to auscultation bilaterally, no apparent distress ABDOMEN: soft, nontender, no rebound or guarding. She is obese GU:no cva tenderness NEURO: Pt is awake/alert, moves all extremitiesx4 EXTREMITIES: pulses normal, full ROM. symmetric Pitting edema to bilateral LE SKIN: warm, color normal PSYCH: anxious  ED Course   Procedures  5:08 AM Pt with CP while in the ER visiting mother She reports having this type of pain in the past, and has seen cardiology and has been given NTG previously She reports does not have diagnosis of coronary artery disease Cardiac workup initiated Pt currently stable 5:58 AM Pt still with pain.  She requests dilaudid as she has tolerated that in the past Will defer NTG due to low BP (SBP at 105 currently) 6:59 AM Pt resting comfortably Review of chart reveals she has been seen by cardiology for this in the past and usually resolves with NTG.  She reports she is supposed to have stress test but has not had it as of yet Will check repeat troponin.  If negative and CP is improved will be stable for d/c  MDM  Nursing notes including past medical history and social history reviewed and considered in documentation xrays reviewed and  considered Labs/vital reviewed and considered Previous records reviewed and considered    Date: 01/24/2013 0429am  Rate: 103  Rhythm: sinus tachycardia  QRS Axis: normal  Intervals: normal  ST/T Wave abnormalities: nonspecific ST changes  Conduction Disutrbances:none  Narrative Interpretation:   Old EKG Reviewed: when compared to EKG from 04/2012, only change is HR is faster        Joya Gaskins, MD 01/24/13 [phone]

## 2013-01-24 NOTE — ED Notes (Signed)
C/O nausea.  MD informed.

## 2013-01-26 ENCOUNTER — Ambulatory Visit (HOSPITAL_COMMUNITY): Payer: Self-pay | Admitting: Psychiatry

## 2013-03-03 ENCOUNTER — Encounter (HOSPITAL_COMMUNITY): Payer: Self-pay | Admitting: Psychiatry

## 2013-03-03 ENCOUNTER — Ambulatory Visit (HOSPITAL_COMMUNITY): Payer: Self-pay | Admitting: Psychiatry

## 2013-03-03 ENCOUNTER — Ambulatory Visit (INDEPENDENT_AMBULATORY_CARE_PROVIDER_SITE_OTHER): Payer: Medicare Other | Admitting: Psychiatry

## 2013-03-03 VITALS — Ht 63.0 in | Wt 393.0 lb

## 2013-03-03 DIAGNOSIS — M25569 Pain in unspecified knee: Secondary | ICD-10-CM

## 2013-03-03 DIAGNOSIS — F29 Unspecified psychosis not due to a substance or known physiological condition: Secondary | ICD-10-CM

## 2013-03-03 DIAGNOSIS — G8929 Other chronic pain: Secondary | ICD-10-CM

## 2013-03-03 DIAGNOSIS — F418 Other specified anxiety disorders: Secondary | ICD-10-CM

## 2013-03-03 DIAGNOSIS — IMO0001 Reserved for inherently not codable concepts without codable children: Secondary | ICD-10-CM

## 2013-03-03 DIAGNOSIS — F341 Dysthymic disorder: Secondary | ICD-10-CM

## 2013-03-03 DIAGNOSIS — F4010 Social phobia, unspecified: Secondary | ICD-10-CM

## 2013-03-03 DIAGNOSIS — F329 Major depressive disorder, single episode, unspecified: Secondary | ICD-10-CM

## 2013-03-03 MED ORDER — BUSPIRONE HCL 10 MG PO TABS: 10.0000 mg | ORAL_TABLET | Freq: Four times a day (QID) | ORAL | Status: DC

## 2013-03-03 MED ORDER — DULOXETINE HCL 60 MG PO CPEP: ORAL_CAPSULE | ORAL | Status: DC

## 2013-03-03 MED ORDER — LURASIDONE HCL 60 MG PO TABS: 60.0000 mg | ORAL_TABLET | Freq: Two times a day (BID) | ORAL | Status: DC

## 2013-03-03 MED ORDER — HYDROXYZINE PAMOATE 50 MG PO CAPS: 50.0000 mg | ORAL_CAPSULE | Freq: Three times a day (TID) | ORAL | Status: DC | PRN
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
## 2013-03-04 ENCOUNTER — Encounter
Payer: Medicare Other | Attending: Physical Medicine and Rehabilitation | Admitting: Physical Medicine and Rehabilitation

## 2013-03-11 ENCOUNTER — Ambulatory Visit (INDEPENDENT_AMBULATORY_CARE_PROVIDER_SITE_OTHER): Payer: Medicare Other | Admitting: Psychiatry

## 2013-03-11 DIAGNOSIS — F329 Major depressive disorder, single episode, unspecified: Secondary | ICD-10-CM

## 2013-03-11 NOTE — Progress Notes (Signed)
Patient:  [name]   DOB: 1980/09/25  MR Number: [id number]  Location: [facility]:  [address] [place],  [place], [address]  Start: Friday 03/11/2013 2:05 PM End: Friday 03/11/2013 2:50 PM  Provider/Observer:     Florencia Reasons, MSW, LCSW   Chief Complaint:      Chief Complaint  Patient presents with  . Anxiety  . Depression    Reason For Service:     The patient was referred for services by psychiatrist Dr. Lolly Mustache to address trauma history and improve coping skills. The patient has a long-standing history of symptoms of anxiety and depression beginning in adolescence with symptoms worsening in the past several years. Patient has multiple health issues and states being diagnosed with uterine cancer in March 2011 resulting in patient having a total hysterectomy in April 2011. Patient also reports a history of violent thoughts towards self and periods of rage and anger. Patient is seen for follow up appointment today.  Interventions Strategy:  Supportive therapy, cognitive behavioral therapy  Participation Level:   Active  Participation Quality:  Appropriate      Behavioral Observation:  Well Groomed, appropriate, speech is very slow, patient is having memory difficulty and articulating works she wants to say  Current Psychosocial Factors:   Content of Session:   Reviewing symptoms, processing feelings, identifying coping statements  Current Status:   Patient reports increased sadness, anxiety, and excessive sleeping.  Patient Progress:    Patient reports increased stress, anxiety, and sadness as a 34 year old woman who is a very close friend and mother figure to patient recently has been diagnosed with breast cancer and has decided not to pursue treatment. Patient also reports stress related to her uncle dying and her mother fighing his older children for custody of his younger children per his wishes. Patient also is experiencing increased pain and health issues.  Therapist works with patient to process feelings and to review coping techniques. Patient has resumed attending church and is looking forward to singing at a church service tonight. Patient reports returning to church has been helpful    Target Goals:   1. Improve mood resuming normal interest in activities.  2. Improve ability to set and maintain boundaries as well as assertiveness skills. 3 improve coping skills.  Last Reviewed:   01/10/2013  Goals Addressed Today:    Goals 1 and 3  Impression/Diagnosis:   Patient presents with a long-standing history of anxiety, depression, and mood swings along with periods of rage. She also has experienced social withdrawal, crying spells and negative thoughts. Symptoms have decreased significantly in the past several months. Patient also has multiple health problems including degenerative disc disease and fibromyalgia. Patient has a significant trauma history and has experienced flashbacks and intrusive memories.  Diagnosis: major depressive disorder, PTSD  Diagnosis:  Axis I:  Major depressive disorder          Axis II: Deferred

## 2013-03-11 NOTE — Patient Instructions (Signed)
Discussed orally 

## 2013-03-18 ENCOUNTER — Ambulatory Visit (INDEPENDENT_AMBULATORY_CARE_PROVIDER_SITE_OTHER): Payer: Medicare Other | Admitting: Psychiatry

## 2013-03-18 DIAGNOSIS — F329 Major depressive disorder, single episode, unspecified: Secondary | ICD-10-CM

## 2013-03-18 NOTE — Progress Notes (Signed)
Patient:  [name]   DOB: 1980-07-05  MR Number: [id number]  [facility]:  [address] [place],  [place], [address]  Start: Friday 03/18/2013 4:00 PM End: Friday 03/18/2013 4:45 PM  Provider/Observer:     Florencia Reasons, MSW, LCSW   Chief Complaint:      Chief Complaint  Patient presents with  . Anxiety  . Depression    Reason For Service:     The patient was referred for services by psychiatrist Dr. Lolly Mustache to address trauma history and improve coping skills. The patient has a long-standing history of symptoms of anxiety and depression beginning in adolescence with symptoms worsening in the past several years. Patient has multiple health issues and states being diagnosed with uterine cancer in March 2011 resulting in patient having a total hysterectomy in April 2011. Patient also reports a history of violent thoughts towards self and periods of rage and anger. Patient is seen for follow up appointment today and is experiencing increased anxiety and strerss  Interventions Strategy:  Supportive therapy, cognitive behavioral therapy  Participation Level:   Active  Participation Quality:  Appropriate      Behavioral Observation:  Well Groomed, appropriate, speech is very slow, patient is having memory difficulty articulating words she wants to say  Current Psychosocial Factors: Parents are having marital discord.  Content of Session:   Reviewing symptoms, processing feelings, identifying coping statements  Current Status:   Patient reports continued anxiety and excessive sleeping.  Patient Progress:    Patient reports stress and anxiety as parents are experiencing increased marital discord. Patient expresses anger regarding her father due to the way he talks to her mother. She expresses frustration that mother complains to her about father. Patient also continues to worry about mother and her legal battle to obtain custody of her brothers children. Therapist  works with patient to discuss boundary issues and to identify coping statements. Patient is excited about seeing her friend in 2 weeks and reports continued strong support from friend.     Target Goals:   1. Improve mood resuming normal interest in activities.  2. Improve ability to set and maintain boundaries as well as assertiveness skills. 3 improve coping skills.  Last Reviewed:   01/10/2013  Goals Addressed Today:    Goals 2 and 3  Impression/Diagnosis:   Patient presents with a long-standing history of anxiety, depression, and mood swings along with periods of rage. She also has experienced social withdrawal, crying spells and negative thoughts. Symptoms have decreased significantly in the past several months. Patient also has multiple health problems including degenerative disc disease and fibromyalgia. Patient has a significant trauma history and has experienced flashbacks and intrusive memories.  Diagnosis: major depressive disorder, PTSD  Diagnosis:  Axis I:  Major depressive disorder          Axis II: Deferred

## 2013-03-18 NOTE — Patient Instructions (Signed)
Discussed orally 

## 2013-03-21 ENCOUNTER — Other Ambulatory Visit: Payer: Self-pay | Admitting: *Deleted

## 2013-03-21 MED ORDER — MELOXICAM 7.5 MG PO TABS: 7.5000 mg | ORAL_TABLET | Freq: Every day | ORAL | Status: AC

## 2013-03-25 ENCOUNTER — Ambulatory Visit (INDEPENDENT_AMBULATORY_CARE_PROVIDER_SITE_OTHER): Payer: Medicare Other | Admitting: Psychiatry

## 2013-03-25 DIAGNOSIS — F329 Major depressive disorder, single episode, unspecified: Secondary | ICD-10-CM

## 2013-03-25 NOTE — Progress Notes (Signed)
Patient:  [name]   DOB: 1980/07/05  MR Number: [id number]  [facility]:  [address] [place],  [place], [address]  Start: Friday 03/25/2013 2:00 PM End: Friday 03/25/2013 2:55 PM  Provider/Observer:     Florencia Reasons, MSW, LCSW   Chief Complaint:      Chief Complaint  Patient presents with  . Anxiety  . Depression    Reason For Service:     The patient was referred for services by psychiatrist Dr. Lolly Mustache to address trauma history and improve coping skills. The patient has a long-standing history of symptoms of anxiety and depression beginning in adolescence with symptoms worsening in the past several years. Patient has multiple health issues and states being diagnosed with uterine cancer in March 2011 resulting in patient having a total hysterectomy in April 2011. Patient also reports a history of violent thoughts towards self and periods of rage and anger. Patient is seen for follow up appointment today and is experiencing increased anxiety and strerss  Interventions Strategy:  Supportive therapy, cognitive behavioral therapy  Participation Level:   Active  Participation Quality:  Appropriate      Behavioral Observation:  Well Groomed, appropriate, speech is very slow, patient is having memory difficulty articulating words she wants to say  Current Psychosocial Factors: Parents are having marital discord  Content of Session:   Reviewing symptoms, processing feelings, identifying coping statements,   Current Status:   Patient reports continued anxiety, fatigue, excessive sleeping.  Patient Progress:    Patient reports decreased stress regarding parents marriage as she has tried to set boundaries and and states deciding that she can't take on their problems. Patient has been using coping statements as well as staying in her room when parents argue. Patient also is looking forward to being away from her parents when she visits her friend next weekend.  Patient expresses increased worry about her health as she continues to experience chronic pain,  breathing difficulty, and weight issues.  She expresses frustration that doctors are focusing on her weight and pushing weight loss surgery but are not trying to get to the root of her pain. Patient states she was having pain when she wasn't overweight. Patient states feeling as though no one is listening to her . Therapist works with patient to process her feelings and to identify ways to give voice to patient's concerns and information she wants to share with her health providers.    Target Goals:   1. Improve mood resuming normal interest in activities.  2. Improve ability to set and maintain boundaries as well as assertiveness skills. 3 improve coping skills.  Last Reviewed:   01/10/2013  Goals Addressed Today:    Goals 2 and 3  Impression/Diagnosis:   Patient presents with a long-standing history of anxiety, depression, and mood swings along with periods of rage. She also has experienced social withdrawal, crying spells and negative thoughts. Symptoms have decreased significantly in the past several months. Patient also has multiple health problems including degenerative disc disease and fibromyalgia. Patient has a significant trauma history and has experienced flashbacks and intrusive memories.  Diagnosis: major depressive disorder, PTSD  Diagnosis:  Axis I:  Major depressive disorder          Axis II: Deferred

## 2013-03-25 NOTE — Patient Instructions (Signed)
Discussed orally 

## 2013-03-31 ENCOUNTER — Ambulatory Visit (INDEPENDENT_AMBULATORY_CARE_PROVIDER_SITE_OTHER): Payer: Medicare Other | Admitting: Psychiatry

## 2013-03-31 ENCOUNTER — Encounter (HOSPITAL_COMMUNITY): Payer: Self-pay | Admitting: Psychiatry

## 2013-03-31 VITALS — Ht 63.0 in | Wt 390.0 lb

## 2013-03-31 DIAGNOSIS — F4310 Post-traumatic stress disorder, unspecified: Secondary | ICD-10-CM

## 2013-03-31 DIAGNOSIS — F29 Unspecified psychosis not due to a substance or known physiological condition: Secondary | ICD-10-CM

## 2013-03-31 DIAGNOSIS — F329 Major depressive disorder, single episode, unspecified: Secondary | ICD-10-CM

## 2013-03-31 MED ORDER — CLONAZEPAM 0.5 MG PO TABS: 0.5000 mg | ORAL_TABLET | Freq: Two times a day (BID) | ORAL | Status: DC | PRN
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
## 2013-04-06 ENCOUNTER — Ambulatory Visit (INDEPENDENT_AMBULATORY_CARE_PROVIDER_SITE_OTHER): Payer: Medicare Other | Admitting: Psychiatry

## 2013-04-06 DIAGNOSIS — F329 Major depressive disorder, single episode, unspecified: Secondary | ICD-10-CM

## 2013-04-07 NOTE — Progress Notes (Addendum)
Patient:  [name]   DOB: 10/14/1980  MR Number: [id number]  Location: [facility]:  [address] [place] [place],  [place], [address]  Start: Wednesday 04/06/2013 2:00 PM End: Wednesday 04/06/2013 2:55 PM  Provider/Observer:     Florencia Reasons, MSW, LCSW   Chief Complaint:      Chief Complaint  Patient presents with  . Anxiety  . Depression    Reason For Service:     The patient was referred for services by psychiatrist Dr. Lolly Mustache to address trauma history and improve coping skills. The patient has a long-standing history of symptoms of anxiety and depression beginning in adolescence with symptoms worsening in the past several years. Patient has multiple health issues and states being diagnosed with uterine cancer in March 2011 resulting in patient having a total hysterectomy in April 2011. Patient also reports a history of violent thoughts towards self and periods of rage and anger. Patient is seen for follow up appointment today and is experiencing increased anxiety and stress.  Interventions Strategy:  Supportive therapy, cognitive behavioral therapy  Participation Level:   Active  Participation Quality:  Appropriate      Behavioral Observation:  Well Groomed, appropriate, speech is very slow, patient is having memory difficulty articulating words she wants to say  Current Psychosocial Factors: Parents are having marital discord  Content of Session:   Reviewing symptoms, processing feelings, reinforcing patient's efforts to set and maintain boundaries, identifying ways to increase activity and improve structure/daily routine  Current Status:   Patient reports continued anxiety, fatigue, excessive sleeping.  Patient Progress:    Patient reports continued efforts to set boundaries regarding her parents marital issues but still is experiencing significant anger regarding her father. Therapist works with patient to process her feelings. Patient reports having some relief from  tension at home as she visited her friend this past weekend. However, she expresses sadness and worry that her friend wants to quit school. She has begun blaming self for her friend's desire to leave school. Therapist works with patient to process feelings and to identify her thought patterns. Therapist and patient also discuss patient's friend's autonomy and responsibility for her decisions. Patient reports little to no involvement in activity when at home. Therapist works with patient to identify ways to increase activity and to improve daily structure and routine. Patient agrees to complete handouts and bring to next session.   Target Goals:   1. Improve mood resuming normal interest in activities.  2. Improve ability to set and maintain boundaries as well as assertiveness skills. 3 improve coping skills.  Last Reviewed:   01/10/2013  Goals Addressed Today:    Goals 1, 2,  and 3  Impression/Diagnosis:   Patient presents with a long-standing history of anxiety, depression, and mood swings along with periods of rage. She also has experienced social withdrawal, crying spells and negative thoughts. Symptoms have decreased significantly in the past several months. Patient also has multiple health problems including degenerative disc disease and fibromyalgia. Patient has a significant trauma history and has experienced flashbacks and intrusive memories.  Diagnosis: major depressive disorder, PTSD  Diagnosis:  Axis I:  Major depressive disorder          Axis II: Deferred

## 2013-04-07 NOTE — Patient Instructions (Signed)
Discussed orally 

## 2013-04-11 ENCOUNTER — Telehealth (HOSPITAL_COMMUNITY): Payer: Self-pay | Admitting: *Deleted

## 2013-04-11 NOTE — Telephone Encounter (Signed)
Spoke to pt-- her symptoms of nausea, vomiting and diarrhea are not typical of clonazepam. Told to stop it and if sx persist, see pcp

## 2013-04-20 ENCOUNTER — Ambulatory Visit (INDEPENDENT_AMBULATORY_CARE_PROVIDER_SITE_OTHER): Payer: Medicare Other | Admitting: Psychiatry

## 2013-04-20 DIAGNOSIS — F329 Major depressive disorder, single episode, unspecified: Secondary | ICD-10-CM

## 2013-04-20 NOTE — Progress Notes (Signed)
Patient:  [name]   DOB: February 02, 1981  MR Number: [id number]  Location: [facility]:  [address] [place] [place],  [place], [address]  Start: Wednesday 04/20/2013 3:00 PM End: Wednesday 04/20/2013 3:55 PM  Provider/Observer:     Florencia Reasons, MSW, LCSW   Chief Complaint:      Chief Complaint  Patient presents with  . Anxiety  . Depression    Reason For Service:     The patient was referred for services by psychiatrist Dr. Lolly Mustache to address trauma history and improve coping skills. The patient has a long-standing history of symptoms of anxiety and depression beginning in adolescence with symptoms worsening in the past several years. Patient has multiple health issues and states being diagnosed with uterine cancer in March 2011 resulting in patient having a total hysterectomy in April 2011. Patient also reports a history of violent thoughts towards self and periods of rage and anger. Patient is seen for follow up appointment today.  Interventions Strategy:  Supportive therapy, cognitive behavioral therapy  Participation Level:   Active  Participation Quality:  Appropriate      Behavioral Observation:  Well Groomed, appropriate, speech is very slow, patient is having memory difficulty articulating words she wants to say  Current Psychosocial Factors: Parents are having marital discord  Content of Session:   Reviewing symptoms, processing feelings, reinforcing patient's efforts to set and maintain boundaries, reinforcing patient' efforts toincrease activity and improve structure/daily routine  Current Status:   Patient reports experiencing increased crying spells, anxiety, depression, and hallucinations during a 1 week period she did not take medication due to allergic reaction to Klonopin. Per patient's report, she informed psychiatrist Dr. Tenny Craw. She  reports resuming  all medication except Klonopin about a week ago. She is feeling better today but still continues to experience  some hallucinations but denies any command hallucinations.  Patient Progress:    Patient reports increased emotionality and stress during the week she did not take any of her medication. She is feeling better today. She presents with some resolve today that she no longer is going to allow her parents' issues to cause worry as she realizes she has to take care of self and that her mother is an adult and capable. Patient reports increased involvement in activity and attending church this past Sunday. She shares that she has told her pastor about her lesbian relationship but expresses frustration with pastor's tone when he recently asked her if she and her friend were going to marry. Patient reports that her friend wants to get married. However, patient is concerned that her friend's family may disown her to due to family's religious convictions. She also continues to worry about her friend wanting to quit school.Therapist works with patient to process feelings and to identify her thought patterns. Therapist and patient also discuss patient's friend's autonomy and responsibility for her decisions.    Target Goals:   1. Improve mood resuming normal interest in activities.  2. Improve ability to set and maintain boundaries as well as assertiveness skills. 3 improve coping skills.  Last Reviewed:   01/10/2013  Goals Addressed Today:    Goals 1, 2,  and 3  Impression/Diagnosis:   Patient presents with a long-standing history of anxiety, depression, and mood swings along with periods of rage. She also has experienced social withdrawal, crying spells and negative thoughts. Symptoms have decreased significantly in the past several months. Patient also has multiple health problems including degenerative disc disease and fibromyalgia. Patient  has a significant trauma history and has experienced flashbacks and intrusive memories.  Diagnosis: major depressive disorder, PTSD  Diagnosis:  Axis I:  Major depressive  disorder          Axis II: Deferred

## 2013-04-20 NOTE — Patient Instructions (Signed)
Discussed orally 

## 2013-04-21 ENCOUNTER — Other Ambulatory Visit: Payer: Self-pay

## 2013-04-28 ENCOUNTER — Ambulatory Visit (INDEPENDENT_AMBULATORY_CARE_PROVIDER_SITE_OTHER): Payer: Medicare Other | Admitting: Psychiatry

## 2013-04-28 ENCOUNTER — Encounter (HOSPITAL_COMMUNITY): Payer: Self-pay | Admitting: Psychiatry

## 2013-04-28 VITALS — Ht 63.0 in | Wt 390.0 lb

## 2013-04-28 DIAGNOSIS — F329 Major depressive disorder, single episode, unspecified: Secondary | ICD-10-CM

## 2013-04-28 DIAGNOSIS — F4310 Post-traumatic stress disorder, unspecified: Secondary | ICD-10-CM

## 2013-04-28 DIAGNOSIS — F29 Unspecified psychosis not due to a substance or known physiological condition: Secondary | ICD-10-CM

## 2013-04-28 MED ORDER — BUSPIRONE HCL 15 MG PO TABS: 15.0000 mg | ORAL_TABLET | Freq: Three times a day (TID) | ORAL | Status: DC
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
## 2013-04-30 ENCOUNTER — Other Ambulatory Visit: Payer: Self-pay | Admitting: Gastroenterology

## 2013-05-09 ENCOUNTER — Ambulatory Visit (HOSPITAL_COMMUNITY): Payer: Self-pay | Admitting: Psychiatry

## 2013-05-10 ENCOUNTER — Ambulatory Visit (INDEPENDENT_AMBULATORY_CARE_PROVIDER_SITE_OTHER): Payer: Medicare Other | Admitting: Psychiatry

## 2013-05-10 DIAGNOSIS — F329 Major depressive disorder, single episode, unspecified: Secondary | ICD-10-CM

## 2013-05-10 NOTE — Patient Instructions (Signed)
Discussed orally 

## 2013-05-10 NOTE — Progress Notes (Signed)
Patient:  [name]   DOB: 09-Jun-1981  MR Number: [id number]  Location: [facility]:  [address] [place],  [place], [address]  Start: Tuesday 05/10/2013 3:05 PM End: Tuesday 05/10/2013 3:50 PM  Provider/Observer:     Florencia Reasons, MSW, LCSW   Chief Complaint:      Chief Complaint  Patient presents with  . Anxiety    Reason For Service:     The patient was referred for services by psychiatrist Dr. Lolly Mustache to address trauma history and improve coping skills. The patient has a long-standing history of symptoms of anxiety and depression beginning in adolescence with symptoms worsening in the past several years. Patient has multiple health issues and states being diagnosed with uterine cancer in March 2011 resulting in patient having a total hysterectomy in April 2011. Patient also reports a history of violent thoughts towards self and periods of rage and anger. Patient is seen for follow up appointment today.  Interventions Strategy:  Supportive therapy, cognitive behavioral therapy  Participation Level:   Active  Participation Quality:  Appropriate      Behavioral Observation:  Well Groomed, appropriate, speech is very slow, patient is having memory difficulty articulating words she wants to say, groggy  Current Psychosocial Factors: Parents are having marital discord  Content of Session:   Reviewing symptoms, processing feelings, reinforcing patient's efforts to set and maintain boundaries, reinforcing patient' efforts to increase activity and improve structure/daily routine  Current Status:   Patient reports improved mood, decreased anxiety, and resuming normal interest in activities.  Patient Progress:    Patient reports mother accidentally mixed up patient's medication resulting in patient taking klonopin and another ante-anxiety medication one day last week She reports another allergic reaction and feeling sedated. She reports feeling better today but still is  somewhat groggy. Patient has increased involvement in activity including attending church, sinking at her church, and going out to dinner with a friend. She reports continued issues with her parents marriage but being able to set and maintain boundaries. She continues to have a positive relationship with her girlfriend but still expresses concern regarding the girlfriend trying to quit school and get married. Therapist works with patient to process feelings.  Patient continues to report sometimes becoming overwhelmed with pain from neuropathy. Therapist works with patient to review coping techniques.    Target Goals:   1. Improve mood resuming normal interest in activities.  2. Improve ability to set and maintain boundaries as well as assertiveness skills. 3 improve coping skills.  Last Reviewed:   01/10/2013  Goals Addressed Today:    Goals 1, 2,  and 3  Impression/Diagnosis:   Patient presents with a long-standing history of anxiety, depression, and mood swings along with periods of rage. She also has experienced social withdrawal, crying spells and negative thoughts. Symptoms have decreased significantly in the past several months. Patient also has multiple health problems including degenerative disc disease and fibromyalgia. Patient has a significant trauma history and has experienced flashbacks and intrusive memories.  Diagnosis: major depressive disorder, PTSD  Diagnosis:  Axis I:  Major depressive disorder          Axis II: Deferred

## 2013-05-23 ENCOUNTER — Ambulatory Visit (HOSPITAL_COMMUNITY): Payer: Self-pay | Admitting: Psychiatry

## 2013-05-30 ENCOUNTER — Ambulatory Visit (INDEPENDENT_AMBULATORY_CARE_PROVIDER_SITE_OTHER): Payer: Medicare Other | Admitting: Psychiatry

## 2013-05-30 ENCOUNTER — Encounter (HOSPITAL_COMMUNITY): Payer: Self-pay | Admitting: Psychiatry

## 2013-05-30 VITALS — BP 130/70 | Ht 63.0 in | Wt 383.0 lb

## 2013-05-30 DIAGNOSIS — F4310 Post-traumatic stress disorder, unspecified: Secondary | ICD-10-CM

## 2013-05-30 DIAGNOSIS — F4010 Social phobia, unspecified: Secondary | ICD-10-CM

## 2013-05-30 DIAGNOSIS — F418 Other specified anxiety disorders: Secondary | ICD-10-CM

## 2013-05-30 DIAGNOSIS — F329 Major depressive disorder, single episode, unspecified: Secondary | ICD-10-CM

## 2013-05-30 DIAGNOSIS — IMO0001 Reserved for inherently not codable concepts without codable children: Secondary | ICD-10-CM

## 2013-05-30 DIAGNOSIS — G8929 Other chronic pain: Secondary | ICD-10-CM

## 2013-05-30 DIAGNOSIS — F29 Unspecified psychosis not due to a substance or known physiological condition: Secondary | ICD-10-CM

## 2013-05-30 MED ORDER — ALPRAZOLAM 0.5 MG PO TABS: 0.5000 mg | ORAL_TABLET | Freq: Three times a day (TID) | ORAL | Status: DC | PRN

## 2013-05-30 MED ORDER — DULOXETINE HCL 60 MG PO CPEP: ORAL_CAPSULE | ORAL | Status: DC

## 2013-05-30 MED ORDER — LURASIDONE HCL 60 MG PO TABS: 60.0000 mg | ORAL_TABLET | Freq: Two times a day (BID) | ORAL | Status: DC
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
## 2013-05-31 ENCOUNTER — Ambulatory Visit (INDEPENDENT_AMBULATORY_CARE_PROVIDER_SITE_OTHER): Payer: Medicare Other | Admitting: Psychiatry

## 2013-05-31 DIAGNOSIS — F329 Major depressive disorder, single episode, unspecified: Secondary | ICD-10-CM

## 2013-05-31 NOTE — Progress Notes (Signed)
Patient:  [name]   DOB: [birth year]  MR Number: [id number]  [facility]:  [address] [place],  [place], [address]  Start: Tuesday 05/31/2013 4:05 PM End: Tuesday 05/31/2013 4:55 PM  Provider/Observer:     Florencia Reasons, MSW, LCSW   Chief Complaint:      Chief Complaint  Patient presents with  . Anxiety  . Depression    Reason For Service:     The patient was referred for services by psychiatrist Dr. Lolly Mustache to address trauma history and improve coping skills. The patient has a long-standing history of symptoms of anxiety and depression beginning in adolescence with symptoms worsening in the past several years. Patient has multiple health issues and states being diagnosed with uterine cancer in March 2011 resulting in patient having a total hysterectomy in April 2011. Patient also reports a history of violent thoughts towards self and periods of rage and anger. Patient is seen for follow up appointment today.  Interventions Strategy:  Supportive therapy, cognitive behavioral therapy  Participation Level:   Active  Participation Quality:  Appropriate      Behavioral Observation:  Well Groomed, appropriate, anxious  Current Psychosocial Factors: Parents are having marital discord  Content of Session:   Reviewing symptoms, processing feelings, identifying ways to improve assertiveness skills, reviewing relaxation and coping techniques  Current Status:   Patient reports increased anxiety.  Patient Progress:    Patient reports increased stress as parents continues to have marital discord. Patient expresses frustration that her family is the way that it is especially during the holidays. She also expresses frustration that mother continues to vent , or problems with patient. Therapist works with patient to identify ways to improve assertiveness skills and to set and maintain boundaries. However, patient remains reluctant to be assertive with her mother as she  thinks mother may become upset. Patient reports she has become so stress that she has been smoking cigarettes. However, she reports seeing psychiatrist Dr. Tenny Craw yesterday and states being prescribed Xanax which has helped. Therapist also works with patient to review relaxation and coping techniques. Patient is looking forward to spending to spending 2 weeks with her friend during the Christmas holidays.   Target Goals:   1. Improve mood resuming normal interest in activities.  2. Improve ability to set and maintain boundaries as well as assertiveness skills. 3 improve coping skills.  Last Reviewed:   01/10/2013  Goals Addressed Today:    Goals 1, 2,  and 3  Impression/Diagnosis:   Patient presents with a long-standing history of anxiety, depression, and mood swings along with periods of rage. She also has experienced social withdrawal, crying spells and negative thoughts. Symptoms have decreased significantly in the past several months. Patient also has multiple health problems including degenerative disc disease and fibromyalgia. Patient has a significant trauma history and has experienced flashbacks and intrusive memories.  Diagnosis: major depressive disorder, PTSD  Diagnosis:  Axis I:  Major depressive disorder          Axis II: Deferred

## 2013-05-31 NOTE — Patient Instructions (Signed)
Discussed orally 

## 2013-06-22 ENCOUNTER — Ambulatory Visit (INDEPENDENT_AMBULATORY_CARE_PROVIDER_SITE_OTHER): Payer: Medicare Other | Admitting: Psychiatry

## 2013-06-22 DIAGNOSIS — F329 Major depressive disorder, single episode, unspecified: Secondary | ICD-10-CM
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
## 2013-06-22 NOTE — Patient Instructions (Signed)
Discussed orally 

## 2013-06-28 ENCOUNTER — Ambulatory Visit (INDEPENDENT_AMBULATORY_CARE_PROVIDER_SITE_OTHER): Payer: Medicare Other | Admitting: Psychiatry

## 2013-06-28 ENCOUNTER — Encounter (HOSPITAL_COMMUNITY): Payer: Self-pay | Admitting: Psychiatry

## 2013-06-28 VITALS — BP 130/80 | Ht 63.0 in | Wt 387.0 lb

## 2013-06-28 DIAGNOSIS — F29 Unspecified psychosis not due to a substance or known physiological condition: Secondary | ICD-10-CM

## 2013-06-28 DIAGNOSIS — F4310 Post-traumatic stress disorder, unspecified: Secondary | ICD-10-CM

## 2013-06-28 DIAGNOSIS — F329 Major depressive disorder, single episode, unspecified: Secondary | ICD-10-CM
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
## 2013-07-07 ENCOUNTER — Ambulatory Visit (INDEPENDENT_AMBULATORY_CARE_PROVIDER_SITE_OTHER): Payer: Medicare Other | Admitting: Psychiatry

## 2013-07-07 DIAGNOSIS — F329 Major depressive disorder, single episode, unspecified: Secondary | ICD-10-CM

## 2013-07-07 NOTE — Patient Instructions (Signed)
Discussed orally 
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
## 2013-07-28 ENCOUNTER — Ambulatory Visit (HOSPITAL_COMMUNITY): Payer: Self-pay | Admitting: Psychiatry

## 2013-07-28 ENCOUNTER — Telehealth (HOSPITAL_COMMUNITY): Payer: Self-pay | Admitting: *Deleted

## 2013-07-28 ENCOUNTER — Other Ambulatory Visit (HOSPITAL_COMMUNITY): Payer: Self-pay | Admitting: Psychiatry

## 2013-07-28 MED ORDER — BUSPIRONE HCL 15 MG PO TABS: 15.0000 mg | ORAL_TABLET | Freq: Three times a day (TID) | ORAL | Status: DC

## 2013-07-28 NOTE — Telephone Encounter (Signed)
done

## 2013-08-08 ENCOUNTER — Ambulatory Visit (HOSPITAL_COMMUNITY): Payer: Self-pay | Admitting: Psychiatry

## 2013-08-23 ENCOUNTER — Encounter (HOSPITAL_COMMUNITY): Payer: Self-pay | Admitting: Psychiatry

## 2013-08-23 ENCOUNTER — Ambulatory Visit (INDEPENDENT_AMBULATORY_CARE_PROVIDER_SITE_OTHER): Payer: Medicare Other | Admitting: Psychiatry

## 2013-08-23 VITALS — Ht 63.0 in | Wt 383.0 lb

## 2013-08-23 DIAGNOSIS — IMO0001 Reserved for inherently not codable concepts without codable children: Secondary | ICD-10-CM

## 2013-08-23 DIAGNOSIS — F329 Major depressive disorder, single episode, unspecified: Secondary | ICD-10-CM

## 2013-08-23 DIAGNOSIS — F4310 Post-traumatic stress disorder, unspecified: Secondary | ICD-10-CM

## 2013-08-23 DIAGNOSIS — F4010 Social phobia, unspecified: Secondary | ICD-10-CM

## 2013-08-23 DIAGNOSIS — F418 Other specified anxiety disorders: Secondary | ICD-10-CM

## 2013-08-23 DIAGNOSIS — F29 Unspecified psychosis not due to a substance or known physiological condition: Secondary | ICD-10-CM

## 2013-08-23 MED ORDER — LURASIDONE HCL 60 MG PO TABS
60.0000 mg | ORAL_TABLET | Freq: Two times a day (BID) | ORAL | Status: AC
Start: 2013-08-23 — End: ?

## 2013-08-23 MED ORDER — DULOXETINE HCL 60 MG PO CPEP: ORAL_CAPSULE | ORAL | Status: AC

## 2013-08-23 MED ORDER — ALPRAZOLAM 0.5 MG PO TABS: 0.5000 mg | ORAL_TABLET | Freq: Three times a day (TID) | ORAL | Status: AC | PRN

## 2013-08-23 MED ORDER — BUSPIRONE HCL 15 MG PO TABS: 15.0000 mg | ORAL_TABLET | Freq: Three times a day (TID) | ORAL | Status: AC
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
## 2013-08-25 ENCOUNTER — Ambulatory Visit (HOSPITAL_COMMUNITY): Payer: Self-pay | Admitting: Psychiatry

## 2013-08-25 ENCOUNTER — Encounter (HOSPITAL_COMMUNITY): Payer: Self-pay | Admitting: Psychiatry

## 2013-09-05 ENCOUNTER — Ambulatory Visit (INDEPENDENT_AMBULATORY_CARE_PROVIDER_SITE_OTHER): Payer: Medicare Other | Admitting: Psychiatry

## 2013-09-05 DIAGNOSIS — F329 Major depressive disorder, single episode, unspecified: Secondary | ICD-10-CM

## 2013-09-06 NOTE — Patient Instructions (Signed)
Discussed orally 

## 2013-09-06 NOTE — Progress Notes (Signed)
   THERAPIST PROGRESS NOTE  Session Time:  Monday 09/05/2013 4:10 PM - 5:00 PM  Participation Level: Active  Behavioral Response: Well GroomedAlert/sad  Type of Therapy: Individual Therapy  Treatment Goals addressed:      Improve mood resuming normal interest in activities.        Improve ability to set and maintain boundaries as well as assertiveness skills.        Improve coping skills  Interventions: Supportive  Summary: [name] is a 33 y.o. female who presents  with a long-standing history of anxiety, depression, and mood swings along with periods of rage. She also has experienced social withdrawal, crying spells and negative thoughts. Patient also has multiple health problems including degenerative disc disease and fibromyalgia. Patient has a significant trauma history and has experienced flashbacks and intrusive memories. 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 She also reports increased interest in weight management and states she has been doing half pushups in the bed. She has become more aware of her stressors which she identifies mainly as her parents. She reports being in her room a lot but being involved in activity such as talking to friend on skype. She reports sadness today as her 10 year old friend died on 08/30/2013 due to cancer.   Suicidal/Homicidal: No  Therapist Response: Therapist works with patient to reinforce her increased involvement in activity, her efforts to set and maintain boundaries, and her improved self-care efforts, therapist and patient discuss patient's use of coping skills and effects on patient's mood and behavior.  Plan: Return again in 3-4 weeks.  Diagnosis: Axis I: Major depressive disorder    PTSD    Axis II: Deferred    Dozier Berkovich, LCSW 09/06/2013

## 2013-09-07 ENCOUNTER — Other Ambulatory Visit: Payer: Self-pay

## 2013-09-11 ENCOUNTER — Encounter (HOSPITAL_COMMUNITY): Payer: Self-pay | Admitting: Emergency Medicine

## 2013-09-11 ENCOUNTER — Emergency Department (HOSPITAL_COMMUNITY): Payer: Medicare Other

## 2013-09-11 ENCOUNTER — Emergency Department (HOSPITAL_COMMUNITY)
Admission: EM | Admit: 2013-09-11 | Discharge: 2013-09-11 | Disposition: A | Discharge status: Home / Self Care | Payer: Medicare Other | Attending: Emergency Medicine | Admitting: Emergency Medicine

## 2013-09-11 DIAGNOSIS — F3289 Other specified depressive episodes: Secondary | ICD-10-CM | POA: Insufficient documentation

## 2013-09-11 DIAGNOSIS — R059 Cough, unspecified: Secondary | ICD-10-CM

## 2013-09-11 DIAGNOSIS — I10 Essential (primary) hypertension: Secondary | ICD-10-CM | POA: Insufficient documentation

## 2013-09-11 DIAGNOSIS — R079 Chest pain, unspecified: Secondary | ICD-10-CM

## 2013-09-11 DIAGNOSIS — G894 Chronic pain syndrome: Secondary | ICD-10-CM | POA: Insufficient documentation

## 2013-09-11 DIAGNOSIS — J441 Chronic obstructive pulmonary disease with (acute) exacerbation: Secondary | ICD-10-CM | POA: Insufficient documentation

## 2013-09-11 DIAGNOSIS — Z791 Long term (current) use of non-steroidal anti-inflammatories (NSAID): Secondary | ICD-10-CM | POA: Insufficient documentation

## 2013-09-11 DIAGNOSIS — Z79899 Other long term (current) drug therapy: Secondary | ICD-10-CM | POA: Insufficient documentation

## 2013-09-11 DIAGNOSIS — Z91040 Latex allergy status: Secondary | ICD-10-CM | POA: Insufficient documentation

## 2013-09-11 DIAGNOSIS — F329 Major depressive disorder, single episode, unspecified: Secondary | ICD-10-CM | POA: Insufficient documentation

## 2013-09-11 DIAGNOSIS — E119 Type 2 diabetes mellitus without complications: Secondary | ICD-10-CM | POA: Insufficient documentation

## 2013-09-11 DIAGNOSIS — M79609 Pain in unspecified limb: Secondary | ICD-10-CM | POA: Insufficient documentation

## 2013-09-11 DIAGNOSIS — IMO0002 Reserved for concepts with insufficient information to code with codable children: Secondary | ICD-10-CM | POA: Insufficient documentation

## 2013-09-11 DIAGNOSIS — R0789 Other chest pain: Secondary | ICD-10-CM | POA: Insufficient documentation

## 2013-09-11 DIAGNOSIS — Z8542 Personal history of malignant neoplasm of other parts of uterus: Secondary | ICD-10-CM | POA: Insufficient documentation

## 2013-09-11 DIAGNOSIS — R110 Nausea: Secondary | ICD-10-CM | POA: Insufficient documentation

## 2013-09-11 DIAGNOSIS — J45901 Unspecified asthma with (acute) exacerbation: Principal | ICD-10-CM

## 2013-09-11 DIAGNOSIS — F17200 Nicotine dependence, unspecified, uncomplicated: Secondary | ICD-10-CM | POA: Insufficient documentation

## 2013-09-11 DIAGNOSIS — K219 Gastro-esophageal reflux disease without esophagitis: Secondary | ICD-10-CM | POA: Insufficient documentation

## 2013-09-11 DIAGNOSIS — R05 Cough: Secondary | ICD-10-CM

## 2013-09-11 DIAGNOSIS — Z9981 Dependence on supplemental oxygen: Secondary | ICD-10-CM | POA: Insufficient documentation

## 2013-09-11 DIAGNOSIS — Z792 Long term (current) use of antibiotics: Secondary | ICD-10-CM | POA: Insufficient documentation

## 2013-09-11 DIAGNOSIS — M1990 Unspecified osteoarthritis, unspecified site: Secondary | ICD-10-CM | POA: Insufficient documentation

## 2013-09-11 MED ORDER — BENZONATATE 100 MG PO CAPS: 100.0000 mg | ORAL_CAPSULE | Freq: Three times a day (TID) | ORAL | Status: AC

## 2013-09-11 MED ORDER — OXYCODONE-ACETAMINOPHEN 5-325 MG PO TABS
2.0000 | ORAL_TABLET | Freq: Once | ORAL | Status: AC
  Administered 2013-09-11: 2 via ORAL
  Filled 2013-09-11: qty 2

## 2013-09-11 MED ORDER — ONDANSETRON 4 MG PO TBDP
4.0000 mg | ORAL_TABLET | Freq: Once | ORAL | Status: AC
  Administered 2013-09-11: 4 mg via ORAL
  Filled 2013-09-11: qty 1

## 2013-09-11 NOTE — ED Provider Notes (Signed)
CSN: [id number]     Arrival date & time 09/11/13  1106 History   First MD Initiated Contact with Patient 09/11/13 1235     This chart was scribed for Virgel Manifold, MD by Forrestine Him, ED Scribe. This patient was seen in room APA16A/APA16A and the patient's care was started 12:42 PM.   Chief Complaint  Patient presents with  . Cough   The history is provided by the patient. No language interpreter was used.    HPI Comments: [name] is a 33 y.o. Female with multiple medical problems who presents to the Emergency Department complaining of chest pain x few days. She describes this pain as sharp. She states this pain is similar to what she has experienced in the past.  She also reports SOB with exertion, L lower extremity pain, nausea, and cough.  However, she states this cough and leg pain are not new for her. Denies a history of heart problems. She has not tried anything OTC or prescribed for her discomfort. Pt s currently on oxygen at home. At this time she denies any fever or chills. No other concerns this visit.  Past Medical History  Diagnosis Date  . Fibromyalgia   . Degenerative joint disease   . Hypertension   . Sleep apnea     does not use CPAP, uses oxygen 2L around the clock  . Cancer April 2011    Uterine Cancer  . Diabetes mellitus type II   . Lumbago   . Lack of coordination   . Disturbance of skin sensation   . Chronic pain syndrome   . Pain in limb   . Spinal stenosis, lumbar region, without neurogenic claudication   . Enthesopathy of hip region   . Pain in joint, lower leg   . COPD (chronic obstructive pulmonary disease)   . Asthma   . Restrictive lung disease   . Depression   . Shortness of breath   . PTSD (post-traumatic stress disorder)   . GERD (gastroesophageal reflux disease)    Past Surgical History  Procedure Laterality Date  . Abdominal hysterectomy    . Mouth surgery      Wisdom tooth extracted  . Dilation and curettage of uterus    .  Orthroscopic surgery      Right knee  . Carpel tunnel release surgery      right  . Esophagogastroduodenoscopy (egd) with esophageal dilation  05/10/2012    [id number] dissecans-nonspecific, s/p 4F maloney   Family History  Problem Relation Age of Onset  . Bipolar disorder Brother   . Other Brother     PTSD  . Alcohol abuse Brother   . Bipolar disorder Maternal Aunt   . Anxiety disorder Maternal Aunt   . Paranoid behavior Maternal Aunt   . Colon cancer Neg Hx   . Liver disease Neg Hx   . Depression Neg Hx   . Drug abuse Neg Hx   . Schizophrenia Neg Hx   . Seizures Neg Hx   . Physical abuse Neg Hx   . Sexual abuse Mother   . Dementia Paternal Aunt   . ADD / ADHD Cousin   . ADD / ADHD Cousin   . ADD / ADHD Cousin   . ADD / ADHD Cousin    History  Substance Use Topics  . Smoking status: Current Every Day Smoker -- 0.25 packs/day    Types: Cigarettes    Last Attempt to Quit: 01/02/2011  . Smokeless tobacco: Never Used  .  Alcohol Use: Yes     Comment: occasional wine   OB History   Grav Para Term Preterm Abortions TAB SAB Ect Mult Living                 Review of Systems  HENT: Negative for congestion.   Eyes: Negative for redness.  Respiratory: Positive for cough and shortness of breath.   Cardiovascular: Positive for chest pain.  Gastrointestinal: Positive for nausea.  Psychiatric/Behavioral: Negative for confusion.  All other systems reviewed and are negative.      Allergies  Avocado; Codeine; Flexeril; Marflex; Tramadol; Clonazepam; Contrast media; Cyclobenzaprine; Latex; Other; and Voltaren  Home Medications   Current Outpatient Rx  Name  Route  Sig  Dispense  Refill  . albuterol (PROAIR HFA) 108 (90 BASE) MCG/ACT inhaler   Inhalation   Inhale 2 puffs into the lungs every 6 (six) hours as needed. For rescue         . ALPRAZolam (XANAX) 0.5 MG tablet   Oral   Take 1 tablet (0.5 mg total) by mouth 3 (three) times daily as needed for sleep or  anxiety.   90 tablet   2   . atorvastatin (LIPITOR) 80 MG tablet   Oral   Take 80 mg by mouth every evening.          . budesonide-formoterol (SYMBICORT) 160-4.5 MCG/ACT inhaler   Inhalation   Inhale 2 puffs into the lungs 2 (two) times daily.         . busPIRone (BUSPAR) 15 MG tablet   Oral   Take 1 tablet (15 mg total) by mouth 3 (three) times daily.   90 tablet   2   . DULoxetine (CYMBALTA) 60 MG capsule      Take 60 mg twice a day   60 capsule   2   . estrogens, conjugated, (PREMARIN) 0.45 MG tablet   Oral   Take 0.45 mg by mouth every morning.         . furosemide (LASIX) 40 MG tablet   Oral   Take 40 mg by mouth daily.          Marland Kitchen GLIPIZIDE XL 5 MG 24 hr tablet   Oral   Take 5 mg by mouth daily.          Marland Kitchen lisinopril (PRINIVIL,ZESTRIL) 10 MG tablet   Oral   Take 10 mg by mouth every morning.          . Lurasidone HCl 60 MG TABS   Oral   Take 60 mg by mouth 2 (two) times daily before a meal.   60 tablet   2   . meloxicam (MOBIC) 7.5 MG tablet   Oral   Take 1 tablet (7.5 mg total) by mouth daily. As needed for joint pain   30 tablet   3   . metoprolol (LOPRESSOR) 50 MG tablet   Oral   Take 50 mg by mouth 2 (two) times daily.           Marland Kitchen nystatin (MYCOSTATIN) powder   Topical   Apply 1 application topically Daily. Applied to folds and under breasts as needed for irritation and rash         . omeprazole (PRILOSEC) 20 MG capsule   Oral   Take 1 capsule (20 mg total) by mouth daily.   30 capsule   11   . pioglitazone (ACTOS) 15 MG tablet   Oral   Take 15 mg by  mouth daily.          . pregabalin (LYRICA) 100 MG capsule   Oral   Take 100 mg by mouth 3 (three) times daily. To help with management of pain and anxiety.         . solifenacin (VESICARE) 5 MG tablet   Oral   Take 5 mg by mouth daily.         Marland Kitchen VICTOZA 18 MG/3ML SOLN   Subcutaneous   Inject 1.8 mLs into the skin every evening.          . B-D UF III MINI  PEN NEEDLES 31G X 5 MM MISC                Triage Vitals: BP 113/60  Pulse 98  Temp(Src) 97.8 F (36.6 C) (Oral)  Resp 24  Ht 5\' 2"  (1.575 m)  Wt 387 lb (175.542 kg)  BMI 70.77 kg/m2  SpO2 100%   Physical Exam  Nursing note and vitals reviewed. Constitutional: She is oriented to person, place, and time. She appears well-developed and well-nourished. No distress.  Morbidly obese  HENT:  Head: Normocephalic.  Eyes: EOM are normal.  Neck: Normal range of motion.  Cardiovascular: Normal rate, regular rhythm and normal heart sounds.   No murmur heard. Pulmonary/Chest: Effort normal.  Clear but diminished breath sounds bilaterally  Abdominal: She exhibits no distension.  Musculoskeletal: Normal range of motion.  Neurological: She is alert and oriented to person, place, and time.  Psychiatric: She has a normal mood and affect.    ED Course  Procedures (including critical care time)  DIAGNOSTIC STUDIES: Oxygen Saturation is 100% on RA, Normal by my interpretation.    COORDINATION OF CARE: 12:46 PM- Will order EKG and DG Chest portable 1 view. Discussed treatment plan with pt at bedside and pt agreed to plan.     Labs Review Labs Reviewed - No data to display Imaging Review Dg Chest Portable 1 View  09/11/2013   CLINICAL DATA:  Smoker with chest pain, headache and cough.  EXAM: PORTABLE CHEST - 1 VIEW  COMPARISON:  01/24/2013.  FINDINGS: Poor inspiration. The heart remains grossly normal in size. The lungs are clear through the pulmonary vasculature remains prominent. Unremarkable bones.  IMPRESSION: Stable mild pulmonary vascular congestion.  No acute abnormality.   Electronically Signed   By: Enrique Sack M.D.   On: 09/11/2013 11:49     EKG Interpretation   Date/Time:  Sunday September 11 2013 11:23:46 EDT Ventricular Rate:  98 PR Interval:  134 QRS Duration: 92 QT Interval:  338 QTC Calculation: 431 R Axis:   38 Text Interpretation:  Normal sinus rhythm Nonspecific  ST abnormality When  compared with ECG of 24-Jan-2013 04:29, No significant change was found  Confirmed by Hridaan Bouse  MD, Brelee Renk ([id number]) on 09/11/2013 12:54:36 PM      MDM   Final diagnoses:  Chest pain  Cough    33yF with CP and cough. Atypical for ACS. EKG not significantly changed from last. CXR w/o acute finding.HD stable. o2 sats fine on oxygen which pt uses chronically. Multiple other complaints (cough, HA, etc). Maybe be viral illness. Does endorse leg pain, but also pain in multiple other areas and chronic. Plan symptomatic tx. Return precautions discussed.  I personally preformed the services scribed in my presence. The recorded information has been reviewed is accurate. Virgel Manifold, MD.    Virgel Manifold, MD 09/15/13 [phone]

## 2013-09-11 NOTE — ED Notes (Signed)
Pt complain of cough and headache. States her chest hurts from coughing. Denies other symptoms

## 2013-09-11 NOTE — ED Notes (Signed)
Pt c/o right side chest pain that is worse with coughing and nausea, comfort measures provided,

## 2013-09-11 NOTE — ED Notes (Signed)
Dr Kohut at bedside speaking with pt,  

## 2013-09-11 NOTE — Discharge Instructions (Signed)
Chest Pain (Nonspecific) °It is often hard to give a specific diagnosis for the cause of chest pain. There is always a chance that your pain could be related to something serious, such as a heart attack or a blood clot in the lungs. You need to follow up with your caregiver for further evaluation. °CAUSES  °· Heartburn. °· Pneumonia or bronchitis. °· Anxiety or stress. °· Inflammation around your heart (pericarditis) or lung (pleuritis or pleurisy). °· A blood clot in the lung. °· A collapsed lung (pneumothorax). It can develop suddenly on its own (spontaneous pneumothorax) or from injury (trauma) to the chest. °· Shingles infection (herpes zoster virus). °The chest wall is composed of bones, muscles, and cartilage. Any of these can be the source of the pain. °· The bones can be bruised by injury. °· The muscles or cartilage can be strained by coughing or overwork. °· The cartilage can be affected by inflammation and become sore (costochondritis). °DIAGNOSIS  °Lab tests or other studies, such as X-rays, electrocardiography, stress testing, or cardiac imaging, may be needed to find the cause of your pain.  °TREATMENT  °· Treatment depends on what may be causing your chest pain. Treatment may include: °· Acid blockers for heartburn. °· Anti-inflammatory medicine. °· Pain medicine for inflammatory conditions. °· Antibiotics if an infection is present. °· You may be advised to change lifestyle habits. This includes stopping smoking and avoiding alcohol, caffeine, and chocolate. °· You may be advised to keep your head raised (elevated) when sleeping. This reduces the chance of acid going backward from your stomach into your esophagus. °· Most of the time, nonspecific chest pain will improve within 2 to 3 days with rest and mild pain medicine. °HOME CARE INSTRUCTIONS  °· If antibiotics were prescribed, take your antibiotics as directed. Finish them even if you start to feel better. °· For the next few days, avoid physical  activities that bring on chest pain. Continue physical activities as directed. °· Do not smoke. °· Avoid drinking alcohol. °· Only take over-the-counter or prescription medicine for pain, discomfort, or fever as directed by your caregiver. °· Follow your caregiver's suggestions for further testing if your chest pain does not go away. °· Keep any follow-up appointments you made. If you do not go to an appointment, you could develop lasting (chronic) problems with pain. If there is any problem keeping an appointment, you must call to reschedule. °SEEK MEDICAL CARE IF:  °· You think you are having problems from the medicine you are taking. Read your medicine instructions carefully. °· Your chest pain does not go away, even after treatment. °· You develop a rash with blisters on your chest. °SEEK IMMEDIATE MEDICAL CARE IF:  °· You have increased chest pain or pain that spreads to your arm, neck, jaw, back, or abdomen. °· You develop shortness of breath, an increasing cough, or you are coughing up blood. °· You have severe back or abdominal pain, feel nauseous, or vomit. °· You develop severe weakness, fainting, or chills. °· You have a fever. °THIS IS AN EMERGENCY. Do not wait to see if the pain will go away. Get medical help at once. Call your local emergency services (911 in [place] Do not drive yourself to the hospital. °MAKE SURE YOU:  °· Understand these instructions. °· Will watch your condition. °· Will get help right away if you are not doing well or get worse. °Document Released: 03/12/2005 Document Revised: 08/25/2011 Document Reviewed: 01/06/2008 °[employer]® Patient Information ©2014 [employer].  Cough, Adult  A cough is a reflex that helps clear your throat and airways. It can help heal the body or may be a reaction to an irritated airway. A cough may only last 2 or 3 weeks (acute) or may last more than 8 weeks (chronic).  CAUSES Acute cough:  Viral or bacterial infections. Chronic  cough:  Infections.  Allergies.  Asthma.  Post-nasal drip.  Smoking.  Heartburn or acid reflux.  Some medicines.  Chronic lung problems (COPD).  Cancer. SYMPTOMS   Cough.  Fever.  Chest pain.  Increased breathing rate.  High-pitched whistling sound when breathing (wheezing).  Colored mucus that you cough up (sputum). TREATMENT   A bacterial cough may be treated with antibiotic medicine.  A viral cough must run its course and will not respond to antibiotics.  Your caregiver may recommend other treatments if you have a chronic cough. HOME CARE INSTRUCTIONS   Only take over-the-counter or prescription medicines for pain, discomfort, or fever as directed by your caregiver. Use cough suppressants only as directed by your caregiver.  Use a cold steam vaporizer or humidifier in your bedroom or home to help loosen secretions.  Sleep in a semi-upright position if your cough is worse at night.  Rest as needed.  Stop smoking if you smoke. SEEK IMMEDIATE MEDICAL CARE IF:   You have pus in your sputum.  Your cough starts to worsen.  You cannot control your cough with suppressants and are losing sleep.  You begin coughing up blood.  You have difficulty breathing.  You develop pain which is getting worse or is uncontrolled with medicine.  You have a fever. MAKE SURE YOU:   Understand these instructions.  Will watch your condition.  Will get help right away if you are not doing well or get worse. Document Released: 11/29/2010 Document Revised: 08/25/2011 Document Reviewed: 11/29/2010 [facility] Patient Information 2014 [place].

## 2013-09-12 ENCOUNTER — Encounter (HOSPITAL_COMMUNITY): Payer: Self-pay | Admitting: Emergency Medicine

## 2013-09-12 ENCOUNTER — Emergency Department (HOSPITAL_COMMUNITY)
Admission: EM | Admit: 2013-09-12 | Discharge: 2013-09-14 | Disposition: E | Payer: Medicare Other | Attending: Emergency Medicine | Admitting: Emergency Medicine

## 2013-09-12 ENCOUNTER — Emergency Department (HOSPITAL_COMMUNITY): Payer: Medicare Other

## 2013-09-12 DIAGNOSIS — Z9981 Dependence on supplemental oxygen: Secondary | ICD-10-CM | POA: Insufficient documentation

## 2013-09-12 DIAGNOSIS — IMO0002 Reserved for concepts with insufficient information to code with codable children: Secondary | ICD-10-CM | POA: Insufficient documentation

## 2013-09-12 DIAGNOSIS — I10 Essential (primary) hypertension: Secondary | ICD-10-CM | POA: Insufficient documentation

## 2013-09-12 DIAGNOSIS — G894 Chronic pain syndrome: Secondary | ICD-10-CM | POA: Insufficient documentation

## 2013-09-12 DIAGNOSIS — F329 Major depressive disorder, single episode, unspecified: Secondary | ICD-10-CM | POA: Insufficient documentation

## 2013-09-12 DIAGNOSIS — J4489 Other specified chronic obstructive pulmonary disease: Secondary | ICD-10-CM | POA: Insufficient documentation

## 2013-09-12 DIAGNOSIS — I469 Cardiac arrest, cause unspecified: Secondary | ICD-10-CM | POA: Insufficient documentation

## 2013-09-12 DIAGNOSIS — F3289 Other specified depressive episodes: Secondary | ICD-10-CM | POA: Insufficient documentation

## 2013-09-12 DIAGNOSIS — G4730 Sleep apnea, unspecified: Secondary | ICD-10-CM | POA: Insufficient documentation

## 2013-09-12 DIAGNOSIS — M1990 Unspecified osteoarthritis, unspecified site: Secondary | ICD-10-CM | POA: Insufficient documentation

## 2013-09-12 DIAGNOSIS — IMO0001 Reserved for inherently not codable concepts without codable children: Secondary | ICD-10-CM | POA: Insufficient documentation

## 2013-09-12 DIAGNOSIS — F17200 Nicotine dependence, unspecified, uncomplicated: Secondary | ICD-10-CM | POA: Insufficient documentation

## 2013-09-12 DIAGNOSIS — Z79899 Other long term (current) drug therapy: Secondary | ICD-10-CM | POA: Insufficient documentation

## 2013-09-12 DIAGNOSIS — Z8542 Personal history of malignant neoplasm of other parts of uterus: Secondary | ICD-10-CM | POA: Insufficient documentation

## 2013-09-12 DIAGNOSIS — K219 Gastro-esophageal reflux disease without esophagitis: Secondary | ICD-10-CM | POA: Insufficient documentation

## 2013-09-12 DIAGNOSIS — J449 Chronic obstructive pulmonary disease, unspecified: Secondary | ICD-10-CM | POA: Insufficient documentation

## 2013-09-12 DIAGNOSIS — F4310 Post-traumatic stress disorder, unspecified: Secondary | ICD-10-CM | POA: Insufficient documentation

## 2013-09-12 DIAGNOSIS — Z91040 Latex allergy status: Secondary | ICD-10-CM | POA: Insufficient documentation

## 2013-09-12 DIAGNOSIS — E119 Type 2 diabetes mellitus without complications: Secondary | ICD-10-CM | POA: Insufficient documentation

## 2013-09-12 LAB — CBG MONITORING, ED: Glucose-Capillary: 242 mg/dL — ABNORMAL HIGH (ref 70–99)

## 2013-09-12 MED ORDER — NALOXONE HCL 1 MG/ML IJ SOLN
INTRAMUSCULAR | Status: AC
  Filled 2013-09-12: qty 2

## 2013-09-12 MED ORDER — SODIUM CHLORIDE 0.9 % IV SOLN
INTRAVENOUS | Status: AC | PRN
  Administered 2013-09-12: 1000 mL via INTRAVENOUS

## 2013-09-12 MED ORDER — EPINEPHRINE HCL 0.1 MG/ML IJ SOSY
PREFILLED_SYRINGE | INTRAMUSCULAR | Status: AC
  Filled 2013-09-12: qty 10

## 2013-09-12 MED ORDER — LIDOCAINE HCL (CARDIAC) 20 MG/ML IV SOLN
INTRAVENOUS | Status: AC
  Filled 2013-09-12: qty 5

## 2013-09-12 MED ORDER — EPINEPHRINE HCL 0.1 MG/ML IJ SOSY
PREFILLED_SYRINGE | INTRAMUSCULAR | Status: AC | PRN
  Administered 2013-09-12 (×7): 1 mg via INTRAVENOUS

## 2013-09-12 MED ORDER — ROCURONIUM BROMIDE 50 MG/5ML IV SOLN
INTRAVENOUS | Status: DC
Start: 2013-09-12 — End: 2013-09-12
  Filled 2013-09-12: qty 2

## 2013-09-12 MED ORDER — ETOMIDATE 2 MG/ML IV SOLN
INTRAVENOUS | Status: AC
  Filled 2013-09-12: qty 20

## 2013-09-12 MED ORDER — SUCCINYLCHOLINE CHLORIDE 20 MG/ML IJ SOLN
INTRAMUSCULAR | Status: AC | PRN
  Administered 2013-09-12: 5 mg via INTRAVENOUS

## 2013-09-12 MED ORDER — ETOMIDATE 2 MG/ML IV SOLN
INTRAVENOUS | Status: AC | PRN
  Administered 2013-09-12: 20 mg via INTRAVENOUS

## 2013-09-12 MED ORDER — SUCCINYLCHOLINE CHLORIDE 20 MG/ML IJ SOLN
INTRAMUSCULAR | Status: AC
  Filled 2013-09-12: qty 1

## 2013-09-12 MED ORDER — NALOXONE HCL 1 MG/ML IJ SOLN
INTRAMUSCULAR | Status: AC | PRN
  Administered 2013-09-12: 1 mg via INTRAMUSCULAR

## 2013-09-13 MED FILL — Medication: Qty: 1 | Status: AC

## 2013-09-14 ENCOUNTER — Ambulatory Visit (HOSPITAL_COMMUNITY): Payer: Self-pay | Admitting: Psychiatry

## 2013-09-14 NOTE — Code Documentation (Signed)
Pulse check with doppler-none present, CPR continued. PEA on monitor.

## 2013-09-14 NOTE — ED Notes (Signed)
Patient being transferred to morgue.

## 2013-09-14 NOTE — ED Provider Notes (Signed)
CSN: [id number]     Arrival date & time September 16, 2013  0822 History  This chart was scribed for Charles B. Karle Starch, MD by Roe Coombs, ED Scribe. The patient was seen in room APA01/APA01. Patient's care was started at 8:20 AM.   Chief Complaint  Patient presents with  . Altered Mental Status    The history is provided by the EMS personnel and a parent. The history is limited by the condition of the patient. No language interpreter was used.    Level 5 Caveat - Complete history could not be obtained due to patient's altered mental status (unresponsive) HPI Comments: [name] is a 33 y.o. female who presents to the Emergency Department via EMS with altered mental status. Per EMS, patient's family called 911 after they found her unconscious and were unable to arouse her for a period of 30 minutes. They are not sure how long she was unconscious prior to their arrival. EMS personnel report that when they got to patient's house, her breathing was agonal and it was difficult for them to palpate a pulse or obtain a blood pressure, but her blood pressure was around 40/20 and CBG was 292. She was seen here in the ED yesterday complaining of chest pain and cough. Her chest x-ray was unremarkable for acute diseaseand she was given Zofran and Percocet in the ED and discharged home with a prescription for 21 capsules of Tessalon. Patient has an extensive medical history including depression, uterine cancer, DM, GERD, arthritis, COPD, sleep apnea (on O2 2L), morbid obesity.  Past Medical History  Diagnosis Date  . Fibromyalgia   . Degenerative joint disease   . Hypertension   . Sleep apnea     does not use CPAP, uses oxygen 2L around the clock  . Cancer April 2011    Uterine Cancer  . Diabetes mellitus type II   . Lumbago   . Lack of coordination   . Disturbance of skin sensation   . Chronic pain syndrome   . Pain in limb   . Spinal stenosis, lumbar region, without neurogenic claudication   .  Enthesopathy of hip region   . Pain in joint, lower leg   . COPD (chronic obstructive pulmonary disease)   . Asthma   . Restrictive lung disease   . Depression   . Shortness of breath   . PTSD (post-traumatic stress disorder)   . GERD (gastroesophageal reflux disease)    Past Surgical History  Procedure Laterality Date  . Abdominal hysterectomy    . Mouth surgery      Wisdom tooth extracted  . Dilation and curettage of uterus    . Orthroscopic surgery      Right knee  . Carpel tunnel release surgery      right  . Esophagogastroduodenoscopy (egd) with esophageal dilation  05/10/2012    [id number] dissecans-nonspecific, s/p 71F maloney   Family History  Problem Relation Age of Onset  . Bipolar disorder Brother   . Other Brother     PTSD  . Alcohol abuse Brother   . Bipolar disorder Maternal Aunt   . Anxiety disorder Maternal Aunt   . Paranoid behavior Maternal Aunt   . Colon cancer Neg Hx   . Liver disease Neg Hx   . Depression Neg Hx   . Drug abuse Neg Hx   . Schizophrenia Neg Hx   . Seizures Neg Hx   . Physical abuse Neg Hx   . Sexual abuse Mother   .  Dementia Paternal Aunt   . ADD / ADHD Cousin   . ADD / ADHD Cousin   . ADD / ADHD Cousin   . ADD / ADHD Cousin    History  Substance Use Topics  . Smoking status: Current Every Day Smoker -- 0.25 packs/day    Types: Cigarettes    Last Attempt to Quit: 01/02/2011  . Smokeless tobacco: Never Used  . Alcohol Use: Yes     Comment: occasional wine   OB History   Grav Para Term Preterm Abortions TAB SAB Ect Mult Living                 Review of Systems Level 5 Caveat - Unable to obtain complete full review of systems due to patient's altered mental status (unresponsive).   Allergies  Avocado; Codeine; Flexeril; Marflex; Tramadol; Clonazepam; Contrast media; Cyclobenzaprine; Latex; Other; and Voltaren  Home Medications   Current Outpatient Rx  Name  Route  Sig  Dispense  Refill  . albuterol (PROAIR  HFA) 108 (90 BASE) MCG/ACT inhaler   Inhalation   Inhale 2 puffs into the lungs every 6 (six) hours as needed. For rescue         . ALPRAZolam (XANAX) 0.5 MG tablet   Oral   Take 1 tablet (0.5 mg total) by mouth 3 (three) times daily as needed for sleep or anxiety.   90 tablet   2   . atorvastatin (LIPITOR) 80 MG tablet   Oral   Take 80 mg by mouth every evening.          . B-D UF III MINI PEN NEEDLES 31G X 5 MM MISC               . benzonatate (TESSALON) 100 MG capsule   Oral   Take 1 capsule (100 mg total) by mouth every 8 (eight) hours.   21 capsule   0   . budesonide-formoterol (SYMBICORT) 160-4.5 MCG/ACT inhaler   Inhalation   Inhale 2 puffs into the lungs 2 (two) times daily.         . busPIRone (BUSPAR) 15 MG tablet   Oral   Take 1 tablet (15 mg total) by mouth 3 (three) times daily.   90 tablet   2   . DULoxetine (CYMBALTA) 60 MG capsule      Take 60 mg twice a day   60 capsule   2   . estrogens, conjugated, (PREMARIN) 0.45 MG tablet   Oral   Take 0.45 mg by mouth every morning.         . furosemide (LASIX) 40 MG tablet   Oral   Take 40 mg by mouth daily.          Marland Kitchen GLIPIZIDE XL 5 MG 24 hr tablet   Oral   Take 5 mg by mouth daily.          Marland Kitchen lisinopril (PRINIVIL,ZESTRIL) 10 MG tablet   Oral   Take 10 mg by mouth every morning.          . Lurasidone HCl 60 MG TABS   Oral   Take 60 mg by mouth 2 (two) times daily before a meal.   60 tablet   2   . meloxicam (MOBIC) 7.5 MG tablet   Oral   Take 1 tablet (7.5 mg total) by mouth daily. As needed for joint pain   30 tablet   3   . metoprolol (LOPRESSOR) 50 MG tablet  Oral   Take 50 mg by mouth 2 (two) times daily.           Marland Kitchen nystatin (MYCOSTATIN) powder   Topical   Apply 1 application topically Daily. Applied to folds and under breasts as needed for irritation and rash         . omeprazole (PRILOSEC) 20 MG capsule   Oral   Take 1 capsule (20 mg total) by mouth  daily.   30 capsule   11   . pioglitazone (ACTOS) 15 MG tablet   Oral   Take 15 mg by mouth daily.          . pregabalin (LYRICA) 100 MG capsule   Oral   Take 100 mg by mouth 3 (three) times daily. To help with management of pain and anxiety.         . solifenacin (VESICARE) 5 MG tablet   Oral   Take 5 mg by mouth daily.         Marland Kitchen VICTOZA 18 MG/3ML SOLN   Subcutaneous   Inject 1.8 mLs into the skin every evening.            Physical Exam  Nursing note and vitals reviewed. Constitutional:  Obese.  HENT:  Head: Normocephalic and atraumatic.  Neck: Neck supple.  Cardiovascular:  Pulseless on arriva  Pulmonary/Chest:  Apneic on arrival  Abdominal: Soft.  Musculoskeletal: She exhibits no edema.  Neurological:  Unable to assess    ED Course  Procedures (including critical care time)  COORDINATION OF CARE: 8:23 AM - CPR started.  INTUBATION Performed by: Truddie Hidden  Required items: required blood products, implants, devices, and special equipment available Patient identity confirmed: provided demographic data and hospital-assigned identification number Time out: Immediately prior to procedure a "time out" was called to verify the correct patient, procedure, equipment, support staff and site/side marked as required.  Indications: cardiac arrest  Intubation method: Glidescope Laryngoscopy and direct laryngoscopy  Preoxygenation: BVM  Sedatives: Etomidate Paralytic: Succinylcholine  Tube Size: 7.0 cuffed  Post-procedure assessment: chest rise and ETCO2 monitor Breath sounds: equal and absent over the epigastrium Tube secured with: ETT holder Chest x-ray interpreted by radiologist and me.  Chest x-ray findings: N/A    Cardiopulmonary Resuscitation (CPR) Procedure Note Directed/Performed by: Mercie Eon. Karle Starch, MD I personally directed ancillary staff and/or performed CPR in an effort to regain return of spontaneous circulation and to  maintain cardiac, neuro and systemic perfusion.  8:25 AM - IV fluids 1000 ml started in left arm.   8:26 AM - Epinephrine 1 mg given  8:27 AM - Etomidate 20 mg given.  8:28 AM - Succinylcholine 5 mg given.  8:34 AM  - Epinephrine 1 mg given.   8:37 AM - Difficult intubation, anterior cords. No view with glidescope, able to see cords with 4 Mac blade, but unable to pass tube initially. Used bougie to intubate. Color change noted  Performed intubation with Bougie stylet and continued compressions. Narcan 1 mg given.  8:39 AM - Epinephrine 1 mg given.  8:41 AM - Ordered portable CXR. Pulse was checked with Doppler and none found. CPR was continued. PEA on monitor.  8:44 AM - Epinephrine 1 mg given. Pulse 83 and BP 115/98. Patient still unresponsive.   8:48 AM - Epinephrine 1 mg given.  8:50 AM - Spoke with patient's family. No concern on their part for overdose. They say she slept most of the day yesterday. Took her meds at 4pm including one  of the tessalon. Mother states bottle still had 20 pills left. They were unable to arouse her this morning.   8:51 AM - Epinephrine 1 mg given.  8:53 AM - Blood sugar 242.   8:58 AM - Pulse checked again with Doppler and none found, so compressions were continued.  9:01 AM - Epinephrine 1 mg given. 7 mg of epinephrine total given.  9:03 AM - Compressions stopped. Rhythm check attempted with doppler and no cardiac motion detected. Time of Death announced at 9:03 AM.  9:17 AM - Spoke with Dr. Willey Blade, patient's PCP who will sign death certificate. Family also notified and allowed to visit with the body.     Results for orders placed during the hospital encounter of 10-12-2013  CBG MONITORING, ED      Result Value Ref Range   Glucose-Capillary 242 (*) 70 - 99 mg/dL     Imaging Review Dg Chest Portable 1 View  09/11/2013   CLINICAL DATA:  Smoker with chest pain, headache and cough.  EXAM: PORTABLE CHEST - 1 VIEW  COMPARISON:  01/24/2013.   FINDINGS: Poor inspiration. The heart remains grossly normal in size. The lungs are clear through the pulmonary vasculature remains prominent. Unremarkable bones.  IMPRESSION: Stable mild pulmonary vascular congestion.  No acute abnormality.   Electronically Signed   By: Enrique Sack M.D.   On: 09/11/2013 11:49     EKG Interpretation None      MDM   Final diagnoses:  Cardiac arrest    Cardiac arrest, unclear etiology, but I suspect prolonged hypoxia while sleep. Family states she wears chronic O2 and it had come off during the night.   I personally performed the services described in this documentation, which was scribed in my presence. The recorded information has been reviewed and is accurate.      Charles B. Karle Starch, MD October 12, 2013 1434

## 2013-09-14 NOTE — Code Documentation (Signed)
Patient time of death occurred at 10.

## 2013-09-14 NOTE — ED Notes (Signed)
Family out of room, eye care performed.

## 2013-09-14 NOTE — ED Notes (Signed)
Family brought to patient's bedside.

## 2013-09-14 NOTE — ED Notes (Signed)
Patient brought in via EMS, unresponsive. Per EMS patient's family unable to arouse patient this morning at for 30 minutes. Per EMS patient having agonal breathing, blood sugar 292. CPR started upon arrival to ED at 0823.

## 2013-09-14 NOTE — ED Notes (Signed)
[facility] contacted-spoke with [facility] Pleas Koch.

## 2013-09-14 NOTE — Code Documentation (Signed)
Family updated as to patient's status.

## 2013-09-14 DEATH — deceased

## 2013-10-14 DEATH — deceased

## 2014-01-18 IMAGING — CR DG ABDOMEN ACUTE W/ 1V CHEST
4 series · 4 of 4 positions shown · non-contrast
Comparison: 08/13/2011

CLINICAL DATA: Epigastric pain and chest pain

ACUTE ABDOMEN SERIES (ABDOMEN 2 VIEW & CHEST 1 VIEW)

[view not recorded (1 of 4)]
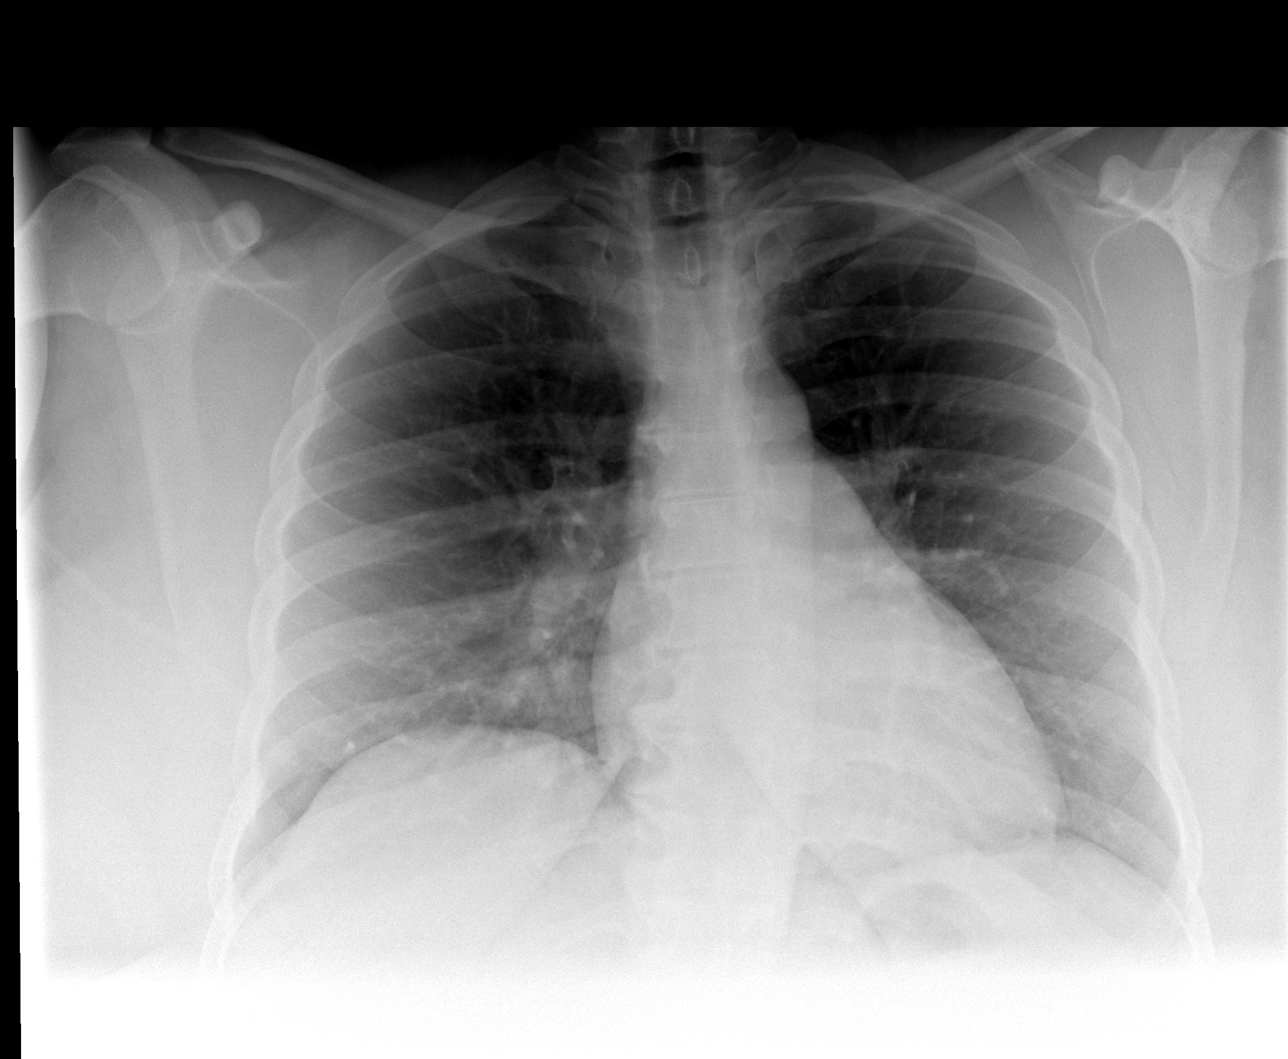

[view not recorded (2 of 4)]
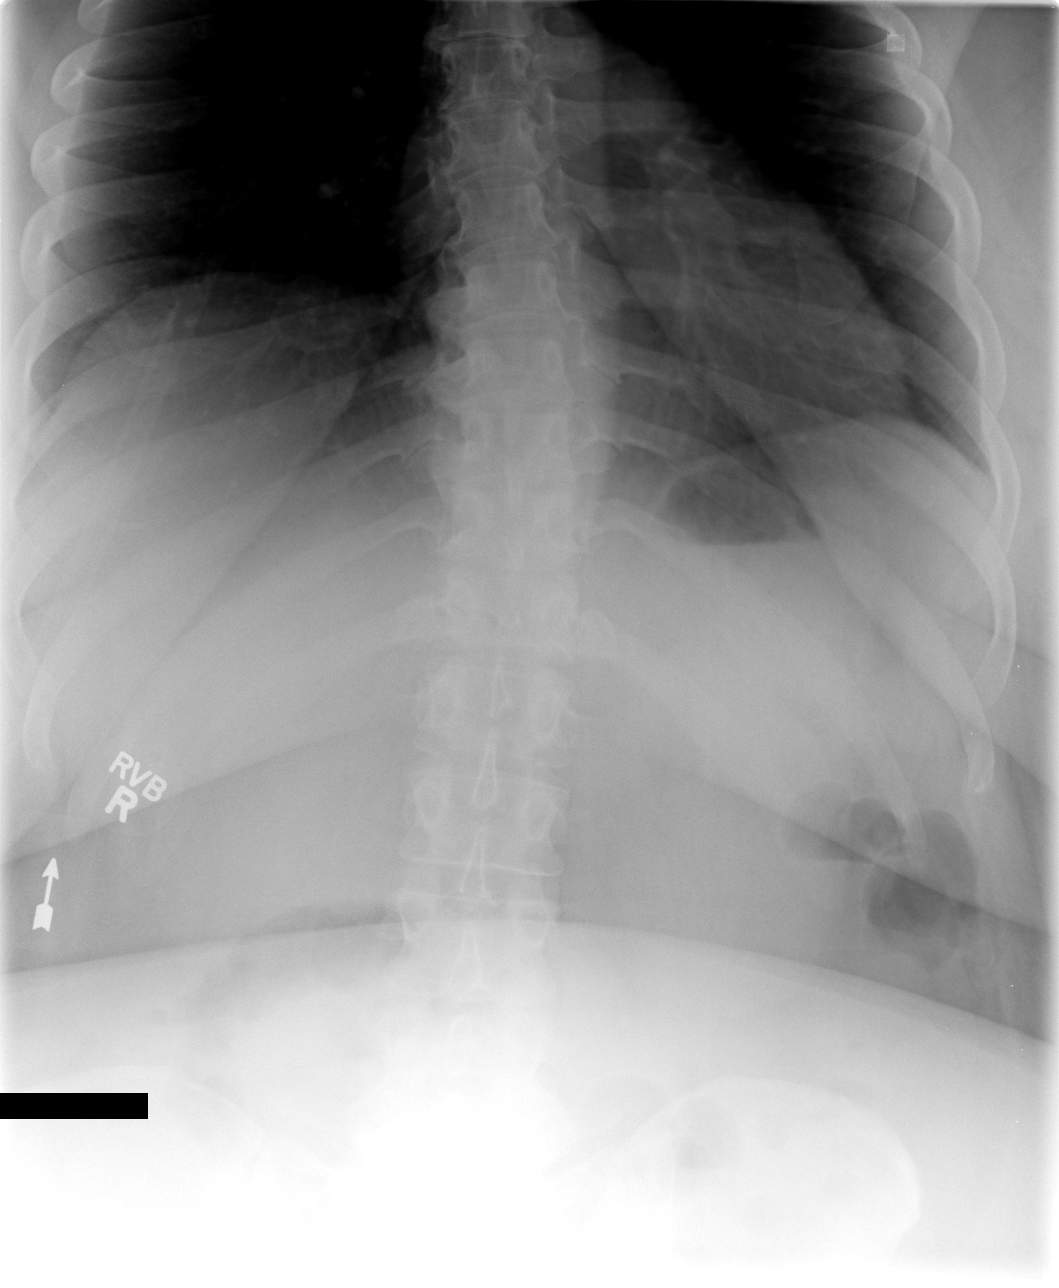

[view not recorded (3 of 4)]
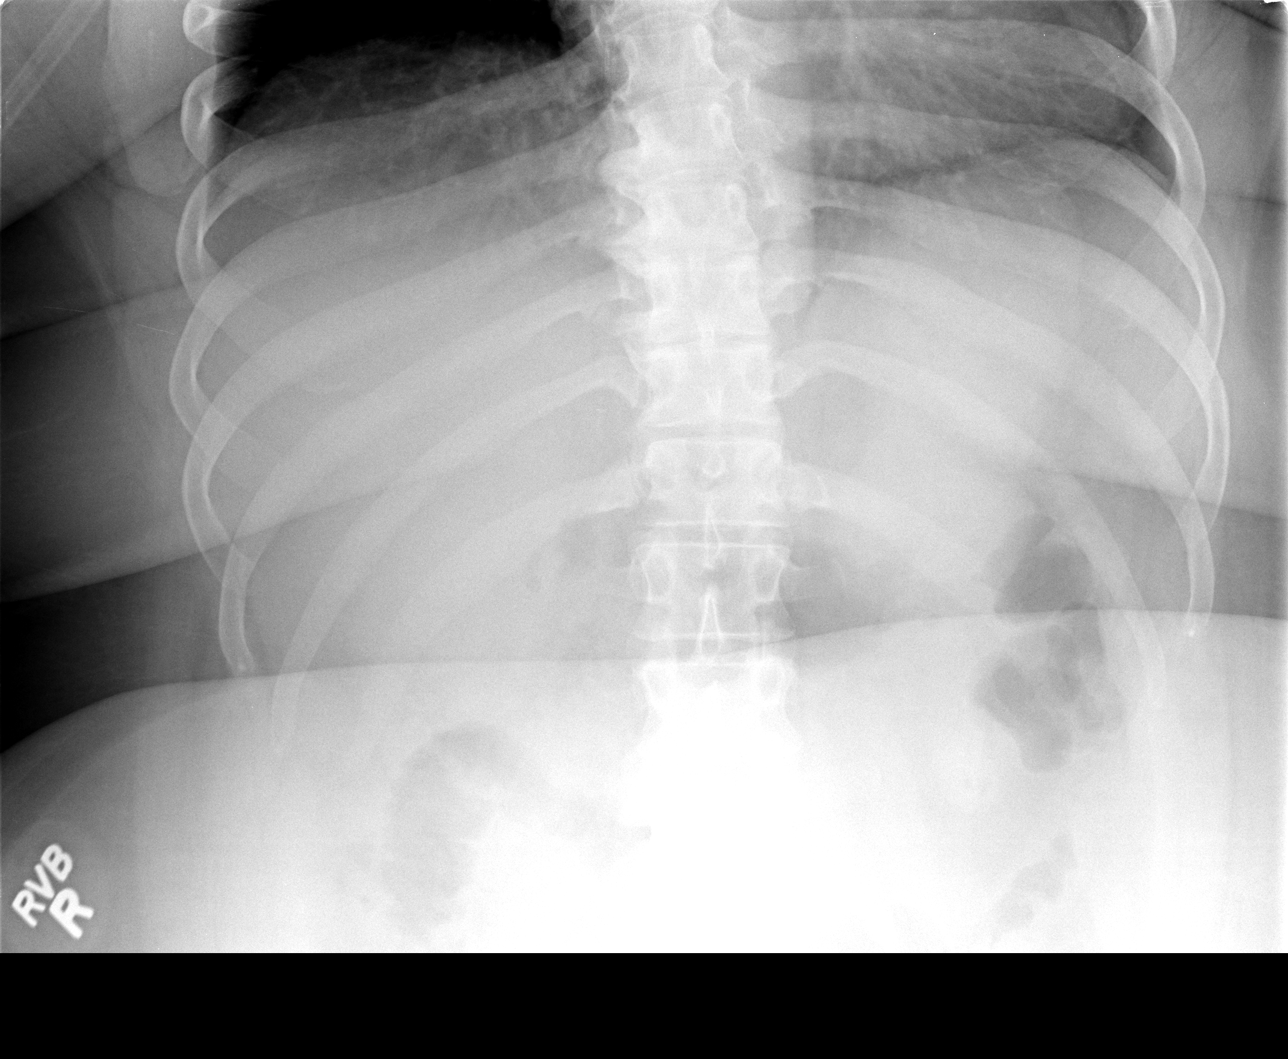

[view not recorded (4 of 4)]
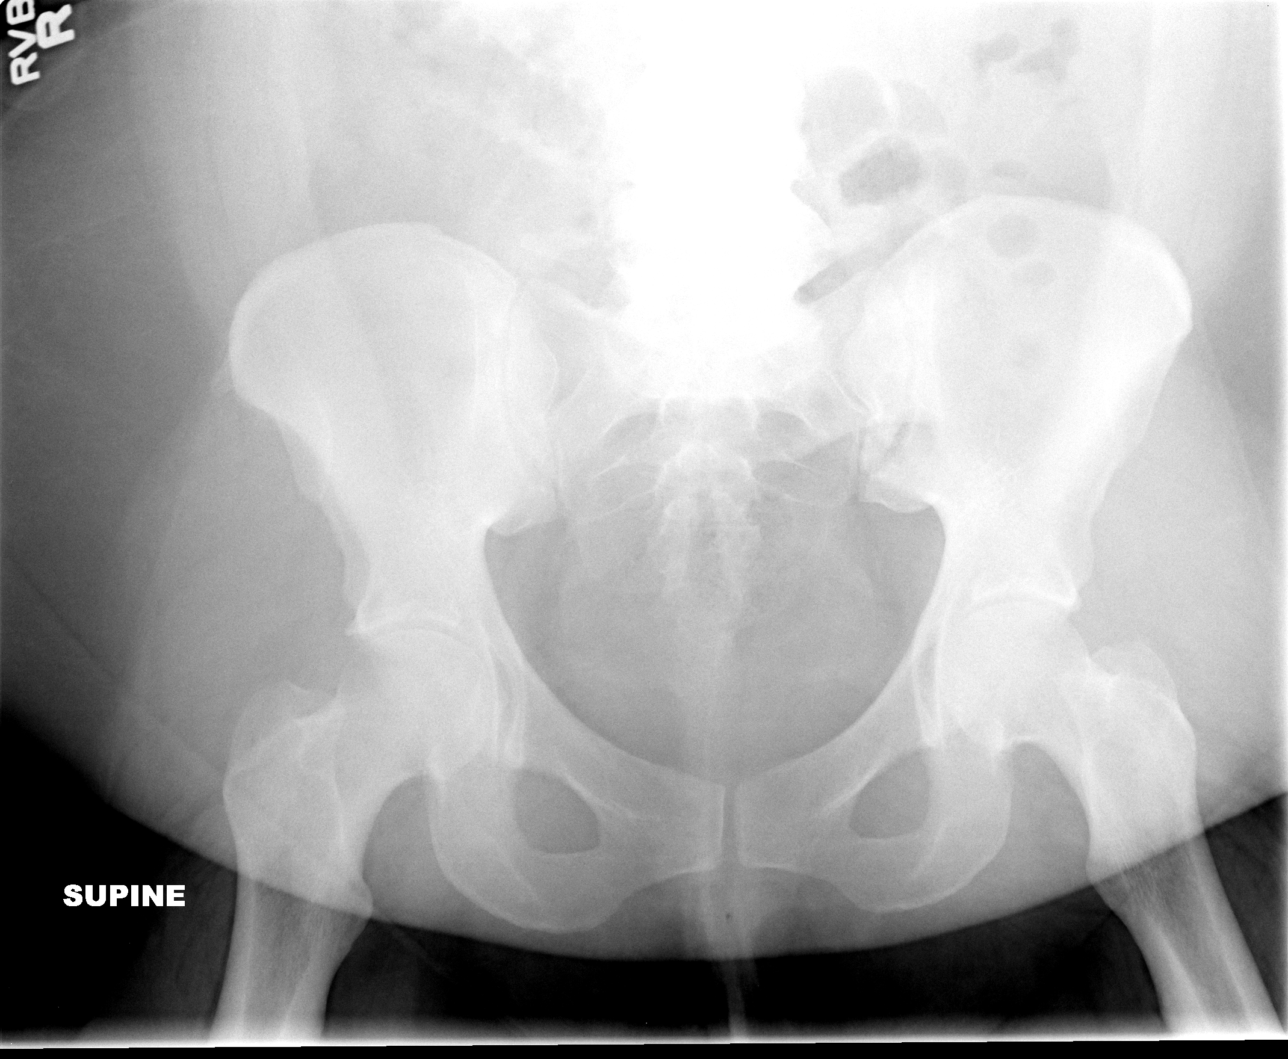

[4 of 4 positions shown; findings below may reference images not displayed]

FINDINGS: The heart size and mediastinal contours are within normal
limits.  Both lungs are clear.  The visualized skeletal structures
are unremarkable.  The bowel gas pattern is nonspecific.  Within
the right lower quadrant of the abdomen there is an air-filled loop
of small bowel which measures up to 3.2 mm.  Gas is noted within
the colon.  No free intraperitoneal air noted.
IMPRESSION: 1.  Non specific bowel gas pattern.
2.  Right lower quadrant small bowel loops are mildly dilated and
may represent focal ileus or partial obstruction.

## 2014-01-18 IMAGING — CT CT ABD-PELV W/ CM
2 of 4 series · 16 of 46 positions shown, 18 images · IV contrast (Omnipaque 300)
Comparison: 10/18/2009

CLINICAL DATA: Pain

CT ABDOMEN AND PELVIS WITH CONTRAST
TECHNIQUE: Multidetector CT imaging of the abdomen and pelvis was
performed following the standard protocol during bolus
administration of intravenous contrast.
Contrast: 100mL OMNIPAQUE IOHEXOL 300 MG/ML  SOLN

[Series 2: abd_pel_with 5.0 b40s · axial · 0.97mm/px · z∈[-441,-56]mm · 13 of 89 slices shown, 15 images]
[im 6/89  soft-tissue]
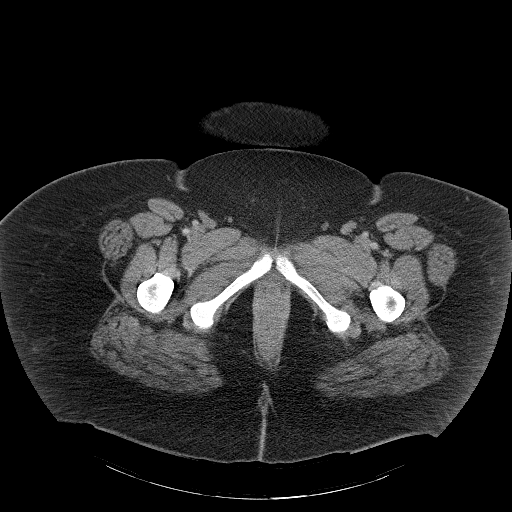
[im 6/89  bone]
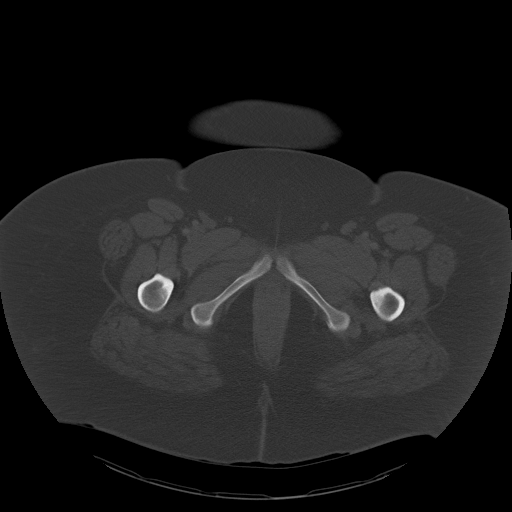
[im 12/89  soft-tissue]
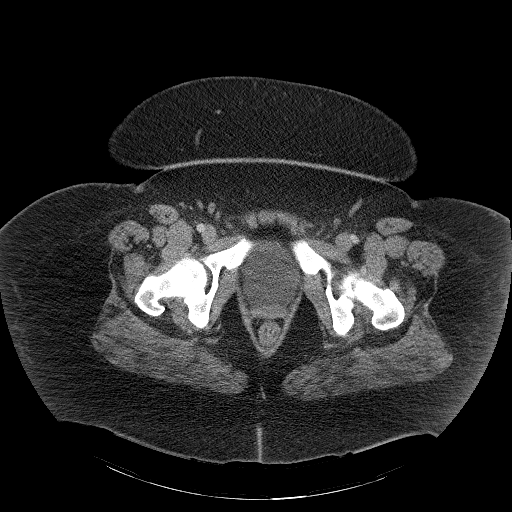
[im 18/89  soft-tissue]
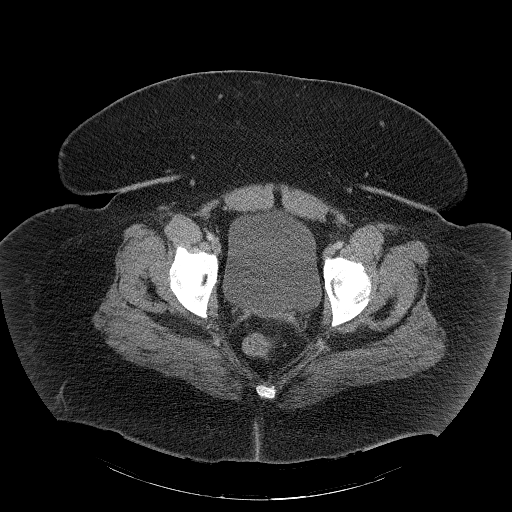
[im 24/89  soft-tissue]
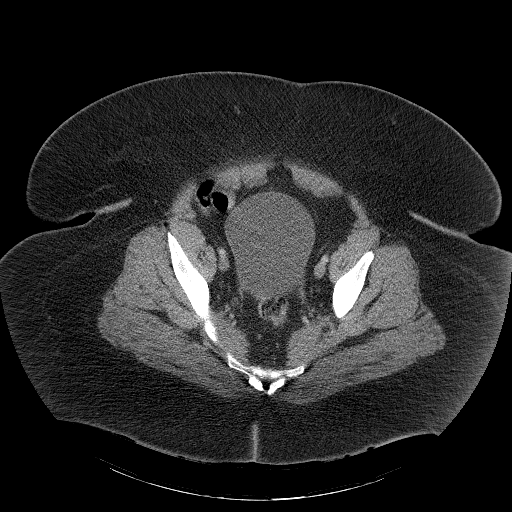
[im 30/89  soft-tissue]
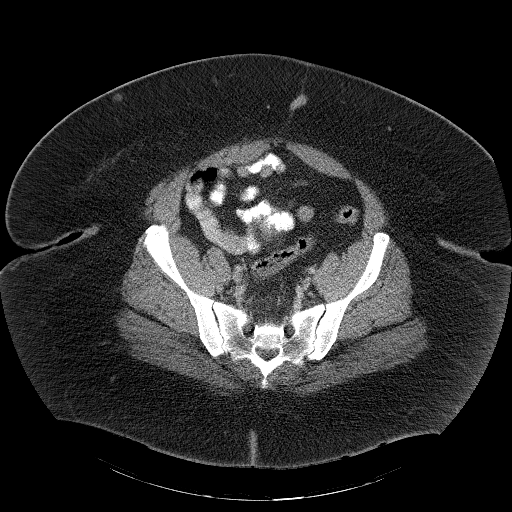
[im 36/89  soft-tissue]
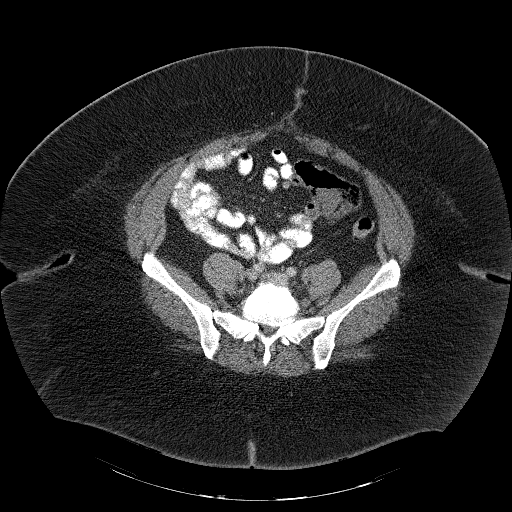
[im 47/89  soft-tissue]
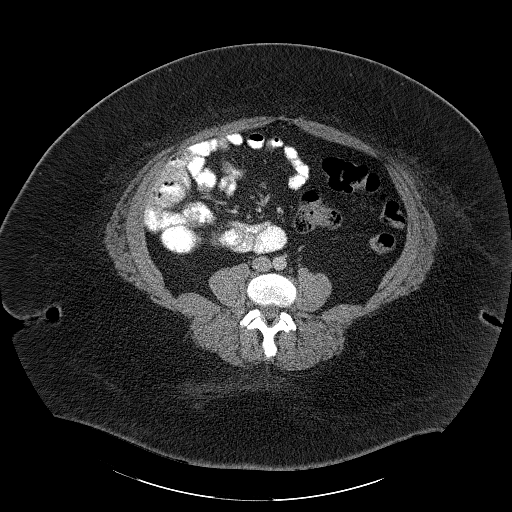
[im 53/89  soft-tissue]
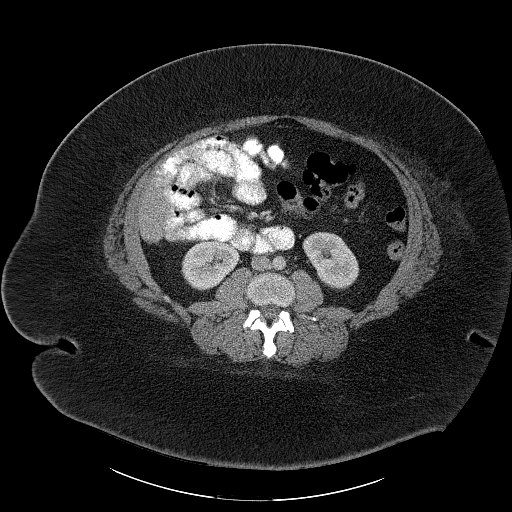
[im 59/89  soft-tissue]
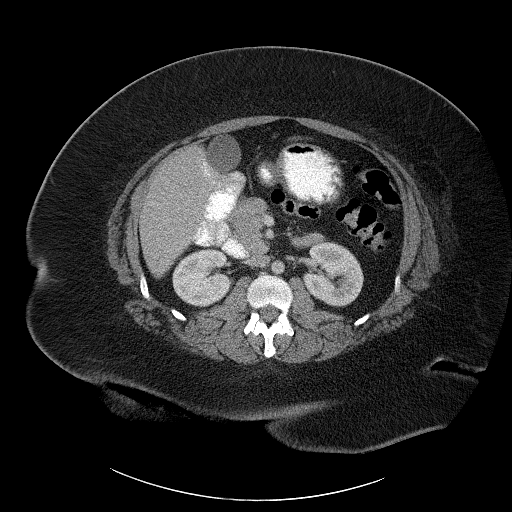
[im 59/89  bone]
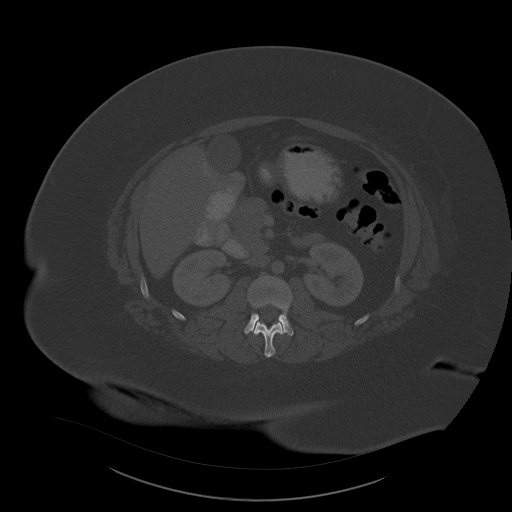
[im 65/89  soft-tissue]
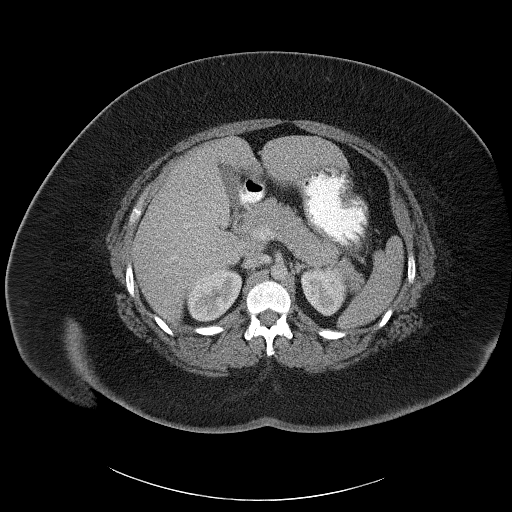
[im 71/89  soft-tissue]
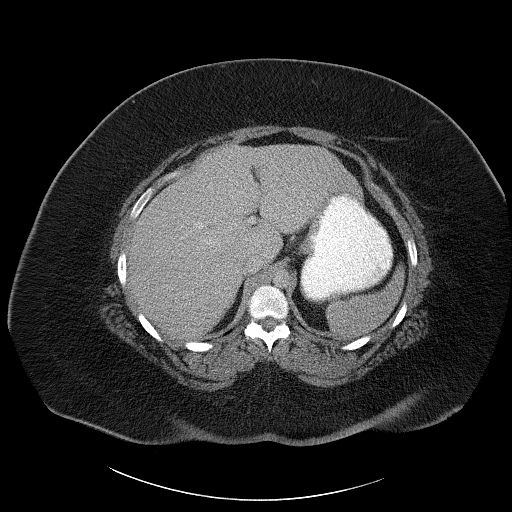
[im 77/89  soft-tissue]
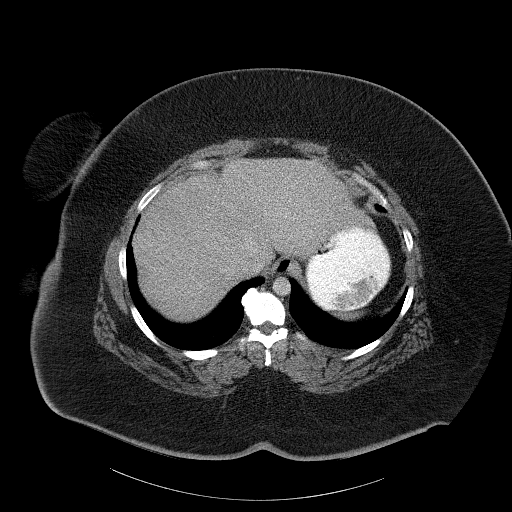
[im 83/89  soft-tissue]
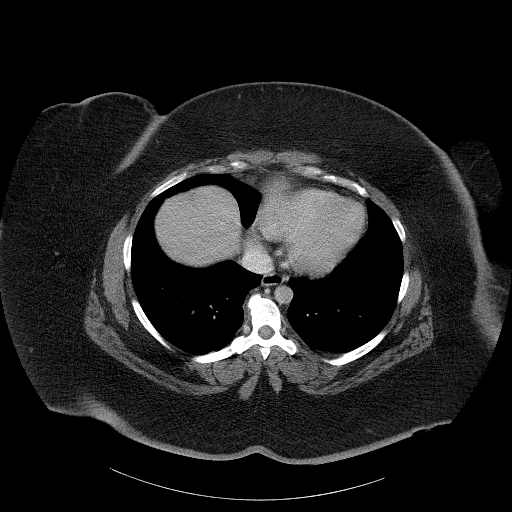

[Series 4: mpr cor post contrast (id) · coronal · 0.88mm/px · 3 of 114 slices shown]
[im 38/114  soft-tissue]
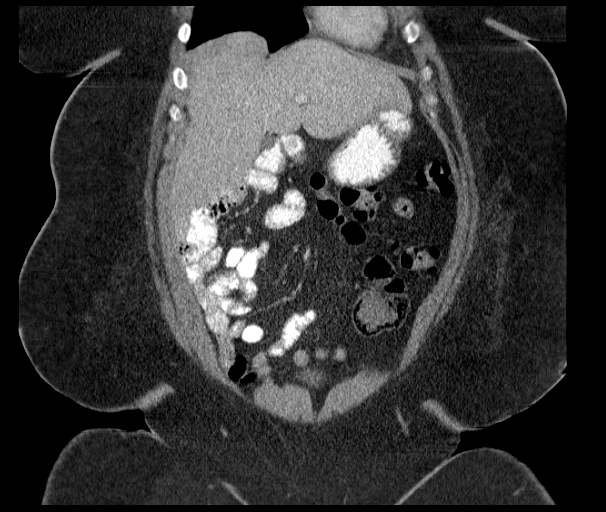
[im 51/114  soft-tissue]
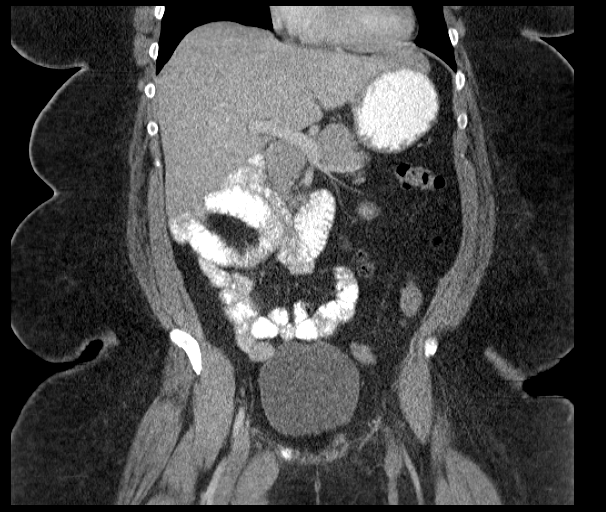
[im 63/114  soft-tissue]
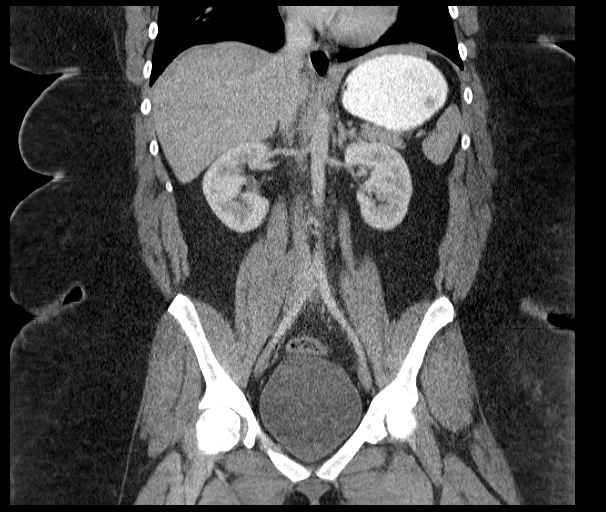

[16 of 46 positions shown; findings below may reference images not displayed]

FINDINGS: The lung bases are clear.  No pericardial or pleural
effusion.  No focal liver abnormalities.  The gallbladder is
normal.  No biliary dilatation.  The pancreas is unremarkable.
Normal appearance of the spleen.

Both adrenal glands are normal.  The right kidney is normal.  The
left kidney is normal.  Normal appearance of the urinary bladder.
No enlarged upper abdominal lymph nodes.  There is no pelvic or
inguinal adenopathy identified.

The stomach is normal.  The small bowel loops are normal in
caliber.  Malrotation deformity is identified without evidence for
mid gut volvulus.  The cecum is noted within the left lower
quadrant of the abdomen, image 54.  The appendix is visualized and
appears within normal limits.  The colon has a normal caliber.

No free fluid or fluid collections noted within the abdomen or
pelvis.

Review visualized osseous structures are significant for
degenerative disc disease within the thoracic and lumbar spine.
IMPRESSION: 1.  No acute findings within the abdomen or pelvis.
2.  Malrotation of the bowel with colon limited to the left side of
the abdomen as before.  No evidence for mid gut volvulus.
